# Patient Record
Sex: Female | Born: 1960 | Race: White | Hispanic: No | Smoking: Current every day smoker
Health system: Southern US, Community
[De-identification: ages and names within clinical notes are randomized; demographics above are authoritative.]

## PROBLEM LIST (undated history)

## (undated) DIAGNOSIS — E7800 Pure hypercholesterolemia, unspecified: Secondary | ICD-10-CM

## (undated) DIAGNOSIS — E119 Type 2 diabetes mellitus without complications: Secondary | ICD-10-CM

## (undated) DIAGNOSIS — F17200 Nicotine dependence, unspecified, uncomplicated: Secondary | ICD-10-CM

## (undated) DIAGNOSIS — I10 Essential (primary) hypertension: Secondary | ICD-10-CM

## (undated) DIAGNOSIS — L732 Hidradenitis suppurativa: Secondary | ICD-10-CM

## (undated) HISTORY — DX: Hidradenitis suppurativa: L73.2

## (undated) HISTORY — DX: Nicotine dependence, unspecified, uncomplicated: F17.200

---

## 2002-12-31 ENCOUNTER — Emergency Department (HOSPITAL_COMMUNITY): Admission: EM | Admit: 2002-12-31 | Discharge: 2002-12-31 | Payer: Self-pay | Admitting: *Deleted

## 2007-02-16 ENCOUNTER — Emergency Department (HOSPITAL_COMMUNITY): Admission: EM | Admit: 2007-02-16 | Discharge: 2007-02-16 | Payer: Self-pay | Admitting: Emergency Medicine

## 2007-04-22 ENCOUNTER — Ambulatory Visit (HOSPITAL_COMMUNITY): Admission: RE | Admit: 2007-04-22 | Discharge: 2007-04-22 | Payer: Self-pay | Admitting: Family Medicine

## 2007-05-07 ENCOUNTER — Ambulatory Visit (HOSPITAL_COMMUNITY): Admission: RE | Admit: 2007-05-07 | Discharge: 2007-05-07 | Payer: Self-pay | Admitting: Family Medicine

## 2008-01-21 ENCOUNTER — Emergency Department (HOSPITAL_COMMUNITY): Admission: EM | Admit: 2008-01-21 | Discharge: 2008-01-21 | Payer: Self-pay | Admitting: Emergency Medicine

## 2008-01-29 ENCOUNTER — Other Ambulatory Visit: Admission: RE | Admit: 2008-01-29 | Discharge: 2008-01-29 | Payer: Self-pay | Admitting: Obstetrics & Gynecology

## 2008-05-17 ENCOUNTER — Ambulatory Visit (HOSPITAL_COMMUNITY): Admission: RE | Admit: 2008-05-17 | Discharge: 2008-05-17 | Payer: Self-pay | Admitting: Obstetrics & Gynecology

## 2009-05-17 ENCOUNTER — Other Ambulatory Visit: Admission: RE | Admit: 2009-05-17 | Discharge: 2009-05-17 | Payer: Self-pay | Admitting: Obstetrics & Gynecology

## 2010-06-26 ENCOUNTER — Other Ambulatory Visit: Admission: RE | Admit: 2010-06-26 | Discharge: 2010-06-26 | Payer: Self-pay | Admitting: Obstetrics & Gynecology

## 2010-10-22 ENCOUNTER — Encounter: Payer: Self-pay | Admitting: Family Medicine

## 2011-06-26 LAB — DIFFERENTIAL
Basophils Absolute: 0
Eosinophils Relative: 2
Lymphocytes Relative: 20
Lymphs Abs: 2.4
Neutro Abs: 8.4 — ABNORMAL HIGH
Neutrophils Relative %: 71

## 2011-06-26 LAB — WET PREP, GENITAL
Trich, Wet Prep: NONE SEEN
Yeast Wet Prep HPF POC: NONE SEEN

## 2011-06-26 LAB — GC/CHLAMYDIA PROBE AMP, GENITAL
Chlamydia, DNA Probe: NEGATIVE
GC Probe Amp, Genital: NEGATIVE

## 2011-06-26 LAB — CBC
HCT: 28.9 — ABNORMAL LOW
Platelets: 509 — ABNORMAL HIGH
WBC: 11.8 — ABNORMAL HIGH

## 2011-06-26 LAB — PREGNANCY, URINE: Preg Test, Ur: NEGATIVE

## 2011-06-29 ENCOUNTER — Other Ambulatory Visit: Payer: Self-pay | Admitting: Obstetrics & Gynecology

## 2011-06-29 ENCOUNTER — Other Ambulatory Visit (HOSPITAL_COMMUNITY)
Admission: RE | Admit: 2011-06-29 | Discharge: 2011-06-29 | Disposition: A | Discharge status: Home / Self Care | Payer: Medicaid Other | Source: Ambulatory Visit | Attending: Obstetrics & Gynecology | Admitting: Obstetrics & Gynecology

## 2011-06-29 DIAGNOSIS — Z01419 Encounter for gynecological examination (general) (routine) without abnormal findings: Secondary | ICD-10-CM | POA: Insufficient documentation

## 2011-07-16 ENCOUNTER — Other Ambulatory Visit: Payer: Self-pay | Admitting: Obstetrics & Gynecology

## 2011-07-16 DIAGNOSIS — Z139 Encounter for screening, unspecified: Secondary | ICD-10-CM

## 2011-07-23 ENCOUNTER — Ambulatory Visit (HOSPITAL_COMMUNITY)
Admission: RE | Admit: 2011-07-23 | Discharge: 2011-07-23 | Disposition: A | Discharge status: Home / Self Care | Payer: Medicaid Other | Source: Ambulatory Visit | Attending: Obstetrics & Gynecology | Admitting: Obstetrics & Gynecology

## 2011-07-23 DIAGNOSIS — Z1231 Encounter for screening mammogram for malignant neoplasm of breast: Secondary | ICD-10-CM | POA: Insufficient documentation

## 2011-07-23 DIAGNOSIS — Z139 Encounter for screening, unspecified: Secondary | ICD-10-CM

## 2013-06-23 ENCOUNTER — Emergency Department (HOSPITAL_COMMUNITY): Payer: Medicaid Other

## 2013-06-23 ENCOUNTER — Encounter (HOSPITAL_COMMUNITY): Payer: Self-pay

## 2013-06-23 ENCOUNTER — Inpatient Hospital Stay (HOSPITAL_COMMUNITY)
Admission: EM | Admit: 2013-06-23 | Discharge: 2013-06-28 | DRG: 603 | Disposition: A | Discharge status: Home / Self Care | Payer: Medicaid Other | Attending: Internal Medicine | Admitting: Internal Medicine

## 2013-06-23 DIAGNOSIS — Z23 Encounter for immunization: Secondary | ICD-10-CM

## 2013-06-23 DIAGNOSIS — E785 Hyperlipidemia, unspecified: Secondary | ICD-10-CM | POA: Diagnosis present

## 2013-06-23 DIAGNOSIS — F17200 Nicotine dependence, unspecified, uncomplicated: Secondary | ICD-10-CM | POA: Diagnosis present

## 2013-06-23 DIAGNOSIS — L03039 Cellulitis of unspecified toe: Secondary | ICD-10-CM | POA: Diagnosis present

## 2013-06-23 DIAGNOSIS — L0390 Cellulitis, unspecified: Secondary | ICD-10-CM

## 2013-06-23 DIAGNOSIS — E119 Type 2 diabetes mellitus without complications: Secondary | ICD-10-CM | POA: Diagnosis present

## 2013-06-23 DIAGNOSIS — R112 Nausea with vomiting, unspecified: Secondary | ICD-10-CM

## 2013-06-23 DIAGNOSIS — Z720 Tobacco use: Secondary | ICD-10-CM | POA: Diagnosis present

## 2013-06-23 DIAGNOSIS — I10 Essential (primary) hypertension: Secondary | ICD-10-CM | POA: Diagnosis present

## 2013-06-23 DIAGNOSIS — D649 Anemia, unspecified: Secondary | ICD-10-CM | POA: Diagnosis present

## 2013-06-23 DIAGNOSIS — K5900 Constipation, unspecified: Secondary | ICD-10-CM

## 2013-06-23 DIAGNOSIS — E871 Hypo-osmolality and hyponatremia: Secondary | ICD-10-CM | POA: Diagnosis present

## 2013-06-23 DIAGNOSIS — Z794 Long term (current) use of insulin: Secondary | ICD-10-CM

## 2013-06-23 DIAGNOSIS — IMO0001 Reserved for inherently not codable concepts without codable children: Secondary | ICD-10-CM | POA: Diagnosis present

## 2013-06-23 DIAGNOSIS — L02619 Cutaneous abscess of unspecified foot: Principal | ICD-10-CM | POA: Diagnosis present

## 2013-06-23 DIAGNOSIS — R7309 Other abnormal glucose: Secondary | ICD-10-CM

## 2013-06-23 DIAGNOSIS — E7800 Pure hypercholesterolemia, unspecified: Secondary | ICD-10-CM | POA: Diagnosis present

## 2013-06-23 DIAGNOSIS — R739 Hyperglycemia, unspecified: Secondary | ICD-10-CM

## 2013-06-23 HISTORY — DX: Essential (primary) hypertension: I10

## 2013-06-23 HISTORY — DX: Pure hypercholesterolemia, unspecified: E78.00

## 2013-06-23 HISTORY — DX: Type 2 diabetes mellitus without complications: E11.9

## 2013-06-23 LAB — BASIC METABOLIC PANEL
CO2: 28 mEq/L (ref 19–32)
Calcium: 10 mg/dL (ref 8.4–10.5)
Creatinine, Ser: 0.79 mg/dL (ref 0.50–1.10)
GFR calc non Af Amer: >90 mL/min (ref >90)
Glucose, Bld: 242 mg/dL — ABNORMAL HIGH (ref 70–99)
Sodium: 126 mEq/L — ABNORMAL LOW (ref 135–145)

## 2013-06-23 LAB — URINALYSIS, ROUTINE W REFLEX MICROSCOPIC
Glucose, UA: 250 mg/dL — AB
Hgb urine dipstick: NEGATIVE
Ketones, ur: NEGATIVE mg/dL
Protein, ur: NEGATIVE mg/dL
Urobilinogen, UA: 0.2 mg/dL (ref 0.0–1.0)

## 2013-06-23 LAB — CBC WITH DIFFERENTIAL/PLATELET
Basophils Absolute: 0 10*3/uL (ref 0.0–0.1)
Eosinophils Absolute: 0.2 10*3/uL (ref 0.0–0.7)
Eosinophils Relative: 1 % (ref 0–5)
HCT: 38 % (ref 36.0–46.0)
MCH: 31.5 pg (ref 26.0–34.0)
MCV: 88 fL (ref 78.0–100.0)
Monocytes Absolute: 1.4 10*3/uL — ABNORMAL HIGH (ref 0.1–1.0)
Platelets: 337 10*3/uL (ref 150–400)
RDW: 11.6 % (ref 11.5–15.5)

## 2013-06-23 MED ORDER — SODIUM CHLORIDE 0.9 % IV SOLN: INTRAVENOUS | Status: DC

## 2013-06-23 MED ORDER — ACETAMINOPHEN 500 MG PO TABS
500.0000 mg | ORAL_TABLET | Freq: Four times a day (QID) | ORAL | Status: DC | PRN
  Administered 2013-06-25: 500 mg via ORAL
  Filled 2013-06-23: qty 1

## 2013-06-23 MED ORDER — PIPERACILLIN-TAZOBACTAM 3.375 G IVPB
INTRAVENOUS | Status: AC
  Filled 2013-06-23: qty 50

## 2013-06-23 MED ORDER — VANCOMYCIN HCL IN DEXTROSE 1-5 GM/200ML-% IV SOLN
1000.0000 mg | Freq: Once | INTRAVENOUS | Status: AC
  Administered 2013-06-23: 1000 mg via INTRAVENOUS
  Filled 2013-06-23: qty 200

## 2013-06-23 MED ORDER — MORPHINE SULFATE 4 MG/ML IJ SOLN
4.0000 mg | Freq: Once | INTRAMUSCULAR | Status: DC
  Filled 2013-06-23: qty 1

## 2013-06-23 MED ORDER — SODIUM CHLORIDE 0.9 % IV BOLUS (SEPSIS)
1000.0000 mL | Freq: Once | INTRAVENOUS | Status: AC
  Administered 2013-06-23: 1000 mL via INTRAVENOUS

## 2013-06-23 MED ORDER — GLIPIZIDE 5 MG PO TABS
5.0000 mg | ORAL_TABLET | Freq: Two times a day (BID) | ORAL | Status: DC
  Administered 2013-06-24 – 2013-06-28 (×9): 5 mg via ORAL
  Filled 2013-06-23 (×13): qty 1

## 2013-06-23 MED ORDER — VANCOMYCIN HCL IN DEXTROSE 1-5 GM/200ML-% IV SOLN
INTRAVENOUS | Status: AC
  Filled 2013-06-23: qty 200

## 2013-06-23 MED ORDER — ONDANSETRON HCL 4 MG PO TABS: 4.0000 mg | ORAL_TABLET | Freq: Four times a day (QID) | ORAL | Status: DC | PRN

## 2013-06-23 MED ORDER — INSULIN ASPART 100 UNIT/ML ~~LOC~~ SOLN
0.0000 [IU] | Freq: Every day | SUBCUTANEOUS | Status: DC
  Administered 2013-06-23 – 2013-06-25 (×2): 3 [IU] via SUBCUTANEOUS

## 2013-06-23 MED ORDER — ONDANSETRON HCL 4 MG/2ML IJ SOLN
4.0000 mg | Freq: Four times a day (QID) | INTRAMUSCULAR | Status: DC | PRN
  Administered 2013-06-24 – 2013-06-27 (×2): 4 mg via INTRAVENOUS
  Filled 2013-06-23 (×2): qty 2

## 2013-06-23 MED ORDER — ONDANSETRON HCL 4 MG/2ML IJ SOLN
4.0000 mg | Freq: Once | INTRAMUSCULAR | Status: AC
  Administered 2013-06-23: 4 mg via INTRAVENOUS
  Filled 2013-06-23: qty 2

## 2013-06-23 MED ORDER — PANTOPRAZOLE SODIUM 40 MG PO TBEC
40.0000 mg | DELAYED_RELEASE_TABLET | Freq: Every day | ORAL | Status: DC
  Administered 2013-06-24 – 2013-06-28 (×5): 40 mg via ORAL
  Filled 2013-06-23 (×5): qty 1

## 2013-06-23 MED ORDER — VANCOMYCIN HCL IN DEXTROSE 1-5 GM/200ML-% IV SOLN
1000.0000 mg | Freq: Two times a day (BID) | INTRAVENOUS | Status: DC
  Administered 2013-06-24 – 2013-06-28 (×9): 1000 mg via INTRAVENOUS
  Filled 2013-06-23 (×11): qty 200

## 2013-06-23 MED ORDER — SODIUM CHLORIDE 0.9 % IV BOLUS (SEPSIS): 500.0000 mL | Freq: Once | INTRAVENOUS | Status: DC

## 2013-06-23 MED ORDER — HEPARIN SODIUM (PORCINE) 5000 UNIT/ML IJ SOLN
5000.0000 [IU] | Freq: Three times a day (TID) | INTRAMUSCULAR | Status: DC
  Administered 2013-06-24: 5000 [IU] via SUBCUTANEOUS
  Filled 2013-06-23: qty 1

## 2013-06-23 MED ORDER — NICOTINE 21 MG/24HR TD PT24
21.0000 mg | MEDICATED_PATCH | Freq: Every day | TRANSDERMAL | Status: DC
  Administered 2013-06-24 – 2013-06-28 (×6): 21 mg via TRANSDERMAL
  Filled 2013-06-23 (×6): qty 1

## 2013-06-23 MED ORDER — SODIUM CHLORIDE 0.9 % IV SOLN
INTRAVENOUS | Status: AC
  Administered 2013-06-23: 20:00:00 via INTRAVENOUS

## 2013-06-23 MED ORDER — LISINOPRIL 10 MG PO TABS
20.0000 mg | ORAL_TABLET | Freq: Every day | ORAL | Status: DC
  Administered 2013-06-24 – 2013-06-28 (×5): 20 mg via ORAL
  Filled 2013-06-23 (×6): qty 2

## 2013-06-23 MED ORDER — ASPIRIN 325 MG PO TABS: 325.0000 mg | ORAL_TABLET | Freq: Every day | ORAL | Status: DC

## 2013-06-23 MED ORDER — ASPIRIN 325 MG PO TABS
325.0000 mg | ORAL_TABLET | Freq: Every day | ORAL | Status: DC
  Administered 2013-06-24 – 2013-06-28 (×5): 325 mg via ORAL
  Filled 2013-06-23 (×5): qty 1

## 2013-06-23 MED ORDER — PIPERACILLIN-TAZOBACTAM 3.375 G IVPB 30 MIN
3.3750 g | Freq: Once | INTRAVENOUS | Status: AC
  Administered 2013-06-23: 3.375 g via INTRAVENOUS
  Filled 2013-06-23 (×2): qty 50

## 2013-06-23 MED ORDER — INSULIN ASPART 100 UNIT/ML ~~LOC~~ SOLN
0.0000 [IU] | Freq: Three times a day (TID) | SUBCUTANEOUS | Status: DC
  Administered 2013-06-24 (×2): 2 [IU] via SUBCUTANEOUS
  Administered 2013-06-24 – 2013-06-27 (×7): 3 [IU] via SUBCUTANEOUS
  Administered 2013-06-27: 2 [IU] via SUBCUTANEOUS
  Administered 2013-06-28: 3 [IU] via SUBCUTANEOUS

## 2013-06-23 MED ORDER — LISINOPRIL-HYDROCHLOROTHIAZIDE 20-12.5 MG PO TABS: 1.0000 | ORAL_TABLET | Freq: Every day | ORAL | Status: DC

## 2013-06-23 MED ORDER — SIMVASTATIN 20 MG PO TABS
20.0000 mg | ORAL_TABLET | Freq: Every day | ORAL | Status: DC
  Administered 2013-06-24 – 2013-06-27 (×4): 20 mg via ORAL
  Filled 2013-06-23 (×6): qty 1

## 2013-06-23 MED ORDER — HYDROCHLOROTHIAZIDE 12.5 MG PO CAPS
12.5000 mg | ORAL_CAPSULE | Freq: Every day | ORAL | Status: DC
  Administered 2013-06-24 – 2013-06-28 (×5): 12.5 mg via ORAL
  Filled 2013-06-23 (×7): qty 1

## 2013-06-23 MED ORDER — INSULIN GLARGINE 100 UNIT/ML ~~LOC~~ SOLN
15.0000 [IU] | Freq: Every day | SUBCUTANEOUS | Status: DC
  Administered 2013-06-23 – 2013-06-27 (×5): 15 [IU] via SUBCUTANEOUS
  Filled 2013-06-23 (×6): qty 0.15

## 2013-06-23 MED ORDER — MORPHINE SULFATE 4 MG/ML IJ SOLN
4.0000 mg | Freq: Once | INTRAMUSCULAR | Status: AC
  Administered 2013-06-23: 4 mg via INTRAVENOUS

## 2013-06-23 MED ORDER — PANTOPRAZOLE SODIUM 40 MG PO TBEC: 40.0000 mg | DELAYED_RELEASE_TABLET | Freq: Every day | ORAL | Status: DC

## 2013-06-23 MED ORDER — GLIPIZIDE-METFORMIN HCL 5-500 MG PO TABS: 1.0000 | ORAL_TABLET | Freq: Two times a day (BID) | ORAL | Status: DC

## 2013-06-23 MED ORDER — OXYCODONE HCL 5 MG PO TABS
5.0000 mg | ORAL_TABLET | ORAL | Status: DC | PRN
  Administered 2013-06-24 – 2013-06-27 (×4): 5 mg via ORAL
  Filled 2013-06-23 (×4): qty 1

## 2013-06-23 MED ORDER — ONDANSETRON HCL 4 MG/2ML IJ SOLN: 4.0000 mg | Freq: Once | INTRAMUSCULAR | Status: DC

## 2013-06-23 MED ORDER — PIPERACILLIN-TAZOBACTAM 3.375 G IVPB
3.3750 g | Freq: Three times a day (TID) | INTRAVENOUS | Status: DC
  Administered 2013-06-24 – 2013-06-28 (×13): 3.375 g via INTRAVENOUS
  Filled 2013-06-23 (×17): qty 50

## 2013-06-23 MED ORDER — METFORMIN HCL 500 MG PO TABS
500.0000 mg | ORAL_TABLET | Freq: Two times a day (BID) | ORAL | Status: DC
  Administered 2013-06-24 – 2013-06-28 (×9): 500 mg via ORAL
  Filled 2013-06-23 (×9): qty 1

## 2013-06-23 NOTE — Progress Notes (Signed)
ANTIBIOTIC CONSULT NOTE - INITIAL  Pharmacy Consult for Vancomycin and Zosyn Indication: Cellulitis  No Known Allergies  Patient Measurements: Height: 5\' 5"  (165.1 cm) Weight: 160 lb (72.576 kg) IBW/kg (Calculated) : 57  Vital Signs: Temp: 98.2 F (36.8 C) (09/23 1708) Temp src: Oral (09/23 1708) BP: 112/56 mmHg (09/23 1925) Pulse Rate: 87 (09/23 1925) Intake/Output from previous day:   Intake/Output from this shift: Total I/O In: 1000 [I.V.:1000] Out: -   Labs:  Recent Labs  06/23/13 1750  WBC 21.2*  HGB 13.6  PLT 337  CREATININE 0.79   Estimated Creatinine Clearance: 82.1 ml/min (by C-G formula based on Cr of 0.79). No results found for this basename: VANCOTROUGH, VANCOPEAK, VANCORANDOM, GENTTROUGH, GENTPEAK, GENTRANDOM, TOBRATROUGH, TOBRAPEAK, TOBRARND, AMIKACINPEAK, AMIKACINTROU, AMIKACIN,  in the last 72 hours   Microbiology: No results found for this or any previous visit (from the past 720 hour(s)).  Medical History: Past Medical History  Diagnosis Date  . Diabetes mellitus without complication   . Hypertension   . Hypercholesterolemia     Medications:  Scheduled:  . aspirin  325 mg Oral Daily  . glipiZIDE  5 mg Oral BID AC   And  . metFORMIN  500 mg Oral BID AC  . heparin  5,000 Units Subcutaneous Q8H  . [START ON 06/24/2013] lisinopril  20 mg Oral Daily   And  . [START ON 06/24/2013] hydrochlorothiazide  12.5 mg Oral Daily  . [START ON 06/24/2013] insulin aspart  0-15 Units Subcutaneous TID WC  . insulin aspart  0-5 Units Subcutaneous QHS  . insulin glargine  15 Units Subcutaneous QHS  . pantoprazole  40 mg Oral Daily  . simvastatin  20 mg Oral q1800   Assessment: Okay for Protocol Patient Received initial doses in the ED.  Zosyn 9/23 >> Vancomycin 9/23 >>  Goal of Therapy:  Vancomycin trough level 10-15 mcg/ml  Plan:  Zosyn 3.375 GM IV every 8 hours. Vancomycin 1000mg  IV every 12 hours. Measure antibiotic drug levels at steady  state Follow up culture results  Mady Gemma 06/23/2013,8:02 PM

## 2013-06-23 NOTE — ED Provider Notes (Signed)
CSN: [id number]     Arrival date & time 06/23/13  1648 History   First MD Initiated Contact with Patient 06/23/13 1720     Chief Complaint  Patient presents with  . Foot Pain   (Consider location/radiation/quality/duration/timing/severity/associated sxs/prior Treatment) HPI Comments: [name] is a 52 y.o. Female who presents for evaluation of a red and swollen left great toe. Symptoms started several days ago, without trauma. She denies fever, chills, nausea or vomiting. Her CBG has been running in the 200s. She is taking her usual medicines, without relief. There are no other known modifying factors.  Patient is a 52 y.o. female presenting with lower extremity pain. The history is provided by the patient.  Foot Pain    Past Medical History  Diagnosis Date  . Diabetes mellitus without complication   . Hypertension   . Hypercholesterolemia    History reviewed. No pertinent past surgical history. No family history on file. History  Substance Use Topics  . Smoking status: Current Every Day Smoker  . Smokeless tobacco: Not on file  . Alcohol Use: No   OB History   Grav Para Term Preterm Abortions TAB SAB Ect Mult Living                 Review of Systems  All other systems reviewed and are negative.    Allergies  Review of patient's allergies indicates no known allergies.  Home Medications   Current Outpatient Rx  Name  Route  Sig  Dispense  Refill  . acetaminophen (TYLENOL) 500 MG tablet   Oral   Take 500 mg by mouth every 6 (six) hours as needed for pain.         Marland Kitchen aspirin 325 MG tablet   Oral   Take 325 mg by mouth daily.         Marland Kitchen glipiZIDE-metformin (METAGLIP) 5-500 MG per tablet   Oral   Take 1 tablet by mouth 2 (two) times daily.         . insulin glargine (LANTUS) 100 UNIT/ML injection   Subcutaneous   Inject 15 Units into the skin at bedtime.         Marland Kitchen lisinopril-hydrochlorothiazide (PRINZIDE,ZESTORETIC) 20-12.5 MG per tablet    Oral   Take 1 tablet by mouth daily.         Marland Kitchen omeprazole (PRILOSEC) 20 MG capsule   Oral   Take 20 mg by mouth daily.         . pravastatin (PRAVACHOL) 40 MG tablet   Oral   Take 40 mg by mouth every evening.          BP 149/68  Pulse 113  Temp(Src) 98.2 F (36.8 C) (Oral)  Resp 18  Ht 5\' 5"  (1.651 m)  Wt 160 lb (72.576 kg)  BMI 26.63 kg/m2  SpO2 98% Physical Exam  Nursing note and vitals reviewed. Constitutional: She is oriented to person, place, and time. She appears well-developed and well-nourished.  HENT:  Head: Normocephalic and atraumatic.  Eyes: Conjunctivae and EOM are normal. Pupils are equal, round, and reactive to light.  Neck: Normal range of motion and phonation normal. Neck supple.  Cardiovascular: Normal rate, regular rhythm and intact distal pulses.   Pulmonary/Chest: Effort normal and breath sounds normal. She exhibits no tenderness.  Abdominal: Soft. She exhibits no distension. There is no tenderness. There is no guarding.  Musculoskeletal: Normal range of motion.  Left great toe is swollen and erythematous. There is mild to  moderate swelling and redness of the left forefoot.  Neurological: She is alert and oriented to person, place, and time. She has normal strength. She exhibits normal muscle tone.  Skin: Skin is warm and dry.  There is proximal streaking radiating from the left great toe to the left knee consistent with lymphangitis  Psychiatric: She has a normal mood and affect. Her behavior is normal. Judgment and thought content normal.    ED Course  Procedures (including critical care time)  Medications  0.9 %  sodium chloride infusion (not administered)  vancomycin (VANCOCIN) IVPB 1000 mg/200 mL premix (1,000 mg Intravenous New Bag/Given 06/23/13 1807)  sodium chloride 0.9 % bolus 1,000 mL (1,000 mLs Intravenous New Bag/Given 06/23/13 1800)  morphine 4 MG/ML injection 4 mg (4 mg Intravenous Given 06/23/13 1800)  ondansetron (ZOFRAN)  injection 4 mg (4 mg Intravenous Given 06/23/13 1800)  piperacillin-tazobactam (ZOSYN) IVPB 3.375 g (3.375 g Intravenous New Bag/Given 06/23/13 1807)    Patient Vitals for the past 24 hrs:  BP Temp Temp src Pulse Resp SpO2 Height Weight  06/23/13 1708 149/68 mmHg 98.2 F (36.8 C) Oral 113 18 98 % 5\' 5"  (1.651 m) 160 lb (72.576 kg)      Labs Review Labs Reviewed  CBC WITH DIFFERENTIAL - Abnormal; Notable for the following:    WBC 21.2 (*)    Neutrophils Relative % 82 (*)    Neutro Abs 17.4 (*)    Lymphocytes Relative 11 (*)    Monocytes Absolute 1.4 (*)    All other components within normal limits  BASIC METABOLIC PANEL - Abnormal; Notable for the following:    Sodium 126 (*)    Chloride 86 (*)    Glucose, Bld 242 (*)    All other components within normal limits  URINALYSIS, ROUTINE W REFLEX MICROSCOPIC - Abnormal; Notable for the following:    Glucose, UA 250 (*)    All other components within normal limits   Imaging Review Dg Foot 2 Views Left  06/23/2013   CLINICAL DATA:  Erythema and swelling of the great toe of the left foot.  EXAM: LEFT FOOT - 2 VIEW  COMPARISON:  None.  FINDINGS: Great toe unremarkable. Sclerotic lesion medially in the middle phalanx 3rd toe appears benign, possibly a bone island or ossified fibrous cortical defect.  IMPRESSION: No significant findings.   Electronically Signed   By: Herbie Baltimore   On: 06/23/2013 18:30    MDM   1. Cellulitis   2. Hyperglycemia    Left foot infection and hyperglycemia, consistent with "diabetic". She is hemodynamically stable. She will need to be admitted for observation, stabilization. She does not appear to need an urgent surgical procedure, and does not appear to have osteomyelitis on evaluation.    Nursing Notes Reviewed/ Care Coordinated, and agree without changes. Applicable Imaging Reviewed.  Interpretation of Laboratory Data incorporated into ED treatment  Plan: Admit    Flint Melter, MD 06/24/13  1746

## 2013-06-23 NOTE — ED Notes (Signed)
Pt reports left great toe red and swollen.  Redness spreading up the top of her foot.  Denies injury.  Reports history of diabetes.  Pt says cbg at home today was in the 200s.

## 2013-06-23 NOTE — H&P (Signed)
[facility] History and Physical  DORETHY TOMEY ZOX:[id number] DOB: Apr 01, 1961 DOA: 06/23/2013  Referring physician: Dr. Effie Shy, ER. PCP: No primary provider on file.    Chief Complaint: Left foot redness and pain.  HPI: [name] is a 52 y.o. female who is diabetic and describes left foot redness and pain for the last several days. There was no associated trauma that she can remember. She does not have any clear history of neuropathy of her feet or legs. She has not been feeling feverish. She is a smoker. When she was evaluated in the emergency room, she was felt to have significant cellulitis that required admission. She is now being admitted for this.   Review of Systems: .  Apart from history of present illness, other systems are negative.  Past Medical History  Diagnosis Date  . Diabetes mellitus without complication   . Hypertension   . Hypercholesterolemia    History reviewed. No pertinent past surgical history. Social History:  She is a widower. Lives with her son. She continues to smoke 10 cigarettes per day. She does not drink alcohol. She is normally fully independent.  No Known Allergies  No family history on file. no family history of note.  Prior to Admission medications   Medication Sig Start Date End Date Taking? Authorizing Provider  acetaminophen (TYLENOL) 500 MG tablet Take 500 mg by mouth every 6 (six) hours as needed for pain.   Yes Historical Provider, MD  aspirin 325 MG tablet Take 325 mg by mouth daily.   Yes Historical Provider, MD  glipiZIDE-metformin (METAGLIP) 5-500 MG per tablet Take 1 tablet by mouth 2 (two) times daily.   Yes Historical Provider, MD  insulin glargine (LANTUS) 100 UNIT/ML injection Inject 15 Units into the skin at bedtime.   Yes Historical Provider, MD  lisinopril-hydrochlorothiazide (PRINZIDE,ZESTORETIC) 20-12.5 MG per tablet Take 1 tablet by mouth daily.   Yes Historical Provider, MD  omeprazole (PRILOSEC) 20 MG  capsule Take 20 mg by mouth daily.   Yes Historical Provider, MD  pravastatin (PRAVACHOL) 40 MG tablet Take 40 mg by mouth every evening.   Yes Historical Provider, MD   Physical Exam: Filed Vitals:   06/23/13 1925  BP: 112/56  Pulse: 87  Temp:   Resp: 22     General:  She looks systemically well. She is not toxic or septic. She does look clinically dehydrated.  Eyes: No pallor. No jaundice.  ENT: No abnormalities.  Neck: No neck lymphadenopathy.  Cardiovascular: Heart sounds are present in sinus rhythm without any murmurs or added sounds.  Respiratory: Lung fields are clear.  Abdomen: Soft, nontender. No hepatosplenomegaly.  Skin: Cellulitis from the left big toe extending up the foot and into the mid lower leg.  Musculoskeletal: No acute joint abnormalities.  Psychiatric: Appropriate affect.  Neurologic: Alert and orientated without any focal neurologic signs.  Labs on Admission:  Basic Metabolic Panel:  Recent Labs Lab 06/23/13 1750  NA 126*  K 3.8  CL 86*  CO2 28  GLUCOSE 242*  BUN 8  CREATININE 0.79  CALCIUM 10.0       CBC:  Recent Labs Lab 06/23/13 1750  WBC 21.2*  NEUTROABS 17.4*  HGB 13.6  HCT 38.0  MCV 88.0  PLT 337      Radiological Exams on Admission: Dg Foot 2 Views Left  06/23/2013   CLINICAL DATA:  Erythema and swelling of the great toe of the left foot.  EXAM: LEFT FOOT - 2 VIEW  COMPARISON:  None.  FINDINGS: Great toe unremarkable. Sclerotic lesion medially in the middle phalanx 3rd toe appears benign, possibly a bone island or ossified fibrous cortical defect.  IMPRESSION: No significant findings.   Electronically Signed   By: Herbie Baltimore   On: 06/23/2013 18:30      Assessment/Plan   1. Left foot cellulitis, unlikely to improve with outpatient treatment. 2. Type 2 diabetes mellitus. 3. Hypertension. 4. Hyperlipidemia. 5. Tobacco abuse, ongoing. 6. 6. Hyponatremia secondary to dehydration and uncontrolled  diabetes as well as ACE inhibitor and thiazide diuretic.  Plan: 1. Admit to medical floor. 2. Intravenous antibiotics. 3. Continue with diabetic medications and sliding scale of insulin. 4. Intravenous fluids.   Further recommendations will depend on patient's hospital progress.  Code Status: Full code.  Family Communication: Discussed plan with patient at the bedside.   Disposition Plan: Home when medically stable.  Time spent: 60 minutes.  Wilson Singer [facility] Pager [phone].  If 7PM-7AM, please contact night-coverage [email or web address] Password [facility] 06/23/2013, 7:53 PM

## 2013-06-23 NOTE — ED Notes (Signed)
Pt ambulated to restroom & returned to room w/ no complications. 

## 2013-06-23 NOTE — Progress Notes (Signed)
Pt caught smoking in the bathroom. Carton of cigarettes in her purse were removed from the room. Pt educated on smoking hazards and MD paged for nicotine patch. Pt stated she wanted to leave the hospital. Reeducated on importance of medical care and the plan of care for her during her admission. Patient agreed to stay for treatment.

## 2013-06-23 NOTE — ED Notes (Signed)
Patient states that her foot was hurting but they gave her medicine and its feels better. Having some wheezing right now.

## 2013-06-24 DIAGNOSIS — L0291 Cutaneous abscess, unspecified: Secondary | ICD-10-CM

## 2013-06-24 LAB — COMPREHENSIVE METABOLIC PANEL
ALT: 10 U/L (ref 0–35)
AST: 10 U/L (ref 0–37)
Alkaline Phosphatase: 145 U/L — ABNORMAL HIGH (ref 39–117)
CO2: 27 mEq/L (ref 19–32)
Calcium: 9.4 mg/dL (ref 8.4–10.5)
Chloride: 98 mEq/L (ref 96–112)
Creatinine, Ser: 0.78 mg/dL (ref 0.50–1.10)
GFR calc non Af Amer: >90 mL/min (ref >90)
Potassium: 3.8 mEq/L (ref 3.5–5.1)
Sodium: 134 mEq/L — ABNORMAL LOW (ref 135–145)
Total Bilirubin: 0.3 mg/dL (ref 0.3–1.2)

## 2013-06-24 LAB — CBC
HCT: 33.4 % — ABNORMAL LOW (ref 36.0–46.0)
Hemoglobin: 11.7 g/dL — ABNORMAL LOW (ref 12.0–15.0)
MCH: 31.1 pg (ref 26.0–34.0)
MCHC: 35 g/dL (ref 30.0–36.0)
RDW: 11.7 % (ref 11.5–15.5)
WBC: 14.1 10*3/uL — ABNORMAL HIGH (ref 4.0–10.5)

## 2013-06-24 LAB — GLUCOSE, CAPILLARY
Glucose-Capillary: 151 mg/dL — ABNORMAL HIGH (ref 70–99)
Glucose-Capillary: 137 mg/dL — ABNORMAL HIGH (ref 70–99)

## 2013-06-24 MED ORDER — ENOXAPARIN SODIUM 40 MG/0.4ML ~~LOC~~ SOLN
40.0000 mg | SUBCUTANEOUS | Status: DC
  Administered 2013-06-24 – 2013-06-27 (×4): 40 mg via SUBCUTANEOUS
  Filled 2013-06-24 (×4): qty 0.4

## 2013-06-24 MED ORDER — BISACODYL 5 MG PO TBEC
5.0000 mg | DELAYED_RELEASE_TABLET | Freq: Once | ORAL | Status: AC
  Administered 2013-06-24: 5 mg via ORAL
  Filled 2013-06-24: qty 1

## 2013-06-24 MED ORDER — INFLUENZA VAC SPLIT QUAD 0.5 ML IM SUSP
0.5000 mL | INTRAMUSCULAR | Status: AC
  Administered 2013-06-25: 0.5 mL via INTRAMUSCULAR
  Filled 2013-06-24: qty 0.5

## 2013-06-24 MED ORDER — PNEUMOCOCCAL VAC POLYVALENT 25 MCG/0.5ML IJ INJ
0.5000 mL | INJECTION | INTRAMUSCULAR | Status: AC
  Administered 2013-06-25: 0.5 mL via INTRAMUSCULAR
  Filled 2013-06-24: qty 0.5

## 2013-06-24 NOTE — Progress Notes (Signed)
UR chart review completed.  

## 2013-06-24 NOTE — Progress Notes (Signed)
[facility] PROGRESS NOTE  [name] JXB:[id number] DOB: 1960-12-28 DOA: 06/23/2013 PCP: No primary provider on file.  Assessment/Plan  Left foot cellulitis, mild improvement since yesterday -  Continue vancomycin and Zosyn  Diabetes mellitus type 2, morning CBG on 128 -  Continue Lantus 15 units -  Continue aspart sliding scale moderate dose with each bedtime insulin -  Trend CBGs  Hypertension/HLD, blood pressures controlled -  Continue lisinopril and hydrochlorothiazide -  Continue simvastatin  Cigarette abuse and dependence with nicotine withdrawal -  Nicotine patch 21 -  Counseled smoking cessation  Leukocytosis, trending down  Normocytic anemia, likely marrow suppression from acute illness and some hemodilution from IV fluids -  Repeat CBC in the morning  Hyponatremia, resolving with IV fluids. - Continue IV fluids  Diet:  Diabetic Access:  PIV IVF:  Yes Proph:  Lovenox  Code Status: Full Family Communication: Patient alone Disposition Plan: Pending improvement in cellulitis   Consultants:  None  Procedures:  X-ray left foot 2 view  Antibiotics:  Vancomycin from 9/23  Zosyn from 9/23   HPI/Subjective:  Patient states that the pain of her left big toe and dorsum of her left foot has improved somewhat, however it still feels hot, tender. The erythema has diminished mildly but still extends to the line written by the physician yesterday.  She denies fevers, chills, nausea, vomiting  Objective: Filed Vitals:   06/23/13 1708 06/23/13 1925 06/23/13 2027  BP: 149/68 112/56 110/68  Pulse: 113 87 98  Temp: 98.2 F (36.8 C)  98.4 F (36.9 C)  TempSrc: Oral  Oral  Resp: 18 22 20   Height: 5\' 5"  (1.651 m)    Weight: 72.576 kg (160 lb)    SpO2: 98% 96% 94%    Intake/Output Summary (Last 24 hours) at 06/24/13 1126 Last data filed at 06/24/13 1308  Gross per 24 hour  Intake   1520 ml  Output      0 ml  Net   1520 ml   Filed Weights    06/23/13 1708  Weight: 72.576 kg (160 lb)    Exam:   General:  Caucasian female, No acute distress  HEENT:  NCAT, MMM  Cardiovascular:  RRR, nl S1, S2 no mrg, 2+ pulses, warm extremities  Respiratory:  Diminished bilateral breath sounds with no focal wheezes, rhonchi or rales, no increased WOB  Abdomen:   NABS, soft, NT/ND  MSK:   Normal tone and bulk, 1+ left lower extremity edema of the top of the foot  Skin: Erythema that extends to just below the left knee and down to the toes, most prominent along the first toe and the dorsum of the foot along the first ray.  The toenail of the first toe of the left foot has an area which is oozing some serous and semi-purulent material.  Warm and tender to palpation particularly along the toe  Neuro:  Grossly intact  Data Reviewed: Basic Metabolic Panel:  Recent Labs Lab 06/23/13 1750 06/24/13 0457  NA 126* 134*  K 3.8 3.8  CL 86* 98  CO2 28 27  GLUCOSE 242* 115*  BUN 8 7  CREATININE 0.79 0.78  CALCIUM 10.0 9.4   Liver Function Tests:  Recent Labs Lab 06/24/13 0457  AST 10  ALT 10  ALKPHOS 145*  BILITOT 0.3  PROT 6.6  ALBUMIN 3.0*   No results found for this basename: LIPASE, AMYLASE,  in the last 168 hours No results found for this basename: AMMONIA,  in the last 168 hours CBC:  Recent Labs Lab 06/23/13 1750 06/24/13 0457  WBC 21.2* 14.1*  NEUTROABS 17.4*  --   HGB 13.6 11.7*  HCT 38.0 33.4*  MCV 88.0 88.8  PLT 337 304   Cardiac Enzymes: No results found for this basename: CKTOTAL, CKMB, CKMBINDEX, TROPONINI,  in the last 168 hours BNP (last 3 results) No results found for this basename: PROBNP,  in the last 8760 hours CBG:  Recent Labs Lab 06/23/13 2146 06/24/13 0726  GLUCAP 256* 128*    No results found for this or any previous visit (from the past 240 hour(s)).   Studies: Dg Foot 2 Views Left  06/23/2013   CLINICAL DATA:  Erythema and swelling of the great toe of the left foot.  EXAM:  LEFT FOOT - 2 VIEW  COMPARISON:  None.  FINDINGS: Great toe unremarkable. Sclerotic lesion medially in the middle phalanx 3rd toe appears benign, possibly a bone island or ossified fibrous cortical defect.  IMPRESSION: No significant findings.   Electronically Signed   By: [name]   On: 06/23/2013 18:30    Scheduled Meds: . aspirin  325 mg Oral Daily  . glipiZIDE  5 mg Oral BID AC   And  . metFORMIN  500 mg Oral BID AC  . heparin  5,000 Units Subcutaneous Q8H  . lisinopril  20 mg Oral Daily   And  . hydrochlorothiazide  12.5 mg Oral Daily  . [START ON 06/25/2013] influenza vac split quadrivalent PF  0.5 mL Intramuscular Tomorrow-1000  . insulin aspart  0-15 Units Subcutaneous TID WC  . insulin aspart  0-5 Units Subcutaneous QHS  . insulin glargine  15 Units Subcutaneous QHS  . nicotine  21 mg Transdermal Daily  . pantoprazole  40 mg Oral Daily  . piperacillin-tazobactam (ZOSYN)  IV  3.375 g Intravenous Q8H  . [START ON 06/25/2013] pneumococcal 23 valent vaccine  0.5 mL Intramuscular Tomorrow-1000  . simvastatin  20 mg Oral q1800  . vancomycin  1,000 mg Intravenous Q12H   Continuous Infusions: . sodium chloride      Active Problems:   Cellulitis and abscess of foot   DM (diabetes mellitus)   HTN (hypertension)   Tobacco abuse   Hyperlipidemia    Time spent: 30 min    [name], [facility]  [facility] Pager [phone]. If 7PM-7AM, please contact night-coverage at [email or web address], password [facility] 06/24/2013, 11:26 AM  LOS: 1 day

## 2013-06-24 NOTE — Progress Notes (Signed)
ANTICOAGULATION CONSULT NOTE - Initial Consult  Pharmacy Consult for Lovenox Indication: VTE prophylaxis  No Known Allergies  Patient Measurements: Height: 5\' 5"  (165.1 cm) Weight: 160 lb (72.576 kg) IBW/kg (Calculated) : 57  Vital Signs:    Labs:  Recent Labs  06/23/13 1750 06/24/13 0457  HGB 13.6 11.7*  HCT 38.0 33.4*  PLT 337 304  CREATININE 0.79 0.78    Estimated Creatinine Clearance: 82.1 ml/min (by C-G formula based on Cr of 0.78).   Medical History: Past Medical History  Diagnosis Date  . Diabetes mellitus without complication   . Hypertension   . Hypercholesterolemia     Medications:  Scheduled:  . aspirin  325 mg Oral Daily  . enoxaparin (LOVENOX) injection  40 mg Subcutaneous Q24H  . glipiZIDE  5 mg Oral BID AC   And  . metFORMIN  500 mg Oral BID AC  . lisinopril  20 mg Oral Daily   And  . hydrochlorothiazide  12.5 mg Oral Daily  . [START ON 06/25/2013] influenza vac split quadrivalent PF  0.5 mL Intramuscular Tomorrow-1000  . insulin aspart  0-15 Units Subcutaneous TID WC  . insulin aspart  0-5 Units Subcutaneous QHS  . insulin glargine  15 Units Subcutaneous QHS  . nicotine  21 mg Transdermal Daily  . pantoprazole  40 mg Oral Daily  . piperacillin-tazobactam (ZOSYN)  IV  3.375 g Intravenous Q8H  . [START ON 06/25/2013] pneumococcal 23 valent vaccine  0.5 mL Intramuscular Tomorrow-1000  . simvastatin  20 mg Oral q1800  . vancomycin  1,000 mg Intravenous Q12H    Assessment: 52yo female admitted with cellulitis.  Asked to start Lovenox for VTE prophylaxis. Goal of Therapy:  VTE prophylaxis Monitor platelets by anticoagulation protocol: Yes   Plan:  Lovenox 40mg  SQ q24hrs  Valrie Hart A 06/24/2013,11:45 AM

## 2013-06-24 NOTE — Care Management Note (Signed)
    Page 1 of 1   06/24/2013     1:17:27 PM   CARE MANAGEMENT NOTE 06/24/2013  Patient:  [name], [name]   Account Number:  [id number]  Date Initiated:  06/24/2013  Documentation initiated by:  Sharrie Rothman  Subjective/Objective Assessment:   Pt admitted from home with cellulitis. Pt lives with her son and is independent with ADL's. Pt has follow up/PCP care at the [facility] and wishes to continue to receive care there.     Action/Plan:   No CM needs noted   Anticipated DC Date:  06/26/2013   Anticipated DC Plan:  HOME/SELF CARE      DC Planning Services  CM consult      Choice offered to / List presented to:             Status of service:  Completed, signed off Medicare Important Message given?   (If response is "NO", the following Medicare IM given date fields will be blank) Date Medicare IM given:   Date Additional Medicare IM given:    Discharge Disposition:  HOME/SELF CARE  Per UR Regulation:    If discussed at Long Length of Stay Meetings, dates discussed:    Comments:  06/24/13 1315 Arlyss Queen, RN BSN CM

## 2013-06-25 DIAGNOSIS — R112 Nausea with vomiting, unspecified: Secondary | ICD-10-CM

## 2013-06-25 LAB — GLUCOSE, CAPILLARY
Glucose-Capillary: 175 mg/dL — ABNORMAL HIGH (ref 70–99)
Glucose-Capillary: 194 mg/dL — ABNORMAL HIGH (ref 70–99)
Glucose-Capillary: 115 mg/dL — ABNORMAL HIGH (ref 70–99)
Glucose-Capillary: 260 mg/dL — ABNORMAL HIGH (ref 70–99)

## 2013-06-25 NOTE — Discharge Summary (Signed)
Physician Discharge Summary  [name] MVH:[id number] DOB: 1961/03/29 DOA: 06/23/2013  PCP: No primary provider on file.  Admit date: 06/23/2013 Discharge date: 06/28/2013  Recommendations for Outpatient Follow-up:  1. Follow up with primary care doctor in 1 week or sooner as needed for repeat examination of the left foot and diabetes management.   2. Follow up with podiatry in one month.    Discharge Diagnoses:  Active Problems:   Cellulitis and abscess of foot   DM (diabetes mellitus)   HTN (hypertension)   Tobacco abuse   Hyperlipidemia   Nausea and vomiting   Unspecified constipation   Discharge Condition: stable, improved   Diet recommendation: diabetic diet  Wt Readings from Last 3 Encounters:  06/23/13 72.576 kg (160 lb)    History of present illness:  [name] is a 52 y.o. female who is diabetic and describes left foot redness and pain for the last several days. There was no associated trauma that she can remember. She does not have any clear history of neuropathy of her feet or legs. She has not been feeling feverish. She is a smoker. When she was evaluated in the emergency room, she was felt to have significant cellulitis that required admission. She is now being admitted for this.  Hospital Course:   Left foot cellulitis with toe abscess.  She was started on vancomycin and Zosyn to cover MRSA,, and causes cellulitis as well as Pseudomonas due to her diabetes. She initially had a rapid improvement in the redness and swelling of her shin of her leg. Her recovery slowed, however and General surgery was consulted to drain what appeared to be some purulence from her toe. Her large toe was incised and drained on September 26. After this procedure, her toe swelling rapidly improved, her pain started to resolve, and the redness slowly receded. She continues to have some erythema, induration of the dorsum of the foot, however it appears markedly improved since her  admission. She should transition to oral antibiotics with doxycycline and ciprofloxacin to complete a 14 day course. She should followup with her primary care doctor to have an examination in the next week. She should continue warm compresses to her foot a may use soapy water to clean the area and encouraged the abscess to continue to drain.     Nausea and vomiting, resolved and tolerated breakfast. May be related to antibiotics or infection.  She may continue Phenergan as needed for nausea and vomiting.  Diabetes mellitus type 2, CBG well controlled.  She may resume her home insulin. She got her primary care Doctor about diabetes management because her hemoglobin A1c is above goal. Lab Results  Component Value Date   HGBA1C 8.5* 06/26/2013    Hypertension/HLD, blood pressures controlled.  She continued her blood pressure medications and cholesterol medications.   Cigarette abuse and dependence with nicotine withdrawal.  She was given a nicotine patch and counseled to stop smoking.  Leukocytosis, resolved with antibiotics. Normocytic anemia, likely marrow suppression from acute illness and some hemodilution from IV fluids.  Resolved.  Hyponatremia was likely secondary to dehydration and resolved with IV fluids.   Consultants:  General surgery Procedures:  X-ray left foot 2 view Incision and drainage of left first toe on September 26 Antibiotics:  Vancomycin from 9/23  Zosyn from 9/23   Discharge Exam: Filed Vitals:   06/28/13 0545  BP: 110/65  Pulse: 80  Temp: 98 F (36.7 C)  Resp: 20   Filed Vitals:  06/27/13 1400 06/27/13 1500 06/27/13 2131 06/28/13 0545  BP:  141/81 113/70 110/65  Pulse:  88 72 80  Temp: 97.9 F (36.6 C) 98.1 F (36.7 C) 97.3 F (36.3 C) 98 F (36.7 C)  TempSrc: Oral  Oral Oral  Resp:  20 20 20   Height:      Weight:      SpO2:  97% 94% 99%   Patient states that her tone on the hurts. She is feeling much better.  General: Caucasian female, No  acute distress  HEENT: NCAT, MMM  Cardiovascular: RRR, nl S1, S2 no mrg, 2+ pulses, warm extremities  Respiratory: Diminished bilateral breath sounds with no focal wheezes, rhonchi or rales, no increased WOB  Abdomen: NABS, soft, NT/ND  MSK: Normal tone and bulk, 1+ left lower extremity edema of the top of the foot  Skin: Erythema resolved on shin. Some persistent erythema of the distal dorsum of the foot and of the first toe. The first toe appears much less swollen than it did yesterday or the day before. Neuro: Grossly intact   Discharge Instructions      Discharge Orders   Future Orders Complete By Expires   Call MD for:  difficulty breathing, headache or visual disturbances  As directed    Call MD for:  extreme fatigue  As directed    Call MD for:  hives  As directed    Call MD for:  persistant dizziness or light-headedness  As directed    Call MD for:  persistant nausea and vomiting  As directed    Call MD for:  redness, tenderness, or signs of infection (pain, swelling, redness, odor or green/yellow discharge around incision site)  As directed    Call MD for:  severe uncontrolled pain  As directed    Call MD for:  temperature >100.4  As directed    Diet Carb Modified  As directed    Discharge instructions  As directed    Comments:     You were hospitalized with a skin infection of your leg which likely started from your toe nail.  You were started on antibiotics and had an abscess drained from your left first toe.  You should make sure you talk with your primary care doctor about your diabetes and your skin infection.  (I have included a local clinic on your paperwork if you no longer have a primary care doctor or have problems getting an appointment.)  Please follow up with podiatry within 2-4 weeks also.  Please continue to take over the counter stool softeners if needed.  Your antibiotics may cause some stomach upset and doxycycline can cause you to sunburn extremely easily so  please avoid sunlight.  I have included some phenergan for nausea and a more gentle-on-the-stomach pain medication for you.  If the redness and swelling suddenly worsen or you develop fevers and chills, please return to the emergency department.   Discharge wound care:  As directed    Comments:     Dry dressings once daily or more often as needed.  Please continue warm compresses several times a day.   Increase activity slowly  As directed        Medication List         acetaminophen 500 MG tablet  Commonly known as:  TYLENOL  Take 500 mg by mouth every 6 (six) hours as needed for pain.     aspirin 325 MG tablet  Take 325 mg by mouth daily.  ciprofloxacin 750 MG tablet  Commonly known as:  CIPRO  Take 1 tablet (750 mg total) by mouth 2 (two) times daily.     doxycycline 100 MG tablet  Commonly known as:  VIBRA-TABS  Take 1 tablet (100 mg total) by mouth every 12 (twelve) hours.     glipiZIDE-metformin 5-500 MG per tablet  Commonly known as:  METAGLIP  Take 1 tablet by mouth 2 (two) times daily.     insulin glargine 100 UNIT/ML injection  Commonly known as:  LANTUS  Inject 15 Units into the skin at bedtime.     lisinopril-hydrochlorothiazide 20-12.5 MG per tablet  Commonly known as:  PRINZIDE,ZESTORETIC  Take 1 tablet by mouth daily.     nicotine 21 mg/24hr patch  Commonly known as:  NICODERM CQ - dosed in mg/24 hours  Place 1 patch onto the skin daily.     omeprazole 20 MG capsule  Commonly known as:  PRILOSEC  Take 20 mg by mouth daily.     pravastatin 40 MG tablet  Commonly known as:  PRAVACHOL  Take 40 mg by mouth every evening.     promethazine 12.5 MG tablet  Commonly known as:  PHENERGAN  Take 1 tablet (12.5 mg total) by mouth every 6 (six) hours as needed for nausea.     traMADol 50 MG tablet  Commonly known as:  ULTRAM  Take 1-2 tablets (50-100 mg total) by mouth every 6 (six) hours as needed for pain.       Follow-up Information   Follow up  with [facility]. Schedule an appointment as soon as possible for a visit in 1 week.   Contact information:   [address] [place] [facility] [address] [phone]       The results of significant diagnostics from this hospitalization (including imaging, microbiology, ancillary and laboratory) are listed below for reference.    Significant Diagnostic Studies: Dg Foot 2 Views Left  06/23/2013   CLINICAL DATA:  Erythema and swelling of the great toe of the left foot.  EXAM: LEFT FOOT - 2 VIEW  COMPARISON:  None.  FINDINGS: Great toe unremarkable. Sclerotic lesion medially in the middle phalanx 3rd toe appears benign, possibly a bone island or ossified fibrous cortical defect.  IMPRESSION: No significant findings.   Electronically Signed   By: Herbie Baltimore   On: 06/23/2013 18:30    Microbiology: Recent Results (from the past 240 hour(s))  CULTURE, ROUTINE-ABSCESS     Status: None   Collection Time    06/26/13 12:00 PM      Result Value Range Status   Specimen Description FOOT   Final   Special Requests Normal   Final   Gram Stain     Final   Value: NO WBC SEEN     NO SQUAMOUS EPITHELIAL CELLS SEEN     NO ORGANISMS SEEN     Performed at [facility]   Culture     Final   Value: NO GROWTH 1 DAY     Performed at [facility]   Report Status PENDING   Incomplete     Labs: Basic Metabolic Panel:  Recent Labs Lab 06/23/13 1750 06/24/13 0457 06/26/13 0452  NA 126* 134* 137  K 3.8 3.8 4.1  CL 86* 98 97  CO2 28 27 30   GLUCOSE 242* 115* 132*  BUN 8 7 7   CREATININE 0.79 0.78 1.02  CALCIUM 10.0 9.4 9.9   Liver Function Tests:  Recent Labs Lab 06/24/13 0457  AST 10  ALT 10  ALKPHOS 145*  BILITOT 0.3  PROT 6.6  ALBUMIN 3.0*   No results found for this basename: LIPASE, AMYLASE,  in the last 168 hours No results found for this basename: AMMONIA,  in the last 168 hours CBC:  Recent Labs Lab 06/23/13 1750 06/24/13 0457  06/26/13 0452  WBC 21.2* 14.1* 10.0  NEUTROABS 17.4*  --   --   HGB 13.6 11.7* 12.2  HCT 38.0 33.4* 36.2  MCV 88.0 88.8 90.3  PLT 337 304 344   Cardiac Enzymes: No results found for this basename: CKTOTAL, CKMB, CKMBINDEX, TROPONINI,  in the last 168 hours BNP: BNP (last 3 results) No results found for this basename: PROBNP,  in the last 8760 hours CBG:  Recent Labs Lab 06/27/13 1154 06/27/13 1412 06/27/13 1652 06/27/13 2112 06/28/13 0717  GLUCAP 133* 117* 195* 136* 171*    Time coordinating discharge: 45 minutes  Signed:  Jeniyah Menor  [facility] 06/28/2013, 9:59 AM

## 2013-06-25 NOTE — Progress Notes (Addendum)
[facility] PROGRESS NOTE  [name] ZOX:[id number] DOB: [birth year] DOA: 06/23/2013 PCP: No primary provider on file.  Assessment/Plan  Left foot cellulitis, mild improvement since yesterday, but large toenail no longer spontaneously draining -  Continue vancomycin and Zosyn  Nausea and vomiting, resolved and tolerated breakfast.  May be related to antibiotics or infection. -  Zofran prn  Diabetes mellitus type 2, CBG well controlled. -  Continue Lantus 15 units -  Continue aspart sliding scale moderate dose with each bedtime insulin  Hypertension/HLD, blood pressures controlled -  Continue lisinopril and hydrochlorothiazide -  Continue simvastatin  Cigarette abuse and dependence with nicotine withdrawal -  Nicotine patch 21 -  Counseled smoking cessation  Leukocytosis, trending down  Normocytic anemia, likely marrow suppression from acute illness and some hemodilution from IV fluids -  Repeat CBC in the morning  Hyponatremia, resolving with IV fluids. - d/c IVF - Repeat BMP in AM  Diet:  Diabetic Access:  PIV IVF:  Yes Proph:  Lovenox  Code Status: Full Family Communication: Patient alone Disposition Plan: Pending improvement in cellulitis, possibly home tomorrow   Consultants:  None  Procedures:  X-ray left foot 2 view  Antibiotics:  Vancomycin from 9/23  Zosyn from 9/23   HPI/Subjective:  Patient states that the pain of her left big toe and dorsum of her left foot has improved.  Had an episode of nausea and vomiting after dinner yesterday.    Objective: Filed Vitals:   06/23/13 2027 06/24/13 1234 06/24/13 2142 06/25/13 0500  BP: 110/68 143/58 115/63 97/82  Pulse: 98 98 88 82  Temp: 98.4 F (36.9 C) 98.3 F (36.8 C) 97.9 F (36.6 C) 97.3 F (36.3 C)  TempSrc: Oral Oral Oral Oral  Resp: 20 20 20 20   Height:      Weight:      SpO2: 94% 96% 95% 99%    Intake/Output Summary (Last 24 hours) at 06/25/13 0959 Last data filed at  06/25/13 0910  Gross per 24 hour  Intake   1440 ml  Output      0 ml  Net   1440 ml   Filed Weights   06/23/13 1708  Weight: 72.576 kg (160 lb)    Exam:   General:  Caucasian female, No acute distress  HEENT:  NCAT, MMM  Cardiovascular:  RRR, nl S1, S2 no mrg, 2+ pulses, warm extremities  Respiratory:  Diminished bilateral breath sounds with no focal wheezes, rhonchi or rales, no increased WOB  Abdomen:   NABS, soft, NT/ND  MSK:   Normal tone and bulk, 1+ left lower extremity edema of the top of the foot  Skin: Erythema resolved on shin.  Most prominent along the first toe and the dorsum of the foot along the first ray.  The toenail of the first toe of the left foot has possibly a lake of pus beneath it and does not appear to be draining anymore.  Warm and tender to palpation particularly along the toe.    Neuro:  Grossly intact  Data Reviewed: Basic Metabolic Panel:  Recent Labs Lab 06/23/13 1750 06/24/13 0457  NA 126* 134*  K 3.8 3.8  CL 86* 98  CO2 28 27  GLUCOSE 242* 115*  BUN 8 7  CREATININE 0.79 0.78  CALCIUM 10.0 9.4   Liver Function Tests:  Recent Labs Lab 06/24/13 0457  AST 10  ALT 10  ALKPHOS 145*  BILITOT 0.3  PROT 6.6  ALBUMIN 3.0*   No  results found for this basename: LIPASE, AMYLASE,  in the last 168 hours No results found for this basename: AMMONIA,  in the last 168 hours CBC:  Recent Labs Lab 06/23/13 1750 06/24/13 0457  WBC 21.2* 14.1*  NEUTROABS 17.4*  --   HGB 13.6 11.7*  HCT 38.0 33.4*  MCV 88.0 88.8  PLT 337 304   Cardiac Enzymes: No results found for this basename: CKTOTAL, CKMB, CKMBINDEX, TROPONINI,  in the last 168 hours BNP (last 3 results) No results found for this basename: PROBNP,  in the last 8760 hours CBG:  Recent Labs Lab 06/24/13 0726 06/24/13 1137 06/24/13 1621 06/24/13 2144 06/25/13 0738  GLUCAP 128* 151* 137* 137* 175*    No results found for this or any previous visit (from the past 240  hour(s)).   Studies: Dg Foot 2 Views Left  06/23/2013   CLINICAL DATA:  Erythema and swelling of the great toe of the left foot.  EXAM: LEFT FOOT - 2 VIEW  COMPARISON:  None.  FINDINGS: Great toe unremarkable. Sclerotic lesion medially in the middle phalanx 3rd toe appears benign, possibly a bone island or ossified fibrous cortical defect.  IMPRESSION: No significant findings.   Electronically Signed   By: Herbie Baltimore   On: 06/23/2013 18:30    Scheduled Meds: . aspirin  325 mg Oral Daily  . enoxaparin (LOVENOX) injection  40 mg Subcutaneous Q24H  . glipiZIDE  5 mg Oral BID AC   And  . metFORMIN  500 mg Oral BID AC  . lisinopril  20 mg Oral Daily   And  . hydrochlorothiazide  12.5 mg Oral Daily  . influenza vac split quadrivalent PF  0.5 mL Intramuscular Tomorrow-1000  . insulin aspart  0-15 Units Subcutaneous TID WC  . insulin aspart  0-5 Units Subcutaneous QHS  . insulin glargine  15 Units Subcutaneous QHS  . nicotine  21 mg Transdermal Daily  . pantoprazole  40 mg Oral Daily  . piperacillin-tazobactam (ZOSYN)  IV  3.375 g Intravenous Q8H  . pneumococcal 23 valent vaccine  0.5 mL Intramuscular Tomorrow-1000  . simvastatin  20 mg Oral q1800  . vancomycin  1,000 mg Intravenous Q12H   Continuous Infusions:    Active Problems:   Cellulitis and abscess of foot   DM (diabetes mellitus)   HTN (hypertension)   Tobacco abuse   Hyperlipidemia    Time spent: 30 min    Staphany Ditton, [facility]  [facility] Pager [phone]. If 7PM-7AM, please contact night-coverage at [email or web address], password [facility] 06/25/2013, 9:59 AM  LOS: 2 days

## 2013-06-26 ENCOUNTER — Encounter (HOSPITAL_COMMUNITY): Payer: Self-pay | Admitting: Internal Medicine

## 2013-06-26 DIAGNOSIS — K5900 Constipation, unspecified: Secondary | ICD-10-CM

## 2013-06-26 LAB — VANCOMYCIN, TROUGH: Vancomycin Tr: 16.6 ug/mL (ref 10.0–20.0)

## 2013-06-26 LAB — BASIC METABOLIC PANEL
BUN: 7 mg/dL (ref 6–23)
Calcium: 9.9 mg/dL (ref 8.4–10.5)
Chloride: 97 mEq/L (ref 96–112)
GFR calc Af Amer: 72 mL/min — ABNORMAL LOW (ref >90)
GFR calc non Af Amer: 62 mL/min — ABNORMAL LOW (ref >90)
Potassium: 4.1 mEq/L (ref 3.5–5.1)

## 2013-06-26 LAB — CBC
MCH: 30.4 pg (ref 26.0–34.0)
MCHC: 33.7 g/dL (ref 30.0–36.0)
MCV: 90.3 fL (ref 78.0–100.0)
Platelets: 344 10*3/uL (ref 150–400)
RBC: 4.01 MIL/uL (ref 3.87–5.11)

## 2013-06-26 LAB — GLUCOSE, CAPILLARY: Glucose-Capillary: 163 mg/dL — ABNORMAL HIGH (ref 70–99)

## 2013-06-26 MED ORDER — LIDOCAINE HCL (PF) 1 % IJ SOLN
INTRAMUSCULAR | Status: AC
  Filled 2013-06-26: qty 5

## 2013-06-26 MED ORDER — BISACODYL 5 MG PO TBEC: 5.0000 mg | DELAYED_RELEASE_TABLET | Freq: Every day | ORAL | Status: DC | PRN

## 2013-06-26 MED ORDER — SENNA 8.6 MG PO TABS
2.0000 | ORAL_TABLET | Freq: Every day | ORAL | Status: DC
  Administered 2013-06-26 – 2013-06-27 (×2): 17.2 mg via ORAL
  Filled 2013-06-26 (×2): qty 2

## 2013-06-26 MED ORDER — DOCUSATE SODIUM 100 MG PO CAPS
100.0000 mg | ORAL_CAPSULE | Freq: Two times a day (BID) | ORAL | Status: DC
  Administered 2013-06-26 – 2013-06-28 (×4): 100 mg via ORAL
  Filled 2013-06-26 (×4): qty 1

## 2013-06-26 MED ORDER — POLYETHYLENE GLYCOL 3350 17 G PO PACK: 17.0000 g | PACK | Freq: Every day | ORAL | Status: DC | PRN

## 2013-06-26 NOTE — Progress Notes (Addendum)
[facility] PROGRESS NOTE  [name] ZOX:[id number] DOB: 03/08/1961 DOA: 06/23/2013 PCP: No primary provider on file.  Assessment/Plan  Left foot cellulitis, about stable since yesterday and forming pus lake under nail -  Continue vancomycin and Zosyn -  General surgery consult to assist with draining toe abscess, appreciate assistance -  F/u wound culture  Nausea and vomiting, resolved and tolerated breakfast.  May be related to antibiotics or infection. -  Zofran prn  Diabetes mellitus type 2, CBG well controlled. -  Continue Lantus 15 units -  Continue aspart sliding scale moderate dose with each bedtime insulin -  A1c pending  Hypertension/HLD, blood pressures controlled -  Continue lisinopril and hydrochlorothiazide -  Continue simvastatin  Cigarette abuse and dependence with nicotine withdrawal -  Nicotine patch 21 -  Counseled smoking cessation  Leukocytosis, resolved.  Normocytic anemia, likely marrow suppression from acute illness and some hemodilution from IV fluids, resolved.  Hyponatremia, resolved with IV fluids.  Constipation  -  Start Colace, and senna, and MiraLax   Diet:  Diabetic Access:  PIV IVF:  Yes Proph:  Lovenox  Code Status: Full Family Communication: Patient alone Disposition Plan: Pending improvement in cellulitis, possibly home tomorrow   Consultants:  None  Procedures:  X-ray left foot 2 view  Antibiotics:  Vancomycin from 9/23  Zosyn from 9/23   HPI/Subjective:  Patient states that the pain of her left big toe and dorsum of her left foot is about the same.   Objective: Filed Vitals:   06/25/13 2050 06/26/13 0456 06/26/13 1119 06/26/13 1454  BP: 111/84 104/70 199/79 136/70  Pulse: 91 88  96  Temp: 97.1 F (36.2 C) 98 F (36.7 C)  98.3 F (36.8 C)  TempSrc: Oral Oral  Oral  Resp: 20 20    Height:      Weight:      SpO2: 97% 95%  95%    Intake/Output Summary (Last 24 hours) at 06/26/13 1656 Last  data filed at 06/26/13 1423  Gross per 24 hour  Intake    720 ml  Output      0 ml  Net    720 ml   Filed Weights   06/23/13 1708  Weight: 72.576 kg (160 lb)    Exam:   General:  Caucasian female, No acute distress  HEENT:  NCAT, MMM  Cardiovascular:  RRR, nl S1, S2 no mrg, 2+ pulses, warm extremities  Respiratory:  Diminished bilateral breath sounds with no focal wheezes, rhonchi or rales, no increased WOB  Abdomen:   NABS, soft, NT/ND  MSK:   Normal tone and bulk, 1+ left lower extremity edema of the top of the foot  Skin: Erythema resolved on shin.  Most prominent along the first toe and the dorsum of the foot along the first ray.  The toenail of the first toe of the left foot has possibly a lake of pus that has increased in size.    Neuro:  Grossly intact  Data Reviewed: Basic Metabolic Panel:  Recent Labs Lab 06/23/13 1750 06/24/13 0457 06/26/13 0452  NA 126* 134* 137  K 3.8 3.8 4.1  CL 86* 98 97  CO2 28 27 30   GLUCOSE 242* 115* 132*  BUN 8 7 7   CREATININE 0.79 0.78 1.02  CALCIUM 10.0 9.4 9.9   Liver Function Tests:  Recent Labs Lab 06/24/13 0457  AST 10  ALT 10  ALKPHOS 145*  BILITOT 0.3  PROT 6.6  ALBUMIN 3.0*  No results found for this basename: LIPASE, AMYLASE,  in the last 168 hours No results found for this basename: AMMONIA,  in the last 168 hours CBC:  Recent Labs Lab 06/23/13 1750 06/24/13 0457 06/26/13 0452  WBC 21.2* 14.1* 10.0  NEUTROABS 17.4*  --   --   HGB 13.6 11.7* 12.2  HCT 38.0 33.4* 36.2  MCV 88.0 88.8 90.3  PLT 337 304 344   Cardiac Enzymes: No results found for this basename: CKTOTAL, CKMB, CKMBINDEX, TROPONINI,  in the last 168 hours BNP (last 3 results) No results found for this basename: PROBNP,  in the last 8760 hours CBG:  Recent Labs Lab 06/25/13 1122 06/25/13 1616 06/25/13 2104 06/26/13 0725 06/26/13 1221  GLUCAP 194* 115* 260* 151* 98    No results found for this or any previous visit (from  the past 240 hour(s)).   Studies: No results found.  Scheduled Meds: . aspirin  325 mg Oral Daily  . docusate sodium  100 mg Oral BID  . enoxaparin (LOVENOX) injection  40 mg Subcutaneous Q24H  . glipiZIDE  5 mg Oral BID AC   And  . metFORMIN  500 mg Oral BID AC  . lisinopril  20 mg Oral Daily   And  . hydrochlorothiazide  12.5 mg Oral Daily  . insulin aspart  0-15 Units Subcutaneous TID WC  . insulin aspart  0-5 Units Subcutaneous QHS  . insulin glargine  15 Units Subcutaneous QHS  . lidocaine (PF)      . lidocaine (PF)      . nicotine  21 mg Transdermal Daily  . pantoprazole  40 mg Oral Daily  . piperacillin-tazobactam (ZOSYN)  IV  3.375 g Intravenous Q8H  . senna  2 tablet Oral QHS  . simvastatin  20 mg Oral q1800  . vancomycin  1,000 mg Intravenous Q12H   Continuous Infusions:    Active Problems:   Cellulitis and abscess of foot   DM (diabetes mellitus)   HTN (hypertension)   Tobacco abuse   Hyperlipidemia   Nausea and vomiting    Time spent: 30 min    Sharissa Brierley, [facility]  [facility] Pager [phone]. If 7PM-7AM, please contact night-coverage at [email or web address], password [facility] 06/26/2013, 4:56 PM  LOS: 3 days

## 2013-06-26 NOTE — Procedures (Signed)
Incision and Drainage Procedure Note  Pre-operative Diagnosis: Left great toe abscess  Post-operative Diagnosis: same  Indications: Patient presented to [facility] with a left toe and left foot erythema. Workup and evaluation was consistent for an abscess. Risks benefits alternatives incision and drainage were discussed with patient  Anesthesia: 1% plain lidocaine  Procedure Details  The procedure, risks and complications have been discussed in detail (including, but not limited to airway compromise, infection, bleeding) with the patient, and the patient has signed consent to the procedure.  The skin was sterilely prepped and draped over the affected area in the usual fashion. After adequate local anesthesia, I&D with a #11 blade was performed on the left great toe. Purulent drainage: present The patient was observed until stable.  Findings: Abscess with purulent drainage  EBL: Minimal cc's  Drains: None  Condition: Tolerated procedure well and Stable   Complications: none.

## 2013-06-26 NOTE — Progress Notes (Signed)
ANTIBIOTIC CONSULT NOTE   Pharmacy Consult for Vancomycin and Zosyn Indication: Cellulitis  No Known Allergies  Patient Measurements: Height: 5\' 5"  (165.1 cm) Weight: 160 lb (72.576 kg) IBW/kg (Calculated) : 57  Vital Signs: Temp: 98 F (36.7 C) (09/26 0456) Temp src: Oral (09/26 0456) BP: 104/70 mmHg (09/26 0456) Pulse Rate: 88 (09/26 0456) Intake/Output from previous day: 09/25 0701 - 09/26 0700 In: 960 [P.O.:960] Out: -  Intake/Output from this shift:    Labs:  Recent Labs  06/23/13 1750 06/24/13 0457 06/26/13 0452  WBC 21.2* 14.1* 10.0  HGB 13.6 11.7* 12.2  PLT 337 304 344  CREATININE 0.79 0.78 1.02   Estimated Creatinine Clearance: 64.4 ml/min (by C-G formula based on Cr of 1.02).  Recent Labs  06/26/13 0453  VANCOTROUGH 16.6     Microbiology: No results found for this or any previous visit (from the past 720 hour(s)).  Medical History: Past Medical History  Diagnosis Date  . Diabetes mellitus without complication   . Hypertension   . Hypercholesterolemia     Medications:  Scheduled:  . aspirin  325 mg Oral Daily  . enoxaparin (LOVENOX) injection  40 mg Subcutaneous Q24H  . glipiZIDE  5 mg Oral BID AC   And  . metFORMIN  500 mg Oral BID AC  . lisinopril  20 mg Oral Daily   And  . hydrochlorothiazide  12.5 mg Oral Daily  . insulin aspart  0-15 Units Subcutaneous TID WC  . insulin aspart  0-5 Units Subcutaneous QHS  . insulin glargine  15 Units Subcutaneous QHS  . nicotine  21 mg Transdermal Daily  . pantoprazole  40 mg Oral Daily  . piperacillin-tazobactam (ZOSYN)  IV  3.375 g Intravenous Q8H  . simvastatin  20 mg Oral q1800  . vancomycin  1,000 mg Intravenous Q12H   Assessment: Okay for Protocol Patient Received initial doses in the ED on admission. Renal fxn is stable and trough level is acceptable on high end of range.  Pt is afebrile.  Some improvement noted per MD.  Laqueta Jean 9/23 >> Vancomycin 9/23 >>  Goal of Therapy:   Vancomycin trough level 10-15 mcg/ml  Plan:  Zosyn 3.375 GM IV every 8 hours. Vancomycin 1000mg  IV every 12 hours. Check trough level weekly while on Vancomycin Duration of therapy per MD Monitor SCr at least twice weekly while on Vancomycin   Margo Aye, Baltasar Twilley A 06/26/2013,8:23 AM

## 2013-06-27 LAB — GLUCOSE, CAPILLARY
Glucose-Capillary: 133 mg/dL — ABNORMAL HIGH (ref 70–99)
Glucose-Capillary: 117 mg/dL — ABNORMAL HIGH (ref 70–99)

## 2013-06-27 LAB — HEMOGLOBIN A1C
Hgb A1c MFr Bld: 8.5 % — ABNORMAL HIGH (ref <5.7)
Mean Plasma Glucose: 197 mg/dL — ABNORMAL HIGH (ref <117)

## 2013-06-27 NOTE — Progress Notes (Signed)
[facility] PROGRESS NOTE  FUSAYE WACHTEL ZOX:[id number] DOB: 1961/04/17 DOA: 06/23/2013 PCP: No primary provider on file.  Assessment/Plan  Left foot cellulitis, toe considerably less swollen today but erythema still pronounced on foot and toe and TTP -  Continue vancomycin and Zosyn -  Appreciate general surgery assistance -  F/u wound culture  Nausea and vomiting, resolved.  May be related to antibiotics or infection. -  Zofran prn  Diabetes mellitus type 2, CBG well controlled. -  Continue Lantus 15 units -  Continue aspart sliding scale moderate dose with each bedtime insulin -  A1c 8.5  Hypertension/HLD, blood pressures controlled -  Continue lisinopril and hydrochlorothiazide -  Continue simvastatin  Cigarette abuse and dependence with nicotine withdrawal -  Nicotine patch 21 -  Counseled smoking cessation  Leukocytosis, resolved.  Normocytic anemia, likely marrow suppression from acute illness and some hemodilution from IV fluids, resolved.  Hyponatremia, resolved with IV fluids.  Constipation resolved with colace, senna, and MiraLax   Diet:  Diabetic Access:  PIV IVF:  Yes Proph:  Lovenox  Code Status: Full Family Communication: Patient alone Disposition Plan: Pending improvement in cellulitis, likely home tomorrow   Consultants:  None  Procedures:  X-ray left foot 2 view  Antibiotics:  Vancomycin from 9/23  Zosyn from 9/23   HPI/Subjective:  Patient states that the pain of her left big toe and dorsum of her left foot is improved.  Had BM.  Denies n/v.    Objective: Filed Vitals:   06/26/13 1119 06/26/13 1454 06/26/13 2100 06/27/13 0458  BP: 199/79 136/70 115/62 113/62  Pulse:  96 86 85  Temp:  98.3 F (36.8 C) 98.9 F (37.2 C) 98 F (36.7 C)  TempSrc:  Oral Oral Oral  Resp:   20 20  Height:      Weight:      SpO2:  95% 98% 98%    Intake/Output Summary (Last 24 hours) at 06/27/13 1048 Last data filed at 06/26/13 1809  Gross per 24 hour  Intake   2455 ml  Output      0 ml  Net   2455 ml   Filed Weights   06/23/13 1708  Weight: 72.576 kg (160 lb)    Exam:   General:  Caucasian female, No acute distress  HEENT:  NCAT, MMM  Cardiovascular:  RRR, nl S1, S2 no mrg, 2+ pulses, warm extremities  Respiratory:  Diminished bilateral breath sounds with no focal wheezes, rhonchi or rales, no increased WOB  Abdomen:   NABS, soft, NT/ND  MSK:   Normal tone and bulk, 1+ left lower extremity edema of the top of the foot  Skin: Erythema resolved on shin.  Erythema now prominent over entire dorsum of foot, although 1st toe is less swollen.  Warm to touch and TTP but less so than yesterday    Neuro:  Grossly intact  Data Reviewed: Basic Metabolic Panel:  Recent Labs Lab 06/23/13 1750 06/24/13 0457 06/26/13 0452  NA 126* 134* 137  K 3.8 3.8 4.1  CL 86* 98 97  CO2 28 27 30   GLUCOSE 242* 115* 132*  BUN 8 7 7   CREATININE 0.79 0.78 1.02  CALCIUM 10.0 9.4 9.9   Liver Function Tests:  Recent Labs Lab 06/24/13 0457  AST 10  ALT 10  ALKPHOS 145*  BILITOT 0.3  PROT 6.6  ALBUMIN 3.0*   No results found for this basename: LIPASE, AMYLASE,  in the last 168 hours No results found for  this basename: AMMONIA,  in the last 168 hours CBC:  Recent Labs Lab 06/23/13 1750 06/24/13 0457 06/26/13 0452  WBC 21.2* 14.1* 10.0  NEUTROABS 17.4*  --   --   HGB 13.6 11.7* 12.2  HCT 38.0 33.4* 36.2  MCV 88.0 88.8 90.3  PLT 337 304 344   Cardiac Enzymes: No results found for this basename: CKTOTAL, CKMB, CKMBINDEX, TROPONINI,  in the last 168 hours BNP (last 3 results) No results found for this basename: PROBNP,  in the last 8760 hours CBG:  Recent Labs Lab 06/26/13 0725 06/26/13 1221 06/26/13 1643 06/26/13 2038 06/27/13 0743  GLUCAP 151* 98 156* 163* 157*    Recent Results (from the past 240 hour(s))  CULTURE, ROUTINE-ABSCESS     Status: None   Collection Time    06/26/13 12:00 PM       Result Value Range Status   Specimen Description FOOT   Final   Special Requests Normal   Final   Gram Stain     Final   Value: NO WBC SEEN     NO SQUAMOUS EPITHELIAL CELLS SEEN     NO ORGANISMS SEEN     Performed at [facility]   Culture PENDING   Incomplete   Report Status PENDING   Incomplete     Studies: No results found.  Scheduled Meds: . aspirin  325 mg Oral Daily  . docusate sodium  100 mg Oral BID  . enoxaparin (LOVENOX) injection  40 mg Subcutaneous Q24H  . glipiZIDE  5 mg Oral BID AC   And  . metFORMIN  500 mg Oral BID AC  . lisinopril  20 mg Oral Daily   And  . hydrochlorothiazide  12.5 mg Oral Daily  . insulin aspart  0-15 Units Subcutaneous TID WC  . insulin aspart  0-5 Units Subcutaneous QHS  . insulin glargine  15 Units Subcutaneous QHS  . nicotine  21 mg Transdermal Daily  . pantoprazole  40 mg Oral Daily  . piperacillin-tazobactam (ZOSYN)  IV  3.375 g Intravenous Q8H  . senna  2 tablet Oral QHS  . simvastatin  20 mg Oral q1800  . vancomycin  1,000 mg Intravenous Q12H   Continuous Infusions:    Active Problems:   Cellulitis and abscess of foot   DM (diabetes mellitus)   HTN (hypertension)   Tobacco abuse   Hyperlipidemia   Nausea and vomiting   Unspecified constipation    Time spent: 30 min    Malvika Tung, [facility]  [facility] Pager [phone]. If 7PM-7AM, please contact night-coverage at [email or web address], password [email or web address] 06/27/2013, 10:48 AM  LOS: 4 days

## 2013-06-28 LAB — GLUCOSE, CAPILLARY: Glucose-Capillary: 171 mg/dL — ABNORMAL HIGH (ref 70–99)

## 2013-06-28 MED ORDER — CIPROFLOXACIN HCL 250 MG PO TABS
750.0000 mg | ORAL_TABLET | Freq: Two times a day (BID) | ORAL | Status: DC
  Administered 2013-06-28: 750 mg via ORAL
  Filled 2013-06-28: qty 3

## 2013-06-28 MED ORDER — PROMETHAZINE HCL 12.5 MG PO TABS: 12.5000 mg | ORAL_TABLET | Freq: Four times a day (QID) | ORAL | Status: DC | PRN

## 2013-06-28 MED ORDER — CIPROFLOXACIN HCL 750 MG PO TABS: 750.0000 mg | ORAL_TABLET | Freq: Two times a day (BID) | ORAL | Status: DC

## 2013-06-28 MED ORDER — NICOTINE 21 MG/24HR TD PT24: 1.0000 | MEDICATED_PATCH | Freq: Every day | TRANSDERMAL | Status: DC

## 2013-06-28 MED ORDER — TRAMADOL HCL 50 MG PO TABS: 50.0000 mg | ORAL_TABLET | Freq: Four times a day (QID) | ORAL | Status: DC | PRN

## 2013-06-28 MED ORDER — DOXYCYCLINE HYCLATE 100 MG PO TABS: 100.0000 mg | ORAL_TABLET | Freq: Two times a day (BID) | ORAL | Status: DC

## 2013-06-28 MED ORDER — DOXYCYCLINE HYCLATE 100 MG PO TABS
100.0000 mg | ORAL_TABLET | Freq: Two times a day (BID) | ORAL | Status: DC
  Administered 2013-06-28: 100 mg via ORAL
  Filled 2013-06-28: qty 1

## 2013-06-28 NOTE — Progress Notes (Signed)
NURSING PROGRESS NOTE  [name] [id number] Discharge Data: 06/28/2013 11:03 AM Attending Provider: Renae Fickle, MD PCP:No primary provider on file.     [name] to be D/C'd Home per MD order.  Discussed with the patient the After Visit Summary and all questions fully answered. All IV's discontinued with no bleeding noted. Dressing changed prior to discharge. All belongings returned to patient for patient to take home.   Last Vital Signs:  Blood pressure 110/65, pulse 80, temperature 98 F (36.7 C), temperature source Oral, resp. rate 20, height 5\' 5"  (1.651 m), weight 72.576 kg (160 lb), SpO2 99.00%.  Discharge Medication List   Medication List         acetaminophen 500 MG tablet  Commonly known as:  TYLENOL  Take 500 mg by mouth every 6 (six) hours as needed for pain.     aspirin 325 MG tablet  Take 325 mg by mouth daily.     ciprofloxacin 750 MG tablet  Commonly known as:  CIPRO  Take 1 tablet (750 mg total) by mouth 2 (two) times daily.     doxycycline 100 MG tablet  Commonly known as:  VIBRA-TABS  Take 1 tablet (100 mg total) by mouth every 12 (twelve) hours.     glipiZIDE-metformin 5-500 MG per tablet  Commonly known as:  METAGLIP  Take 1 tablet by mouth 2 (two) times daily.     insulin glargine 100 UNIT/ML injection  Commonly known as:  LANTUS  Inject 15 Units into the skin at bedtime.     lisinopril-hydrochlorothiazide 20-12.5 MG per tablet  Commonly known as:  PRINZIDE,ZESTORETIC  Take 1 tablet by mouth daily.     nicotine 21 mg/24hr patch  Commonly known as:  NICODERM CQ - dosed in mg/24 hours  Place 1 patch onto the skin daily.     omeprazole 20 MG capsule  Commonly known as:  PRILOSEC  Take 20 mg by mouth daily.     pravastatin 40 MG tablet  Commonly known as:  PRAVACHOL  Take 40 mg by mouth every evening.     promethazine 12.5 MG tablet  Commonly known as:  PHENERGAN  Take 1 tablet (12.5 mg total) by mouth every 6 (six) hours  as needed for nausea.     traMADol 50 MG tablet  Commonly known as:  ULTRAM  Take 1-2 tablets (50-100 mg total) by mouth every 6 (six) hours as needed for pain.

## 2013-06-30 LAB — CULTURE, ROUTINE-ABSCESS: Gram Stain: NONE SEEN

## 2013-09-14 ENCOUNTER — Ambulatory Visit (INDEPENDENT_AMBULATORY_CARE_PROVIDER_SITE_OTHER): Payer: Medicaid Other | Admitting: Obstetrics & Gynecology

## 2013-09-14 ENCOUNTER — Other Ambulatory Visit (HOSPITAL_COMMUNITY)
Admission: RE | Admit: 2013-09-14 | Discharge: 2013-09-14 | Disposition: A | Discharge status: Home / Self Care | Payer: Medicaid Other | Source: Ambulatory Visit | Attending: Obstetrics & Gynecology | Admitting: Obstetrics & Gynecology

## 2013-09-14 ENCOUNTER — Encounter: Payer: Self-pay | Admitting: Obstetrics & Gynecology

## 2013-09-14 VITALS — BP 128/70 | Ht 63.0 in | Wt 167.0 lb

## 2013-09-14 DIAGNOSIS — Z01419 Encounter for gynecological examination (general) (routine) without abnormal findings: Secondary | ICD-10-CM

## 2013-09-14 DIAGNOSIS — Z1212 Encounter for screening for malignant neoplasm of rectum: Secondary | ICD-10-CM

## 2013-09-14 DIAGNOSIS — Z0000 Encounter for general adult medical examination without abnormal findings: Secondary | ICD-10-CM

## 2013-09-14 DIAGNOSIS — Z1151 Encounter for screening for human papillomavirus (HPV): Secondary | ICD-10-CM | POA: Insufficient documentation

## 2013-09-14 NOTE — Addendum Note (Signed)
Addended by: Colen Darling on: 09/14/2013 03:28 PM   Modules accepted: Orders

## 2013-09-14 NOTE — Progress Notes (Signed)
Patient ID: [name], female   DOB: [birth year], 52 y.o.   MRN: [id number] Subjective:     [name] is a 52 y.o. female here for a routine exam.  No LMP recorded. Patient is postmenopausal. No obstetric history on file. Birth Control Method:  na Menstrual Calendar(currently): na  Current complaints: none.   Current acute medical issues:  diabetes   Recent Gynecologic History No LMP recorded. Patient is postmenopausal. Last Pap: 2013,  normal Last mammogram: 2014,  normal  Past Medical History  Diagnosis Date  . Diabetes mellitus without complication   . Hypertension   . Hypercholesterolemia     History reviewed. No pertinent past surgical history.  OB History   Grav Para Term Preterm Abortions TAB SAB Ect Mult Living                  History   Social History  . Marital Status: Legally Separated    Spouse Name: N/A    Number of Children: N/A  . Years of Education: N/A   Social History Main Topics  . Smoking status: Current Every Day Smoker  . Smokeless tobacco: None  . Alcohol Use: No  . Drug Use: No  . Sexual Activity: None   Other Topics Concern  . None   Social History Narrative  . None    History reviewed. No pertinent family history.   Review of Systems  Review of Systems  Constitutional: Negative for fever, chills, weight loss, malaise/fatigue and diaphoresis.  HENT: Negative for hearing loss, ear pain, nosebleeds, congestion, sore throat, neck pain, tinnitus and ear discharge.   Eyes: Negative for blurred vision, double vision, photophobia, pain, discharge and redness.  Respiratory: Negative for cough, hemoptysis, sputum production, shortness of breath, wheezing and stridor.   Cardiovascular: Negative for chest pain, palpitations, orthopnea, claudication, leg swelling and PND.  Gastrointestinal: negative for abdominal pain. Negative for heartburn, nausea, vomiting, diarrhea, constipation, blood in stool and melena.  Genitourinary:  Negative for dysuria, urgency, frequency, hematuria and flank pain.  Musculoskeletal: Negative for myalgias, back pain, joint pain and falls.  Skin: Negative for itching and rash.  Neurological: Negative for dizziness, tingling, tremors, sensory change, speech change, focal weakness, seizures, loss of consciousness, weakness and headaches.  Endo/Heme/Allergies: Negative for environmental allergies and polydipsia. Does not bruise/bleed easily.  Psychiatric/Behavioral: Negative for depression, suicidal ideas, hallucinations, memory loss and substance abuse. The patient is not nervous/anxious and does not have insomnia.        Objective:    Physical Exam  Vitals reviewed. Constitutional: She is oriented to person, place, and time. She appears well-developed and well-nourished.  HENT:  Head: Normocephalic and atraumatic.        Right Ear: External ear normal.  Left Ear: External ear normal.  Nose: Nose normal.  Mouth/Throat: Oropharynx is clear and moist.  Eyes: Conjunctivae and EOM are normal. Pupils are equal, round, and reactive to light. Right eye exhibits no discharge. Left eye exhibits no discharge. No scleral icterus.  Neck: Normal range of motion. Neck supple. No tracheal deviation present. No thyromegaly present.  Cardiovascular: Normal rate, regular rhythm, normal heart sounds and intact distal pulses.  Exam reveals no gallop and no friction rub.   No murmur heard. Respiratory: Effort normal and breath sounds normal. No respiratory distress. She has no wheezes. She has no rales. She exhibits no tenderness.  GI: Soft. Bowel sounds are normal. She exhibits no distension and no mass. There is no tenderness. There  is no rebound and no guarding.  Genitourinary:  Breasts no masses skin changes or nipple changes bilaterally      Vulva is normal without lesions Vagina is pink moist without discharge Cervix normal in appearance and pap is done Uterus is normal size shape and  contour Adnexa is negative with normal sized ovaries  Rectal    hemoccult negative, normal tone, no masses  Musculoskeletal: Normal range of motion. She exhibits no edema and no tenderness.  Neurological: She is alert and oriented to person, place, and time. She has normal reflexes. She displays normal reflexes. No cranial nerve deficit. She exhibits normal muscle tone. Coordination normal.  Skin: Skin is warm and dry. No rash noted. No erythema. No pallor.  Psychiatric: She has a normal mood and affect. Her behavior is normal. Judgment and thought content normal.       Assessment:    Healthy female exam.    Plan:    Mammogram ordered. Follow up in: 1 year.

## 2014-05-24 ENCOUNTER — Emergency Department (HOSPITAL_COMMUNITY)
Admission: EM | Admit: 2014-05-24 | Discharge: 2014-05-24 | Disposition: A | Discharge status: Home / Self Care | Payer: Medicaid Other | Attending: Emergency Medicine | Admitting: Emergency Medicine

## 2014-05-24 ENCOUNTER — Encounter (HOSPITAL_COMMUNITY): Payer: Self-pay | Admitting: Emergency Medicine

## 2014-05-24 DIAGNOSIS — Z7982 Long term (current) use of aspirin: Secondary | ICD-10-CM | POA: Insufficient documentation

## 2014-05-24 DIAGNOSIS — L03032 Cellulitis of left toe: Secondary | ICD-10-CM

## 2014-05-24 DIAGNOSIS — Z792 Long term (current) use of antibiotics: Secondary | ICD-10-CM | POA: Insufficient documentation

## 2014-05-24 DIAGNOSIS — E7800 Pure hypercholesterolemia, unspecified: Secondary | ICD-10-CM | POA: Diagnosis not present

## 2014-05-24 DIAGNOSIS — F17200 Nicotine dependence, unspecified, uncomplicated: Secondary | ICD-10-CM | POA: Insufficient documentation

## 2014-05-24 DIAGNOSIS — L03039 Cellulitis of unspecified toe: Secondary | ICD-10-CM | POA: Diagnosis not present

## 2014-05-24 DIAGNOSIS — Z794 Long term (current) use of insulin: Secondary | ICD-10-CM | POA: Insufficient documentation

## 2014-05-24 DIAGNOSIS — E119 Type 2 diabetes mellitus without complications: Secondary | ICD-10-CM | POA: Insufficient documentation

## 2014-05-24 DIAGNOSIS — I10 Essential (primary) hypertension: Secondary | ICD-10-CM | POA: Insufficient documentation

## 2014-05-24 DIAGNOSIS — Z79899 Other long term (current) drug therapy: Secondary | ICD-10-CM | POA: Diagnosis not present

## 2014-05-24 DIAGNOSIS — M79609 Pain in unspecified limb: Secondary | ICD-10-CM | POA: Insufficient documentation

## 2014-05-24 MED ORDER — DOXYCYCLINE HYCLATE 100 MG PO CAPS: 100.0000 mg | ORAL_CAPSULE | Freq: Two times a day (BID) | ORAL | Status: DC

## 2014-05-24 NOTE — ED Notes (Signed)
Pt putting on her shoes, states< MD just left saying she could go home. Will check on discharge paper, Pt states toe is sore and red,but does not hurt that bad.

## 2014-05-24 NOTE — Discharge Instructions (Signed)
Paronychia  Paronychia is an infection of the skin caused by germs. It happens by the fingernail or toenail. You can avoid it by not:  Pulling on hangnails.  Nail biting.  Thumb sucking.  Cutting fingernails and toenails too short.  Cutting the skin at the base and sides of the fingernail or toenail (cuticle). HOME CARE  Keep the fingers or toes very dry. Put rubber gloves over cotton gloves when putting hands in water.  Keep the wound clean and bandaged (dressed) as told by your doctor.  Soak the fingers or toes in warm water for 15 to 20 minutes. Soak them 3 to 4 times per day for germ infections. Fungal infections are difficult to treat. Fungal infections often require treatment for a long time.  Only take medicine as told by your doctor. GET HELP RIGHT AWAY IF:   You have redness, puffiness (swelling), or pain that gets worse.  You see yellowish-white fluid (pus) coming from the wound.  You have a fever.  You have a bad smell coming from the wound or bandage. MAKE SURE YOU:  Understand these instructions.  Will watch your condition.  Will get help if you are not doing well or get worse. Document Released: 09/05/2009 Document Revised: 12/10/2011 Document Reviewed: 09/05/2009 [facility] Patient Information 2015 [place], [place]. This information is not intended to replace advice given to you by your health care provider. Make sure you discuss any questions you have with your health care provider.  Paronychia  Paronychia is an infection of the skin caused by germs. It happens by the fingernail or toenail. You can avoid it by not:  Pulling on hangnails.  Nail biting.  Thumb sucking.  Cutting fingernails and toenails too short.  Cutting the skin at the base and sides of the fingernail or toenail (cuticle). HOME CARE  Keep the fingers or toes very dry. Put rubber gloves over cotton gloves when putting hands in water.  Keep the wound clean and bandaged (dressed) as  told by your doctor.  Soak the fingers or toes in warm water for 15 to 20 minutes. Soak them 3 to 4 times per day for germ infections. Fungal infections are difficult to treat. Fungal infections often require treatment for a long time.  Only take medicine as told by your doctor. GET HELP RIGHT AWAY IF:   You have redness, puffiness (swelling), or pain that gets worse.  You see yellowish-white fluid (pus) coming from the wound.  You have a fever.  You have a bad smell coming from the wound or bandage. MAKE SURE YOU:  Understand these instructions.  Will watch your condition.  Will get help if you are not doing well or get worse. Document Released: 09/05/2009 Document Revised: 12/10/2011 Document Reviewed: 09/05/2009 [facility] Patient Information 2015 [place], [place]. This information is not intended to replace advice given to you by your health care provider. Make sure you discuss any questions you have with your health care provider.  Soak the foot in warm water each day for 20 minutes. Take antibiotic as directed. Follow up with health department in next few days. Return for worse symptoms.

## 2014-05-24 NOTE — ED Notes (Signed)
Pt clipped toe nails recently and now has an infection to left middle toe for several days, pt is diabetic as well

## 2014-05-24 NOTE — ED Provider Notes (Signed)
CSN: [id number]     Arrival date & time 05/24/14  1635 History  This chart was scribed for Alyssa Mulders, MD, by [name], ED Scribe. This patient was seen in room APA09/APA09 and the patient's care was started at 6:59 PM.  First MD Initiated Contact with Patient 05/24/14 1829     Chief Complaint  Patient presents with  . Toe Pain   Patient is a 53 y.o. female presenting with lower extremity pain. The history is provided by the patient. No language interpreter was used.  Foot Pain This is a new problem. The current episode started more than 2 days ago. The problem occurs constantly. The problem has been gradually worsening. Pertinent negatives include no chest pain, no abdominal pain, no headaches and no shortness of breath. Nothing aggravates the symptoms. Nothing relieves the symptoms. She has tried nothing for the symptoms.   HPI Comments: [name] is a 53 y.o. female, with a h/o DM and HTN, who presents to the Emergency Department complaining of pain to her left third toe which has persisted for several days and has been associated with swelling. She reports onset of the pain after clipping her toe-nails and she rates the pain as 1/10. She denies any drainage from the site. She denies a fever, chills, or abdominal pain. She also denies a h/o similar symptoms to the toes.  The pt's DM is controlled with insulin and Metformin. She reports she checks her blood sugar levels at home, and she reports they are typically in the 120's. She denies current antibiotics. [name] is a current smoker.  The pt uses the health department for her PCP.    Past Medical History  Diagnosis Date  . Diabetes mellitus without complication   . Hypertension   . Hypercholesterolemia    History reviewed. No pertinent past surgical history. History reviewed. No pertinent family history. History  Substance Use Topics  . Smoking status: Current Every Day Smoker    Types: Cigarettes  . Smokeless  tobacco: Not on file  . Alcohol Use: No   No OB history provided.  Review of Systems  Constitutional: Negative for fever and chills.  HENT: Negative for congestion and sore throat.   Eyes: Negative for visual disturbance.  Respiratory: Negative for cough and shortness of breath.   Cardiovascular: Negative for chest pain and leg swelling.  Gastrointestinal: Negative for nausea, vomiting, abdominal pain and diarrhea.  Genitourinary: Negative for dysuria and difficulty urinating.  Musculoskeletal: Negative for back pain.  Skin: Negative for rash.  Neurological: Negative for headaches.  Hematological: Does not bruise/bleed easily.  Psychiatric/Behavioral: Negative for confusion.    Allergies  Review of patient's allergies indicates no known allergies.  Home Medications   Prior to Admission medications   Medication Sig Start Date End Date Taking? Authorizing Provider  acetaminophen (TYLENOL) 500 MG tablet Take 500 mg by mouth every 6 (six) hours as needed for pain.    Historical Provider, MD  aspirin 325 MG tablet Take 325 mg by mouth daily.    Historical Provider, MD  ciprofloxacin (CIPRO) 750 MG tablet Take 1 tablet (750 mg total) by mouth 2 (two) times daily. 06/28/13   Alyssa Fickle, MD  doxycycline (VIBRA-TABS) 100 MG tablet Take 1 tablet (100 mg total) by mouth every 12 (twelve) hours. 06/28/13   Alyssa Fickle, MD  doxycycline (VIBRAMYCIN) 100 MG capsule Take 1 capsule (100 mg total) by mouth 2 (two) times daily. 05/24/14   Alyssa Mulders, MD  glipiZIDE-metformin (  METAGLIP) 5-500 MG per tablet Take 1 tablet by mouth 2 (two) times daily.    Historical Provider, MD  insulin glargine (LANTUS) 100 UNIT/ML injection Inject 15 Units into the skin at bedtime.    Historical Provider, MD  lisinopril-hydrochlorothiazide (PRINZIDE,ZESTORETIC) 20-12.5 MG per tablet Take 1 tablet by mouth daily.    Historical Provider, MD  nicotine (NICODERM CQ - DOSED IN MG/24 HOURS) 21 mg/24hr patch Place  1 patch onto the skin daily. 06/28/13   Alyssa Fickle, MD  omeprazole (PRILOSEC) 20 MG capsule Take 20 mg by mouth daily.    Historical Provider, MD  pravastatin (PRAVACHOL) 40 MG tablet Take 40 mg by mouth every evening.    Historical Provider, MD  promethazine (PHENERGAN) 12.5 MG tablet Take 1 tablet (12.5 mg total) by mouth every 6 (six) hours as needed for nausea. 06/28/13   Alyssa Fickle, MD  traMADol (ULTRAM) 50 MG tablet Take 1-2 tablets (50-100 mg total) by mouth every 6 (six) hours as needed for pain. 06/28/13   Alyssa Fickle, MD   Triage Vitals: BP 143/80  Pulse 93  Temp(Src) 98.4 F (36.9 C) (Oral)  Resp 18  Wt 152 lb (68.947 kg)  SpO2 95%  Physical Exam  Nursing note and vitals reviewed. Constitutional: She is oriented to person, place, and time. She appears well-developed and well-nourished. No distress.  HENT:  Head: Normocephalic and atraumatic.  Eyes: Conjunctivae and EOM are normal.  Neck: Neck supple. No tracheal deviation present.  Cardiovascular: Normal rate, regular rhythm and normal heart sounds.   No murmur heard. DP on right foot is 1+ . Cap refill on big toe is 1 second.  DP on left is 1+. Cap refill on big toe is 1 second.   Pulmonary/Chest: Effort normal and breath sounds normal. No respiratory distress. She has no wheezes. She has no rales.  Abdominal: Bowel sounds are normal. There is no tenderness.  Musculoskeletal: Normal range of motion.  Neurological: She is alert and oriented to person, place, and time.  Skin: Skin is warm and dry.  Swelling in the perionychial area located to the posterior aspect of the nail. There is no swelling or redness to the proximal part of the toe. No swelling on the plantar surface of the toe.   Psychiatric: She has a normal mood and affect. Her behavior is normal.    ED Course  Procedures (including critical care time)  DIAGNOSTIC STUDIES: Oxygen Saturation is 95% on room air, normal by my interpretation.     COORDINATION OF CARE:  7:08 PM- Discussed treatment plan with patient, and the patient agreed to the plan. Advised pt to soak her toe 20 minutes a day in warm water. Will also provide pt an antibiotic. Informed pt to return to ED if the redness increases in size.   No results found.    MDM   Final diagnoses:  Paronychia of third toe, left   Symptoms consistent with a perinatal infection no purulent discharge. Redness localized just to around the back of the nail no proximal toe redness or red streaking. Symptoms only been present for 2 days which is 2 shorter period of time for osteomyelitis to be present. Patient had no previous infections in this area. Patient retrieve the doxycycline warm soaks. Since the toe was not acutely infected we'll not open up the paronychia.  I personally performed the services described in this documentation, which was scribed in my presence. The recorded information has been reviewed and is accurate.  Alyssa Mulders, MD 05/24/14 [phone]

## 2014-05-24 NOTE — ED Notes (Signed)
Patient given discharge instruction, verbalized understand. Patient ambulatory out of the department.  

## 2014-12-08 ENCOUNTER — Other Ambulatory Visit: Payer: Medicaid Other | Admitting: Adult Health

## 2014-12-09 ENCOUNTER — Encounter: Payer: Self-pay | Admitting: Adult Health

## 2014-12-09 ENCOUNTER — Ambulatory Visit (INDEPENDENT_AMBULATORY_CARE_PROVIDER_SITE_OTHER): Payer: Medicaid Other | Admitting: Adult Health

## 2014-12-09 VITALS — BP 96/54 | HR 74 | Ht 65.5 in | Wt 156.0 lb

## 2014-12-09 DIAGNOSIS — Z1212 Encounter for screening for malignant neoplasm of rectum: Secondary | ICD-10-CM | POA: Diagnosis not present

## 2014-12-09 DIAGNOSIS — Z01419 Encounter for gynecological examination (general) (routine) without abnormal findings: Secondary | ICD-10-CM | POA: Diagnosis not present

## 2014-12-09 DIAGNOSIS — Z139 Encounter for screening, unspecified: Secondary | ICD-10-CM

## 2014-12-09 DIAGNOSIS — Z0000 Encounter for general adult medical examination without abnormal findings: Secondary | ICD-10-CM

## 2014-12-09 LAB — HEMOCCULT GUIAC POC 1CARD (OFFICE): Fecal Occult Blood, POC: NEGATIVE

## 2014-12-09 NOTE — Progress Notes (Signed)
Patient ID: [name], female   DOB: [birth year], 54 y.o.   MRN: [id number] History of Present Illness: [name] is a 54 year old white female in for well woman gyn exam.She had a normal pap 09/14/13.   Current Medications, Allergies, Past Medical History, Past Surgical History, Family History and Social History were reviewed in [facility] electronic medical record.     Review of Systems: Patient denies any headaches, hearing loss, fatigue, blurred vision, shortness of breath, chest pain, abdominal pain, problems with bowel movements, or intercourse(not having sex). No joint pain or mood swings.No bleeding, has some SUI, she is trying to quit smoking got patch at hospital.    Physical Exam:BP 96/54 mmHg  Pulse 74  Ht 5' 5.5" (1.664 m)  Wt 156 lb (70.761 kg)  BMI 25.56 kg/m2 General:  Well developed, well nourished, no acute distress Skin:  Warm and dry, has increased chin hair and is missing front teeth, has lots of black heads over body Neck:  Midline trachea, normal thyroid, good ROM, no lymphadenopathy Lungs; Clear to auscultation bilaterally Breast:  No dominant palpable mass, retraction, or nipple discharge Cardiovascular: Regular rate and rhythm Abdomen:  Soft, non tender, no hepatosplenomegaly Pelvic:  External genitalia is normal in appearance, no lesions.  The vagina has decreased color, moisture and rugae.Marland Kitchen. Urethra has no lesions or masses. The cervix is bulbous and smooth.  Uterus is felt to be normal size, shape, and contour.  No adnexal masses or tenderness noted.Bladder is non tender, no masses felt. Rectal: Good sphincter tone, no polyps, or hemorrhoids felt.  Hemoccult negative. Extremities/musculoskeletal:  No swelling or varicosities noted, no clubbing or cyanosis Psych:  No mood changes, alert and cooperative,seems happy [facility] and they manage her diabetes.But she needs mammogram last one 2012 and has never had  colonoscopy  Impression: Well woman gyn exam no pap   Plan: Pap and physical in 1 year Mammogram now number given for [facility]  Referred to Dr Darrick PennaFields for colonoscopy

## 2014-12-09 NOTE — Patient Instructions (Addendum)
Get mammogram [phone] Pap and physical in 1 year Colonoscopy advised refer to Dr Darrick PennaFields

## 2015-01-11 ENCOUNTER — Telehealth: Payer: Self-pay

## 2015-01-11 NOTE — Telephone Encounter (Signed)
Called pt. Triaged, but he does not have anyone who can go with him to the hospital.

## 2015-01-11 NOTE — Telephone Encounter (Signed)
[facility]. Patient can be reached at [phone]

## 2015-01-13 NOTE — Telephone Encounter (Signed)
PT is aware that he needs to find someone to ride with him even if he uses [facility].  He will let me know, said he has no friends and no family.

## 2015-01-13 NOTE — Telephone Encounter (Signed)
Forwarding the note to Cyril MourningJennifer Griffin, NP who referred the pt.

## 2015-03-04 NOTE — Telephone Encounter (Signed)
Pt is calling back to try and set up her TCS. I told her that she will still need a ride. Please try to call her and see if we can try to get this done.

## 2015-03-10 NOTE — Telephone Encounter (Signed)
PT will check and see if she can get someone to go with her on [facility]. She will call back when she gets it worked out.

## 2015-03-28 ENCOUNTER — Emergency Department (HOSPITAL_COMMUNITY)
Admission: EM | Admit: 2015-03-28 | Discharge: 2015-03-28 | Disposition: A | Discharge status: Home / Self Care | Payer: Medicaid Other | Attending: Emergency Medicine | Admitting: Emergency Medicine

## 2015-03-28 ENCOUNTER — Encounter (HOSPITAL_COMMUNITY): Payer: Self-pay | Admitting: *Deleted

## 2015-03-28 DIAGNOSIS — E780 Pure hypercholesterolemia: Secondary | ICD-10-CM | POA: Insufficient documentation

## 2015-03-28 DIAGNOSIS — M79662 Pain in left lower leg: Secondary | ICD-10-CM | POA: Diagnosis present

## 2015-03-28 DIAGNOSIS — Z720 Tobacco use: Secondary | ICD-10-CM | POA: Insufficient documentation

## 2015-03-28 DIAGNOSIS — Z794 Long term (current) use of insulin: Secondary | ICD-10-CM | POA: Diagnosis not present

## 2015-03-28 DIAGNOSIS — E119 Type 2 diabetes mellitus without complications: Secondary | ICD-10-CM | POA: Diagnosis not present

## 2015-03-28 DIAGNOSIS — Z7982 Long term (current) use of aspirin: Secondary | ICD-10-CM | POA: Diagnosis not present

## 2015-03-28 DIAGNOSIS — Z79899 Other long term (current) drug therapy: Secondary | ICD-10-CM | POA: Diagnosis not present

## 2015-03-28 DIAGNOSIS — I10 Essential (primary) hypertension: Secondary | ICD-10-CM | POA: Insufficient documentation

## 2015-03-28 DIAGNOSIS — L03116 Cellulitis of left lower limb: Secondary | ICD-10-CM | POA: Diagnosis not present

## 2015-03-28 MED ORDER — SULFAMETHOXAZOLE-TRIMETHOPRIM 800-160 MG PO TABS
1.0000 | ORAL_TABLET | Freq: Once | ORAL | Status: AC
  Administered 2015-03-28: 1 via ORAL
  Filled 2015-03-28: qty 1

## 2015-03-28 MED ORDER — SULFAMETHOXAZOLE-TRIMETHOPRIM 800-160 MG PO TABS: 1.0000 | ORAL_TABLET | Freq: Two times a day (BID) | ORAL | Status: DC

## 2015-03-28 NOTE — ED Notes (Signed)
Patient with red, swollen L lower leg. Patient unable to state when it started, just started "sometime this month." Patient poor historian.

## 2015-03-28 NOTE — Discharge Instructions (Signed)
You understand that I have recommended admission to the hospital. You can return anytime to be admitted for your infection. Prescription for antibiotic. Keep your leg clean.

## 2015-03-28 NOTE — ED Notes (Signed)
Patient given discharge instruction, verbalized understand. Patient ambulatory out of the department.  

## 2015-03-28 NOTE — ED Provider Notes (Signed)
CSN: [id number]     Arrival date & time 03/28/15  1139 History   First MD Initiated Contact with Patient 03/28/15 1439     Chief Complaint  Patient presents with  . Leg Pain     (Consider location/radiation/quality/duration/timing/severity/associated sxs/prior Treatment) HPI..... Left lower extremity erythema from knee to foot for unknown period of time with associated ulcer on the ball of patient's foot. No fever or chills. Patient does not want any testing done nor an admission to the hospital.  Her primary care relationship was at the health department  Past Medical History  Diagnosis Date  . Diabetes mellitus without complication   . Hypertension   . Hypercholesterolemia    History reviewed. No pertinent past surgical history. Family History  Problem Relation Age of Onset  . Asthma Mother   . Cancer Mother   . Hypertension Mother   . Hypertension Father   . Diabetes Father   . Stroke Brother    History  Substance Use Topics  . Smoking status: Current Every Day Smoker -- 1.00 packs/day for 34 years    Types: Cigarettes  . Smokeless tobacco: Not on file  . Alcohol Use: Yes     Comment: occasionally   OB History    Gravida Para Term Preterm AB TAB SAB Ectopic Multiple Living   1 1             Review of Systems  All other systems reviewed and are negative.     Allergies  Review of patient's allergies indicates no known allergies.  Home Medications   Prior to Admission medications   Medication Sig Start Date End Date Taking? Authorizing Provider  acetaminophen (TYLENOL) 500 MG tablet Take 500 mg by mouth every 6 (six) hours as needed for pain.   Yes Historical Provider, MD  aspirin 325 MG tablet Take 325 mg by mouth daily. PT SAID SHE TAKES 2 DAILY FOR COLD AND COLD LIKE SYMPTOMS   Yes Historical Provider, MD  glipiZIDE-metformin (METAGLIP) 5-500 MG per tablet Take 1 tablet by mouth 2 (two) times daily before a meal. TAKES ONE AT LUNCH AND ONE AT EVENING MEAL    Yes Historical Provider, MD  insulin glargine (LANTUS) 100 UNIT/ML injection Inject 15 Units into the skin at bedtime.   Yes Historical Provider, MD  lisinopril-hydrochlorothiazide (PRINZIDE,ZESTORETIC) 20-12.5 MG per tablet Take 1 tablet by mouth daily.   Yes Historical Provider, MD  pravastatin (PRAVACHOL) 40 MG tablet Take 40 mg by mouth every evening.   Yes Historical Provider, MD  nicotine (NICODERM CQ - DOSED IN MG/24 HOURS) 21 mg/24hr patch Place 1 patch onto the skin daily. Patient not taking: Reported on 12/09/2014 06/28/13   Renae FickleMackenzie Short, MD  sulfamethoxazole-trimethoprim (BACTRIM DS,SEPTRA DS) 800-160 MG per tablet Take 1 tablet by mouth 2 (two) times daily. 03/28/15 04/04/15  Donnetta HutchingBrian Elick Aguilera, MD   BP 121/73 mmHg  Pulse 87  Temp(Src) 97.8 F (36.6 C) (Oral)  Resp 20  Ht 5\' 1"  (1.549 m)  Wt 150 lb (68.04 kg)  BMI 28.36 kg/m2  SpO2 100% Physical Exam  Constitutional: She is oriented to person, place, and time. She appears well-developed and well-nourished.  HENT:  Head: Normocephalic and atraumatic.  Eyes: Conjunctivae and EOM are normal. Pupils are equal, round, and reactive to light.  Neck: Normal range of motion. Neck supple.  Cardiovascular: Normal rate and regular rhythm.   Pulmonary/Chest: Effort normal and breath sounds normal.  Abdominal: Soft. Bowel sounds are normal.  Musculoskeletal: Normal  range of motion.  Neurological: She is alert and oriented to person, place, and time.  Skin:  Left lower extremity: Erythema from knee to foot. 2.5 cm ulcer on the ball of her left foot  Psychiatric: She has a normal mood and affect. Her behavior is normal.  Nursing note and vitals reviewed.   ED Course  Procedures (including critical care time) Labs Review Labs Reviewed - No data to display  Imaging Review No results found.   EKG Interpretation None      MDM   Final diagnoses:  Cellulitis of left lower extremity    I strongly recommended testing and admission for  lower extremity cellulitis. Patient understands the severity of her condition including loss of limb or death. She wants no testing. Will start Septra DS for presumptive MRSA. Patient understands to return at any time for further evaluation and/or admission    Donnetta Hutching, MD 03/28/15 1601

## 2015-03-28 NOTE — ED Notes (Signed)
Fed pt and gave blue scrubs, pt is wet, pt not willing to stay, explain risk to pt

## 2015-03-28 NOTE — ED Notes (Signed)
Pt has ulcer on bottom of left foot, pt does not know how long it has been there, foot and leg swollen and red.

## 2015-03-30 ENCOUNTER — Emergency Department (HOSPITAL_COMMUNITY)
Admission: EM | Admit: 2015-03-30 | Discharge: 2015-03-31 | Disposition: A | Discharge status: Home / Self Care | Payer: MEDICAID | Attending: Emergency Medicine | Admitting: Emergency Medicine

## 2015-03-30 ENCOUNTER — Encounter (HOSPITAL_COMMUNITY): Payer: Self-pay

## 2015-03-30 DIAGNOSIS — L03116 Cellulitis of left lower limb: Secondary | ICD-10-CM | POA: Diagnosis not present

## 2015-03-30 DIAGNOSIS — E780 Pure hypercholesterolemia: Secondary | ICD-10-CM | POA: Insufficient documentation

## 2015-03-30 DIAGNOSIS — F319 Bipolar disorder, unspecified: Secondary | ICD-10-CM

## 2015-03-30 DIAGNOSIS — Z794 Long term (current) use of insulin: Secondary | ICD-10-CM | POA: Diagnosis not present

## 2015-03-30 DIAGNOSIS — Z79899 Other long term (current) drug therapy: Secondary | ICD-10-CM | POA: Insufficient documentation

## 2015-03-30 DIAGNOSIS — E119 Type 2 diabetes mellitus without complications: Secondary | ICD-10-CM | POA: Insufficient documentation

## 2015-03-30 DIAGNOSIS — I10 Essential (primary) hypertension: Secondary | ICD-10-CM | POA: Diagnosis not present

## 2015-03-30 DIAGNOSIS — Z7982 Long term (current) use of aspirin: Secondary | ICD-10-CM | POA: Diagnosis not present

## 2015-03-30 DIAGNOSIS — F419 Anxiety disorder, unspecified: Secondary | ICD-10-CM | POA: Insufficient documentation

## 2015-03-30 DIAGNOSIS — Z046 Encounter for general psychiatric examination, requested by authority: Secondary | ICD-10-CM | POA: Diagnosis present

## 2015-03-30 DIAGNOSIS — Z720 Tobacco use: Secondary | ICD-10-CM | POA: Insufficient documentation

## 2015-03-30 LAB — BASIC METABOLIC PANEL
Anion gap: 8 (ref 5–15)
BUN: 10 mg/dL (ref 6–20)
CHLORIDE: 91 mmol/L — AB (ref 101–111)
CO2: 28 mmol/L (ref 22–32)
Calcium: 8.8 mg/dL — ABNORMAL LOW (ref 8.9–10.3)
Creatinine, Ser: 0.91 mg/dL (ref 0.44–1.00)
GLUCOSE: 153 mg/dL — AB (ref 65–99)
POTASSIUM: 4.5 mmol/L (ref 3.5–5.1)
SODIUM: 127 mmol/L — AB (ref 135–145)

## 2015-03-30 LAB — CBC WITH DIFFERENTIAL/PLATELET
BASOS ABS: 0 10*3/uL (ref 0.0–0.1)
Basophils Relative: 0 % (ref 0–1)
EOS PCT: 1 % (ref 0–5)
Eosinophils Absolute: 0.1 10*3/uL (ref 0.0–0.7)
HCT: 31.3 % — ABNORMAL LOW (ref 36.0–46.0)
Hemoglobin: 10.8 g/dL — ABNORMAL LOW (ref 12.0–15.0)
LYMPHS ABS: 1.9 10*3/uL (ref 0.7–4.0)
LYMPHS PCT: 15 % (ref 12–46)
MCH: 30.8 pg (ref 26.0–34.0)
MCHC: 34.5 g/dL (ref 30.0–36.0)
MCV: 89.2 fL (ref 78.0–100.0)
Monocytes Absolute: 0.7 10*3/uL (ref 0.1–1.0)
Monocytes Relative: 6 % (ref 3–12)
NEUTROS ABS: 10.1 10*3/uL — AB (ref 1.7–7.7)
NEUTROS PCT: 79 % — AB (ref 43–77)
Platelets: 436 10*3/uL — ABNORMAL HIGH (ref 150–400)
RBC: 3.51 MIL/uL — AB (ref 3.87–5.11)
RDW: 11.6 % (ref 11.5–15.5)
WBC: 12.8 10*3/uL — ABNORMAL HIGH (ref 4.0–10.5)

## 2015-03-30 LAB — RAPID URINE DRUG SCREEN, HOSP PERFORMED
Amphetamines: NOT DETECTED
Barbiturates: NOT DETECTED
Benzodiazepines: NOT DETECTED
COCAINE: NOT DETECTED
OPIATES: NOT DETECTED
TETRAHYDROCANNABINOL: NOT DETECTED

## 2015-03-30 LAB — ETHANOL: ALCOHOL ETHYL (B): 6 mg/dL — AB (ref <5)

## 2015-03-30 MED ORDER — SULFAMETHOXAZOLE-TRIMETHOPRIM 800-160 MG PO TABS
1.0000 | ORAL_TABLET | Freq: Once | ORAL | Status: AC
  Administered 2015-03-30: 1 via ORAL
  Filled 2015-03-30: qty 1

## 2015-03-30 MED ORDER — HALOPERIDOL LACTATE 5 MG/ML IJ SOLN
10.0000 mg | Freq: Once | INTRAMUSCULAR | Status: AC
  Administered 2015-03-30: 10 mg via INTRAMUSCULAR
  Filled 2015-03-30: qty 2

## 2015-03-30 MED ORDER — SULFAMETHOXAZOLE-TRIMETHOPRIM 800-160 MG PO TABS
1.0000 | ORAL_TABLET | Freq: Two times a day (BID) | ORAL | Status: DC
  Administered 2015-03-30 – 2015-03-31 (×2): 1 via ORAL
  Filled 2015-03-30 (×2): qty 1

## 2015-03-30 MED ORDER — NICOTINE 21 MG/24HR TD PT24
21.0000 mg | MEDICATED_PATCH | Freq: Every day | TRANSDERMAL | Status: DC
  Administered 2015-03-31: 21 mg via TRANSDERMAL
  Filled 2015-03-30: qty 1

## 2015-03-30 MED ORDER — ACETAMINOPHEN 325 MG PO TABS
650.0000 mg | ORAL_TABLET | ORAL | Status: DC | PRN
  Administered 2015-03-30: 650 mg via ORAL
  Filled 2015-03-30: qty 2

## 2015-03-30 MED ORDER — LORAZEPAM 1 MG PO TABS: 1.0000 mg | ORAL_TABLET | Freq: Three times a day (TID) | ORAL | Status: DC | PRN

## 2015-03-30 MED ORDER — IBUPROFEN 400 MG PO TABS: 600.0000 mg | ORAL_TABLET | Freq: Three times a day (TID) | ORAL | Status: DC | PRN

## 2015-03-30 MED ORDER — ONDANSETRON HCL 4 MG PO TABS: 4.0000 mg | ORAL_TABLET | Freq: Three times a day (TID) | ORAL | Status: DC | PRN

## 2015-03-30 MED ORDER — ALUM & MAG HYDROXIDE-SIMETH 200-200-20 MG/5ML PO SUSP: 30.0000 mL | ORAL | Status: DC | PRN

## 2015-03-30 MED ORDER — ZOLPIDEM TARTRATE 5 MG PO TABS: 5.0000 mg | ORAL_TABLET | Freq: Every evening | ORAL | Status: DC | PRN

## 2015-03-30 NOTE — B[facility] Counselor (Signed)
Pt. Pending review with [facility] for placement. 

## 2015-03-30 NOTE — ED Notes (Signed)
Patient with visual hallucinations. Having conversation in the room by herself. Talking towards the wall. Patient with extreme mood swings, will be laughing and then next minute will be yelling and pointing at people.

## 2015-03-30 NOTE — Progress Notes (Addendum)
[facility] Writer spoke with RN Jill AlexandersJustin and was informed that patient has been asleep. Writer to follow up within 1-2 h for pt's behavior.   Patient was [facility] - per Jerilynn Somalvin, referral under review, will have updates on 6/30 [facility] - voicemail. [facility] - per Thayer Ohmhris, "Fax it for review, if we have bed, will give you a call." [facility], referral received, check again in am. [facility] - per Cathi RoanAlba, we may have one bed open, referral under review. [facility] - per intake, ok to fax. Per intake, "The psychiatrist will be here in am, please follow up then." [facility] - voicemail. [facility] - per intake, referral under review.  Declined at: [facility] - per Aimee, due to not taking medicaid for adults.  At capacity: [facility] - per Kathrine Cordsaimee, call back tomorrow in am, might have beds in am. [facility]   CSW will continue to seek placement.  [place] Abtsatia Andrez [place], [place] Disposition staff 03/30/2015 8:03 PM  [facility] - per intake, fax referral for review.  [facility] - might have beds tomorrow, will go through referrals tonight. [facility] - per intake, fax it for waitlist. [facility] - per Aimee, referral is pending. Aimee requested patient's clinicals, information was provided and medication list was faxed to [facility]. Per Aimee, patient will be for the waitlist for a [facility] bed. [facility] - per Victorino DikeJennifer, will have beds in am, fax it.

## 2015-03-30 NOTE — ED Notes (Signed)
Patient drowsy. Restlessness decreased. Warm blanket given, lights dimmed for comfort. Sitter remains at bedside.

## 2015-03-30 NOTE — BH Assessment (Signed)
Assessment Note  [name] is an 54 y.o. female presenting to [facility] with increased psychotic like symptoms. Patient reportedly called EMS because her legs were infected. Patient pouring water on her legs yelling, "Help me please help me"! Patient appears to be in a manic state with associated flight of ideas. She reports feeling extremely anxious and not sleeping well. Patient laughing and then next minute will be yelling and pointing at people. Patient denies SI and HI. She reports having auditory hallucinations of a man telling her, "Suck my hot dog and my ding dong". Patient asking this Clinical research associatewriter, "Isn't that illegal". Patient denies having a outpatient mental health provider. She reports having a nursing aid "Annice PihJackie with the black cadillac". [facility] but unable to provide related details. She reports taking all medications as prescribed. Patient reporting that her son is her guardian [name]([name]) [phone].  Writer contacted patient's guardian Alyssa PulleyWayne Rivera [phone] and left a voicemail.     Axis I: Psychotic Disorder NOS Axis II: Deferred Axis III:  Past Medical History  Diagnosis Date  . Diabetes mellitus without complication   . Hypertension   . Hypercholesterolemia    Axis IV: other psychosocial or environmental problems, problems related to social environment, problems with access to health care services and problems with primary support group Axis V: 31-40 impairment in reality testing  Past Medical History:  Past Medical History  Diagnosis Date  . Diabetes mellitus without complication   . Hypertension   . Hypercholesterolemia     History reviewed. No pertinent past surgical history.  Family History:  Family History  Problem Relation Age of Onset  . Asthma Mother   . Cancer Mother   . Hypertension Mother   . Hypertension Father   . Diabetes Father   . Stroke Brother     Social History:  reports that she has been smoking Cigarettes.   She has a 34 pack-year smoking history. She does not have any smokeless tobacco history on file. She reports that she drinks alcohol. She reports that she does not use illicit drugs.  Additional Social History:  Alcohol / Drug Use Pain Medications: SEE MAR Prescriptions: SEE MAR Over the Counter: SEE MAR History of alcohol / drug use?: No history of alcohol / drug abuse  CIWA: CIWA-Ar BP: 134/82 mmHg Pulse Rate: 101 COWS:    Allergies: No Known Allergies  Home Medications:  (Not in a hospital admission)  OB/GYN Status:  No LMP recorded. Patient is postmenopausal.  General Assessment Data [facility] of Assessment: [facility] Assessment: In system Is this a Tele or Face-to-Face Assessment?: Face-to-Face Is this an Initial Assessment or a Re-assessment for this encounter?: Initial Assessment Marital status: Single Maiden name:  [name]([place]) Is patient pregnant?: No Pregnancy Status: No Living Arrangements: Alone Can pt return to current living arrangement?: Yes Admission Status: Voluntary Is patient capable of signing voluntary admission?: Yes Referral Source: Self/Family/Friend Insurance type:  (Medicaid)     Crisis Care Plan Living Arrangements: Alone Name of Psychiatrist:  (No psychiatrist ) Name of Therapist:  (No therapist )  Education Status Is patient currently in school?: No Current Grade:  (n/a) Highest grade of school patient has completed:  (unk) Name of school:  (unk) Contact person:  (n/a)  Risk to self with the past 6 months Suicidal Ideation: No Has patient been a risk to self within the past 6 months prior to admission? : No Suicidal Intent: No Has patient had any suicidal intent  within the past 6 months prior to admission? : No Is patient at risk for suicide?: No Suicidal Plan?: No Has patient had any suicidal plan within the past 6 months prior to admission? : No Access to Means: No What has been your use of drugs/alcohol within the last 12 months?:   (n/a) Previous Attempts/Gestures: No How many times?:  (n/a) Other Self Harm Risks:  (n/a) Triggers for Past Attempts: Other (Comment) (n/a) Intentional Self Injurious Behavior: None Family Suicide History: No Recent stressful life event(s): Other (Comment) ("My legs are infected..I am in pain") Persecutory voices/beliefs?: No Depression: Yes Depression Symptoms: Feeling angry/irritable, Feeling worthless/self pity, Loss of interest in usual pleasures, Guilt, Fatigue, Isolating, Tearfulness, Insomnia, Despondent Substance abuse history and/or treatment for substance abuse?: No Suicide prevention information given to non-admitted patients: Not applicable  Risk to Others within the past 6 months Homicidal Ideation: No Does patient have any lifetime risk of violence toward others beyond the six months prior to admission? : No Thoughts of Harm to Others: No Current Homicidal Intent: No Current Homicidal Plan: No Access to Homicidal Means: No Identified Victim:  (n/a) History of harm to others?: No Assessment of Violence: None Noted Violent Behavior Description:  (patient calm and cooperative ) Does patient have access to weapons?: No Criminal Charges Pending?: No Does patient have a court date: No Is patient on probation?: No  Psychosis Hallucinations: None noted Delusions: None noted  Mental Status Report Appearance/Hygiene: Disheveled Eye Contact: Good Motor Activity: Freedom of movement Speech: Logical/coherent Level of Consciousness: Alert Mood: Depressed Affect: Appropriate to circumstance Anxiety Level: None Thought Processes: Coherent, Relevant Judgement: Unimpaired Orientation: Person, Place, Time, Situation Obsessive Compulsive Thoughts/Behaviors: None  Cognitive Functioning Concentration: Decreased Memory: Remote Intact, Recent Intact IQ: Average Insight: Fair Impulse Control: Fair Appetite: Poor Weight Loss:  (none reported) Weight Gain:  (none  reported) Sleep: Decreased Total Hours of Sleep:  (varies) Vegetative Symptoms: None  ADLScreening [facility] Assessment Services) Patient's cognitive ability adequate to safely complete daily activities?: Yes Patient able to express need for assistance with ADLs?: Yes Independently performs ADLs?: Yes (appropriate for developmental age)  Prior Inpatient Therapy Prior Inpatient Therapy: No Prior Therapy Dates:  (n/a) Prior Therapy Facilty/Provider(s):  (n/a) Reason for Treatment:  (n/a)  Prior Outpatient Therapy Prior Outpatient Therapy: No Prior Therapy Dates:  (n/a) Prior Therapy Facilty/Provider(s):  (n/a) Reason for Treatment:  (n/a) Does patient have an ACCT team?: No Does patient have Intensive In-House Services?  : No Does patient have [facility] services? : No Does patient have P4CC services?: No  ADL Screening (condition at time of admission) Patient's cognitive ability adequate to safely complete daily activities?: Yes Is the patient deaf or have difficulty hearing?: No Does the patient have difficulty seeing, even when wearing glasses/contacts?: No Does the patient have difficulty concentrating, remembering, or making decisions?: No Patient able to express need for assistance with ADLs?: Yes Does the patient have difficulty dressing or bathing?: No Independently performs ADLs?: Yes (appropriate for developmental age) Does the patient have difficulty walking or climbing stairs?: No Weakness of Legs: None Weakness of Arms/Hands: None  Home Assistive Devices/Equipment Home Assistive Devices/Equipment: None    Abuse/Neglect Assessment (Assessment to be complete while patient is alone) Physical Abuse: Denies Verbal Abuse: Denies Sexual Abuse: Denies Exploitation of patient/patient's resources: Denies Self-Neglect: Denies Values / Beliefs Cultural Requests During Hospitalization: None Spiritual Requests During Hospitalization: None   Advance Directives (For  Healthcare) Does patient have an advance directive?: No    Additional  Information 1:1 In Past 12 Months?: No CIRT Risk: No Elopement Risk: No Does patient have medical clearance?: Yes     Disposition:  Disposition Initial Assessment Completed for this Encounter: Yes Disposition of Patient: Inpatient treatment program Renata Caprice, NP recommends inpatient treatment.) Type of inpatient treatment program: Adult  On Site Evaluation by:   Reviewed with Physician:    Melynda Ripple [facility] 03/30/2015 12:36 PM

## 2015-03-30 NOTE — ED Notes (Signed)
Patient changed into paper scrubs per policy.  Belongings removed from room: 1 black [employer] cell phone, 1 grey t-shirt, 1 pair of jean shorts, 1 pair of flip flops, 1 bedroom shoe, 1 laynard of keys, 1 pocketbook.   Security at bedside to secure patient's wallet and cash.

## 2015-03-30 NOTE — ED Notes (Signed)
Urinated using bedside commode without difficulty. Bed changed due to soiled linen. New paper scrubs provided.
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
## 2015-03-30 NOTE — ED Notes (Signed)
Patient provided water and diet coke at patient request. Patient sitting up to side of bed eating dinner at this time

## 2015-03-30 NOTE — ED Provider Notes (Signed)
CSN: [id number]     Arrival date & time 03/30/15  1011 History  This chart was scribed for Alyssa HutchingBrian Doral Ventrella, MD by Marica OtterNusrat Rahman, ED Scribe. This patient was seen in room APA16A/APA16A and the patient's care was started at 10:26 AM.  LEVEL 5 CAVEAT: Psychiatric Disorder Chief Complaint  Patient presents with  . V70.1   HPI PCP: PROVIDER NOT IN SYSTEM HPI Comments: [name] is a 54 y.o. female, with PMH noted below, brought in by ambulance, who presents to the Emergency Department for pain in left leg.   Pt notes she was seen for her leg pain a couple of days ago and is unable to clearly indicate if she started meds that were prescribed at that time.   Pt also complains of increased anxiety onset this morning when an individual was making inappropriate sexual comments to her and calling her names. Patient is talking to herself with mood swings.  Past Medical History  Diagnosis Date  . Diabetes mellitus without complication   . Hypertension   . Hypercholesterolemia    History reviewed. No pertinent past surgical history. Family History  Problem Relation Age of Onset  . Asthma Mother   . Cancer Mother   . Hypertension Mother   . Hypertension Father   . Diabetes Father   . Stroke Brother    History  Substance Use Topics  . Smoking status: Current Every Day Smoker -- 1.00 packs/day for 34 years    Types: Cigarettes  . Smokeless tobacco: Not on file  . Alcohol Use: Yes     Comment: occasionally   OB History    Gravida Para Term Preterm AB TAB SAB Ectopic Multiple Living   1 1             Review of Systems  Unable to perform ROS: Psychiatric disorder      Allergies  Review of patient's allergies indicates no known allergies.  Home Medications   Prior to Admission medications   Medication Sig Start Date End Date Taking? Authorizing Provider  acetaminophen (TYLENOL) 500 MG tablet Take 500 mg by mouth every 6 (six) hours as needed for pain.    Historical Provider, MD   aspirin 325 MG tablet Take 325 mg by mouth daily. PT SAID SHE TAKES 2 DAILY FOR COLD AND COLD LIKE SYMPTOMS    Historical Provider, MD  glipiZIDE-metformin (METAGLIP) 5-500 MG per tablet Take 1 tablet by mouth 2 (two) times daily before a meal. TAKES ONE AT LUNCH AND ONE AT [facility]    Historical Provider, MD  insulin glargine (LANTUS) 100 UNIT/ML injection Inject 15 Units into the skin at bedtime.    Historical Provider, MD  lisinopril-hydrochlorothiazide (PRINZIDE,ZESTORETIC) 20-12.5 MG per tablet Take 1 tablet by mouth daily.    Historical Provider, MD  nicotine (NICODERM CQ - DOSED IN MG/24 HOURS) 21 mg/24hr patch Place 1 patch onto the skin daily. Patient not taking: Reported on 12/09/2014 06/28/13   Renae FickleMackenzie Short, MD  pravastatin (PRAVACHOL) 40 MG tablet Take 40 mg by mouth every evening.    Historical Provider, MD  sulfamethoxazole-trimethoprim (BACTRIM DS,SEPTRA DS) 800-160 MG per tablet Take 1 tablet by mouth 2 (two) times daily. 03/28/15 04/04/15  Alyssa HutchingBrian Afiya Ferrebee, MD   Triage Vitals: BP 134/82 mmHg  Pulse 101  Temp(Src) 98.7 F (37.1 C) (Oral)  Resp 20  SpO2 100% Physical Exam  Constitutional: She is oriented to person, place, and time. She appears well-developed and well-nourished.  Non stop talking with  no specific pattern.   HENT:  Head: Normocephalic and atraumatic.  Eyes: Conjunctivae and EOM are normal. Pupils are equal, round, and reactive to light.  Neck: Normal range of motion. Neck supple.  Cardiovascular: Normal rate and regular rhythm.   Pulmonary/Chest: Effort normal and breath sounds normal.  Abdominal: Soft. Bowel sounds are normal.  Musculoskeletal: Normal range of motion.  Area of erythema from left knee distally to left foot.   Neurological: She is alert and oriented to person, place, and time.  Skin: Skin is warm and dry.  Psychiatric:  Flight of ideas, tangential speech pattern.   Nursing note and vitals reviewed.   ED Course  Procedures (including  critical care time) DIAGNOSTIC STUDIES: Oxygen Saturation is 100% on RA, nl by my interpretation.    COORDINATION OF CARE: 10:29 AM-Discussed treatment plan with pt at bedside.  Labs Review Labs Reviewed  BASIC METABOLIC PANEL - Abnormal; Notable for the following:    Sodium 127 (*)    Chloride 91 (*)    Glucose, Bld 153 (*)    Calcium 8.8 (*)    All other components within normal limits  CBC WITH DIFFERENTIAL/PLATELET - Abnormal; Notable for the following:    WBC 12.8 (*)    RBC 3.51 (*)    Hemoglobin 10.8 (*)    HCT 31.3 (*)    Platelets 436 (*)    Neutrophils Relative % 79 (*)    Neutro Abs 10.1 (*)    All other components within normal limits  ETHANOL - Abnormal; Notable for the following:    Alcohol, Ethyl (B) 6 (*)    All other components within normal limits  URINE RAPID DRUG SCREEN, HOSP PERFORMED    Imaging Review No results found.   EKG Interpretation None      MDM   Final diagnoses:  Bipolar 1 disorder  Cellulitis of left lower extremity   Patient appears to be having a manic episode. 10 mg Haldol IM given. Behavioral health consult obtained. Septra DS twice a day started for left leg cellulitis.  I personally performed the services described in this documentation, which was scribed in my presence. The recorded information has been reviewed and is accurate.     Alyssa Hutching, MD 03/30/15 1414

## 2015-03-30 NOTE — BH Assessment (Signed)
Collateral Information:   Clinical research associateWriter received a call from patient's son Lyndal PulleyWayne Denny #[phone]. Sts that he is not patient's legal guardian but his girlfriend is her payee. Patient sts that he doesn't have much of a relationship with his mother. He was raised by his grandparents. sts that patient is "very childlike" and has always been that way. He provides a example, "I told her not to touch something and she did it anyway just to get attention". Sts that patient is also a "hypochondriac". Patient will hear one thing and take it way to far, per her son. He provides an example, "If you tell her she has bronchitis she will automatically assume it's lung cancer". He also sts, "If you tell her she has a foot infection she will assume her leg is going to be amputated". Patient has high anxiety. Son sts that she [place] [facility] [place] for a "nervous breakdown" related to a breakup. Son also ask if his mother is in a wheelchair. Sts, "My mom was rambling about a wheelchair the last time I spoke to her". Son reports that his mother should not be in a wheelchair b/c she is able to ambulate well. He reports that his mother is able to complete all her ADL's but will play as if she is unable to do so.

## 2015-03-31 ENCOUNTER — Inpatient Hospital Stay
Admission: EM | Admit: 2015-03-31 | Discharge: 2015-04-05 | DRG: 885 | Disposition: A | Discharge status: Home / Self Care | Payer: Medicaid Other | Source: Other Acute Inpatient Hospital | Attending: Psychiatry | Admitting: Psychiatry

## 2015-03-31 DIAGNOSIS — L03116 Cellulitis of left lower limb: Secondary | ICD-10-CM | POA: Diagnosis present

## 2015-03-31 DIAGNOSIS — Z823 Family history of stroke: Secondary | ICD-10-CM | POA: Diagnosis not present

## 2015-03-31 DIAGNOSIS — Z809 Family history of malignant neoplasm, unspecified: Secondary | ICD-10-CM

## 2015-03-31 DIAGNOSIS — Z8249 Family history of ischemic heart disease and other diseases of the circulatory system: Secondary | ICD-10-CM

## 2015-03-31 DIAGNOSIS — F29 Unspecified psychosis not due to a substance or known physiological condition: Secondary | ICD-10-CM | POA: Diagnosis present

## 2015-03-31 DIAGNOSIS — L97509 Non-pressure chronic ulcer of other part of unspecified foot with unspecified severity: Secondary | ICD-10-CM

## 2015-03-31 DIAGNOSIS — E780 Pure hypercholesterolemia: Secondary | ICD-10-CM | POA: Diagnosis present

## 2015-03-31 DIAGNOSIS — L97519 Non-pressure chronic ulcer of other part of right foot with unspecified severity: Secondary | ICD-10-CM | POA: Diagnosis present

## 2015-03-31 DIAGNOSIS — E785 Hyperlipidemia, unspecified: Secondary | ICD-10-CM | POA: Diagnosis present

## 2015-03-31 DIAGNOSIS — E8881 Metabolic syndrome: Secondary | ICD-10-CM | POA: Diagnosis present

## 2015-03-31 DIAGNOSIS — F3110 Bipolar disorder, current episode manic without psychotic features, unspecified: Secondary | ICD-10-CM | POA: Diagnosis not present

## 2015-03-31 DIAGNOSIS — I10 Essential (primary) hypertension: Secondary | ICD-10-CM | POA: Diagnosis present

## 2015-03-31 DIAGNOSIS — Z794 Long term (current) use of insulin: Secondary | ICD-10-CM

## 2015-03-31 DIAGNOSIS — E11622 Type 2 diabetes mellitus with other skin ulcer: Secondary | ICD-10-CM | POA: Diagnosis present

## 2015-03-31 DIAGNOSIS — Z833 Family history of diabetes mellitus: Secondary | ICD-10-CM

## 2015-03-31 DIAGNOSIS — R451 Restlessness and agitation: Secondary | ICD-10-CM | POA: Diagnosis present

## 2015-03-31 DIAGNOSIS — F17210 Nicotine dependence, cigarettes, uncomplicated: Secondary | ICD-10-CM | POA: Diagnosis present

## 2015-03-31 DIAGNOSIS — E11621 Type 2 diabetes mellitus with foot ulcer: Secondary | ICD-10-CM

## 2015-03-31 DIAGNOSIS — E119 Type 2 diabetes mellitus without complications: Secondary | ICD-10-CM

## 2015-03-31 DIAGNOSIS — Z7982 Long term (current) use of aspirin: Secondary | ICD-10-CM

## 2015-03-31 DIAGNOSIS — Z79899 Other long term (current) drug therapy: Secondary | ICD-10-CM

## 2015-03-31 DIAGNOSIS — L02619 Cutaneous abscess of unspecified foot: Secondary | ICD-10-CM | POA: Diagnosis present

## 2015-03-31 DIAGNOSIS — L97529 Non-pressure chronic ulcer of other part of left foot with unspecified severity: Secondary | ICD-10-CM | POA: Diagnosis present

## 2015-03-31 DIAGNOSIS — Z720 Tobacco use: Secondary | ICD-10-CM | POA: Diagnosis present

## 2015-03-31 DIAGNOSIS — Z825 Family history of asthma and other chronic lower respiratory diseases: Secondary | ICD-10-CM

## 2015-03-31 DIAGNOSIS — F319 Bipolar disorder, unspecified: Secondary | ICD-10-CM | POA: Diagnosis present

## 2015-03-31 DIAGNOSIS — L03119 Cellulitis of unspecified part of limb: Secondary | ICD-10-CM

## 2015-03-31 LAB — CBG MONITORING, ED: Glucose-Capillary: 164 mg/dL — ABNORMAL HIGH (ref 65–99)

## 2015-03-31 LAB — GLUCOSE, CAPILLARY: Glucose-Capillary: 175 mg/dL — ABNORMAL HIGH (ref 65–99)

## 2015-03-31 MED ORDER — ALUM & MAG HYDROXIDE-SIMETH 200-200-20 MG/5ML PO SUSP
30.0000 mL | ORAL | Status: DC | PRN
  Administered 2015-04-01: 30 mL via ORAL
  Filled 2015-03-31: qty 30

## 2015-03-31 MED ORDER — INSULIN GLARGINE 100 UNIT/ML ~~LOC~~ SOLN
18.0000 [IU] | Freq: Every day | SUBCUTANEOUS | Status: DC
  Administered 2015-03-31 – 2015-04-04 (×5): 18 [IU] via SUBCUTANEOUS
  Filled 2015-03-31 (×6): qty 0.18

## 2015-03-31 MED ORDER — NICOTINE 21 MG/24HR TD PT24: 21.0000 mg | MEDICATED_PATCH | Freq: Every day | TRANSDERMAL | Status: DC

## 2015-03-31 MED ORDER — ACETAMINOPHEN 325 MG PO TABS
650.0000 mg | ORAL_TABLET | Freq: Four times a day (QID) | ORAL | Status: DC | PRN
  Administered 2015-03-31 – 2015-04-04 (×5): 650 mg via ORAL
  Filled 2015-03-31 (×5): qty 2

## 2015-03-31 MED ORDER — TRAZODONE HCL 100 MG PO TABS
100.0000 mg | ORAL_TABLET | Freq: Every day | ORAL | Status: DC
  Administered 2015-03-31 – 2015-04-04 (×5): 100 mg via ORAL
  Filled 2015-03-31 (×5): qty 1

## 2015-03-31 MED ORDER — MAGNESIUM HYDROXIDE 400 MG/5ML PO SUSP: 30.0000 mL | Freq: Every day | ORAL | Status: DC | PRN

## 2015-03-31 MED ORDER — LORAZEPAM 1 MG PO TABS
1.0000 mg | ORAL_TABLET | Freq: Four times a day (QID) | ORAL | Status: DC | PRN
  Administered 2015-03-31: 2 mg via ORAL
  Administered 2015-04-01 – 2015-04-03 (×2): 1 mg via ORAL
  Filled 2015-03-31: qty 1
  Filled 2015-03-31: qty 2
  Filled 2015-03-31: qty 1

## 2015-03-31 NOTE — ED Notes (Signed)
Called [facility] to check status of pt placement.   Bed pt was supposed to receive has been filled by pt at [facility].

## 2015-03-31 NOTE — ED Notes (Signed)
Pt reports that she urinated in her bed.  Pt has BSC at bedside, and pt was informed to let nurse or nurse tech know if she needed to use it.  Pt given new scrubs and linens were changed.

## 2015-03-31 NOTE — ED Notes (Signed)
Meal given

## 2015-03-31 NOTE — Plan of Care (Signed)
Problem: Alteration in thought process Goal: LTG-Patient has not harmed self or others in at least 2 days Outcome: Not Progressing No injuries noted. No voiced thoughts of harming herself. q 15 min checks maintained for safety.

## 2015-03-31 NOTE — ED Notes (Signed)
Called [employer] to Dispatch an officer to transfer Pt to [facility].

## 2015-03-31 NOTE — ED Notes (Signed)
Pt leaving with police to [facility].

## 2015-03-31 NOTE — Progress Notes (Addendum)
[facility], to Dr. Jennet MaduroPucilowska, bed 301, pt can come any time, call report at [phone].  Pt's IVC papers to be faxed to Tressa at [facility] to fax#: [phone].  RN Baron HamperMeagan was informed.  Melbourne Abtsatia Lendon George, LCSWA Disposition staff 03/31/2015 3:59 PM

## 2015-03-31 NOTE — ED Notes (Signed)
IVC paperwork fax[facility].

## 2015-03-31 NOTE — Progress Notes (Signed)
   Patient [facility] - per [name], referral under [facility]. [facility] -   [facility] -  Resent referral for review [facility]- under review  [facility] left. Resent referral [facility] -Resent referral for review  Declined at: [facility] - per [name], due to not taking medicaid for adults. [facility] -denied due to her symptoms [facility] - declined by their provider  At capacity:  [facility]  [facility]    CSW will continue to seek placement.  Alyssa LevyJanet Leinaala Rivera, MSW, Theresia MajorsLCSWA  [phone]

## 2015-04-01 ENCOUNTER — Encounter: Payer: Self-pay | Admitting: Psychiatry

## 2015-04-01 DIAGNOSIS — F3110 Bipolar disorder, current episode manic without psychotic features, unspecified: Secondary | ICD-10-CM

## 2015-04-01 LAB — GLUCOSE, CAPILLARY
GLUCOSE-CAPILLARY: 130 mg/dL — AB (ref 65–99)
GLUCOSE-CAPILLARY: 144 mg/dL — AB (ref 65–99)
Glucose-Capillary: 187 mg/dL — ABNORMAL HIGH (ref 65–99)

## 2015-04-01 MED ORDER — RISPERIDONE 1 MG PO TABS
2.0000 mg | ORAL_TABLET | Freq: Two times a day (BID) | ORAL | Status: DC
  Administered 2015-04-01 – 2015-04-05 (×9): 2 mg via ORAL
  Filled 2015-04-01 (×2): qty 2
  Filled 2015-04-01: qty 1
  Filled 2015-04-01 (×3): qty 2
  Filled 2015-04-01: qty 1
  Filled 2015-04-01 (×3): qty 2

## 2015-04-01 MED ORDER — COLLAGENASE 250 UNIT/GM EX OINT
TOPICAL_OINTMENT | Freq: Every day | CUTANEOUS | Status: DC
  Administered 2015-04-01: 16:00:00 via TOPICAL
  Administered 2015-04-02: 1 via TOPICAL
  Administered 2015-04-03 – 2015-04-05 (×3): via TOPICAL
  Filled 2015-04-01 (×2): qty 30

## 2015-04-01 MED ORDER — SULFAMETHOXAZOLE-TRIMETHOPRIM 800-160 MG PO TABS
1.0000 | ORAL_TABLET | Freq: Two times a day (BID) | ORAL | Status: AC
  Administered 2015-04-01 – 2015-04-04 (×8): 1 via ORAL
  Filled 2015-04-01 (×8): qty 1

## 2015-04-01 MED ORDER — LIVING WELL WITH DIABETES BOOK
Freq: Once | Status: AC
  Administered 2015-04-01: 16:00:00
  Filled 2015-04-01 (×2): qty 1

## 2015-04-01 MED ORDER — PRAVASTATIN SODIUM 10 MG PO TABS
40.0000 mg | ORAL_TABLET | Freq: Every day | ORAL | Status: DC
  Administered 2015-04-01 – 2015-04-03 (×3): 40 mg via ORAL
  Filled 2015-04-01 (×3): qty 4

## 2015-04-01 MED ORDER — LISINOPRIL 20 MG PO TABS
20.0000 mg | ORAL_TABLET | Freq: Every day | ORAL | Status: DC
  Administered 2015-04-01: 20 mg via ORAL
  Filled 2015-04-01 (×2): qty 1

## 2015-04-01 MED ORDER — HYDROCHLOROTHIAZIDE 12.5 MG PO CAPS
12.5000 mg | ORAL_CAPSULE | Freq: Every day | ORAL | Status: DC
  Administered 2015-04-01 – 2015-04-02 (×2): 12.5 mg via ORAL
  Filled 2015-04-01 (×2): qty 1

## 2015-04-01 MED ORDER — OMEGA-3-ACID ETHYL ESTERS 1 G PO CAPS
1.0000 g | ORAL_CAPSULE | Freq: Two times a day (BID) | ORAL | Status: DC
  Administered 2015-04-01 – 2015-04-05 (×9): 1 g via ORAL
  Filled 2015-04-01 (×9): qty 1

## 2015-04-01 MED ORDER — VALPROATE SODIUM 500 MG/5ML IV SOLN
500.0000 mg | Freq: Two times a day (BID) | INTRAVENOUS | Status: DC
  Filled 2015-04-01: qty 5

## 2015-04-01 MED ORDER — INSULIN ASPART 100 UNIT/ML ~~LOC~~ SOLN
0.0000 [IU] | Freq: Three times a day (TID) | SUBCUTANEOUS | Status: DC
  Administered 2015-04-02: 5 [IU] via SUBCUTANEOUS
  Administered 2015-04-02: 1 [IU] via SUBCUTANEOUS
  Administered 2015-04-03 – 2015-04-04 (×2): 2 [IU] via SUBCUTANEOUS
  Administered 2015-04-05: 5 [IU] via SUBCUTANEOUS
  Filled 2015-04-01 (×3): qty 2
  Filled 2015-04-01: qty 5
  Filled 2015-04-01: qty 1
  Filled 2015-04-01: qty 5

## 2015-04-01 MED ORDER — NICOTINE 21 MG/24HR TD PT24
21.0000 mg | MEDICATED_PATCH | Freq: Every day | TRANSDERMAL | Status: DC
  Administered 2015-04-02 – 2015-04-05 (×4): 21 mg via TRANSDERMAL
  Filled 2015-04-01 (×4): qty 1

## 2015-04-01 MED ORDER — DIVALPROEX SODIUM 500 MG PO DR TAB
500.0000 mg | DELAYED_RELEASE_TABLET | Freq: Two times a day (BID) | ORAL | Status: DC
  Administered 2015-04-01 – 2015-04-05 (×9): 500 mg via ORAL
  Filled 2015-04-01 (×10): qty 1

## 2015-04-01 NOTE — Progress Notes (Addendum)
Inpatient Diabetes Program Recommendations  AACE/ADA: New Consensus Statement on Inpatient Glycemic Control (2013)  Target Ranges:  Prepandial:   less than 140 mg/dL      Peak postprandial:   less than 180 mg/dL (1-2 hours)      Critically ill patients:  140 - 180 mg/dL   Results for [name], [name] (MRN [id number]) as of 04/01/2015 09:18  Ref. Range 06/27/2013 16:52 06/27/2013 21:12 06/28/2013 07:17 03/31/2015 12:28 03/31/2015 20:28  Glucose-Capillary Latest Ref Range: 65-99 mg/dL 409195 (H) 811136 (H) 914171 (H) 164 (H) 175 (H)    Reason for Visit: elevated CBG  Diabetes history: Type 2 Outpatient Diabetes medications: Lantus 15 units qhs, Glipizide/Metformin 5/500mg  1 tid  Please consider checking CBG tid hs and ordering Novolog sensitive correction scale 0-9 units tid and 0-5 units qhs or consider restarting her on Glyburide and Metformin.   Living well with Diabetes book ordered for the patient- staff please review with patient as appropriate.   Spoke to RN who will discuss diabetes needs with the patient and MD- RN did not feel the consult for diabetes was warranted at this time and I have therefore completed it.   Susette RacerJulie Ayeshia Coppin, RN, BA, MHA, CDE Diabetes Coordinator Inpatient Diabetes Program  [phone] (Team Pager) [phone] [facility] Office) 04/01/2015 9:23 AM

## 2015-04-01 NOTE — [facility] Suicide Risk Assessment (Signed)
[facility]   Nursing information obtained from:    Demographic factors:  Caucasian, Low socioeconomic status, Living alone Current Mental Status:  NA Loss Factors:  Financial problems / change in socioeconomic status Historical Factors:    Risk Reduction Factors:    Total Time spent with patient: 1 hour Principal Problem: <principal problem not specified> Diagnosis:   Patient Active Problem List   Diagnosis Date Noted  . Psychosis [F29] 03/31/2015  . Unspecified constipation [K59.00] 06/26/2013  . Nausea and vomiting [R11.2] 06/25/2013  . Cellulitis and abscess of foot [L03.119, L02.619] 06/23/2013  . DM (diabetes mellitus) [E11.9] 06/23/2013  . HTN (hypertension) [I10] 06/23/2013  . Tobacco abuse [Z72.0] 06/23/2013  . Hyperlipidemia [E78.5] 06/23/2013     Continued Clinical Symptoms:    The "Alcohol Use Disorders Identification Test", Guidelines for Use in Primary Care, Second Edition.  World Science writerHealth Organization [facility](WHO). Score between 0-7:  no or low risk or alcohol related problems. Score between 8-15:  moderate risk of alcohol related problems. Score between 16-19:  high risk of alcohol related problems. Score 20 or above:  warrants further diagnostic evaluation for alcohol dependence and treatment.   CLINICAL FACTORS:   Bipolar Disorder:   Mixed State   Musculoskeletal: Strength & Muscle Tone: within normal limits Gait & Station: normal Patient leans: N/A  Psychiatric Specialty Exam: Physical Exam  Nursing note and vitals reviewed. Constitutional: She is oriented to person, place, and time. She appears well-developed and well-nourished.  HENT:  Head: Normocephalic and atraumatic.  Eyes: Conjunctivae and EOM are normal. Pupils are equal, round, and reactive to light.  Neck: Normal range of motion. Neck supple.  Cardiovascular: Normal rate, regular rhythm and normal heart sounds.   Respiratory: Effort normal and breath sounds normal.   GI: Soft. Bowel sounds are normal.  Musculoskeletal: Normal range of motion.  Neurological: She is alert and oriented to person, place, and time.  Skin: Skin is warm and dry.    Review of Systems  Musculoskeletal: Positive for joint pain.  All other systems reviewed and are negative.   Blood pressure 120/75, pulse 105, temperature 98.6 F (37 C), temperature source Oral, resp. rate 20, height 5\' 1"  (1.549 m), weight 68.04 kg (150 lb), SpO2 97 %.Body mass index is 28.36 kg/(m^2).  General Appearance: Disheveled  Eye SolicitorContact::  Fair  Speech:  Garbled  Volume:  Normal  Mood:  Dysphoric  Affect:  Inappropriate  Thought Process:  Disorganized  Orientation:  Full (Time, Place, and Person)  Thought Content:  Delusions and Paranoid Ideation  Suicidal Thoughts:  No  Homicidal Thoughts:  No  Memory:  Immediate;   Fair Recent;   Fair Remote;   Fair  Judgement:  Fair  Insight:  Lacking  Psychomotor Activity:  Increased  Concentration:  Fair  Recall:  Fis[employer]Fair  Fund of Knowledge:Fair  Language: Fair  Akathisia:  No  Handed:  Right  AIMS (if indicated):     Assets:  Communication Skills Desire for Improvement  Sleep:     Cognition: WNL  ADL's:  Intact     COGNITIVE FEATURES THAT CONTRIBUTE TO RISK:  None    SUICIDE RISK:   Moderate:  Frequent suicidal ideation with limited intensity, and duration, some specificity in terms of plans, no associated intent, good self-control, limited dysphoria/symptomatology, some risk factors present, and identifiable protective factors, including available and accessible social support.  PLAN OF CARE: Hospital admission, medication management, discharge planning.  Medical Decision Making:  New  problem, with additional work up planned, Review of Psycho-Social Stressors (1), Review or order clinical lab tests (1), Review of Medication Regimen & Side Effects (2) and Review of New Medication or Change in Dosage (2)   [name] is a 54 year old  female with unknown psychiatric history who was admitted to the hospital for disorganized, sexualized, bizarre behavior suggestive of bipolar mania in the context of treatment noncompliance.  1. Psychosis and mood. She was started on a combination of Depakote and Risperdal. We do not have any history of previous medication trials. I placed several phone calls to her guardian who is her son at the number is [phone] but the was unable to talk to the guardian.  2. Agitation. At [facility] emergency room the patient was agitated and receive an injection of Haldol once. No unwanted behavior observed on the unit at present.  3. Diabetes. The patient and takes a combination of metformin and Lantus. Will continue with blood glucose monitoring.  4. Hypertension. She is on a combination of hydrochlorothiazide and lisinopril.  5. Dyslipidemia. Pravastatin 40 mg daily.  6. Social social. The patient is an incompetent adult. We are guessing that this is from mental illness. We'll continue to try to contact the guardian.  7. Disposition. She will likely be discharged to home. She will follow up with her regular provider who is unknown to [place] at present.  I certify that inpatient services furnished can reasonably be expected to improve the patient's condition.   [name] 04/01/2015, 12:18 PM

## 2015-04-01 NOTE — Progress Notes (Signed)
Patient has been alert and oriented x 3.  She has been visible in milieu. For the first part of the shift.   Medication compliant.  Hospital ist notified for wound consult need Left foot.  Patient reports that she got sick and vomited once after lunch.  No further signs of distress noted. Will cont to monitor.  .Marland Kitchen

## 2015-04-01 NOTE — Consult Note (Cosign Needed)
[facility]  Medical Consultation  Alyssa PlyBarbara A Rivera ZOX:[id number] DOB: 02/25/1961 DOA: 03/31/2015 PCP: PROVIDER NOT IN SYSTEM   Requesting physician: Pucilowska Date of consultation:  04/01/2015 Reason for consultation: b/l foot ulcer  CHIEF COMPLAINT:  No chief complaint on file.   HISTORY OF PRESENT ILLNESS: Alyssa ButtsBarbara Rivera  is a 54 y.o. female with a known history of  Diabetes, hypertension,Hypercholesterolemia and psychosis who is currently admitted by behavioral medicine who has had a foot ulcer on plantar aspect both lower extremities for a long long period of time. She also is currently on antabiotics for infection of the foot with bactrim.  She reports that she has never had any surgeries to her foot. Has not had any fevers chills no chest pain or shortness of breath    PAST MEDICAL HISTORY:   Past Medical History  Diagnosis Date  . Diabetes mellitus without complication   . Hypertension   . Hypercholesterolemia     PAST SURGICAL HISTORY: History reviewed. No pertinent past surgical history.  SOCIAL HISTORY:  History  Substance Use Topics  . Smoking status: Current Every Day Smoker -- 1.00 packs/day for 34 years    Types: Cigarettes  . Smokeless tobacco: Not on file  . Alcohol Use: Yes     Comment: occasionally    FAMILY HISTORY:  Family History  Problem Relation Age of Onset  . Asthma Mother   . Cancer Mother   . Hypertension Mother   . Hypertension Father   . Diabetes Father   . Stroke Brother     DRUG ALLERGIES: No Known Allergies  REVIEW OF SYSTEMS:   CONSTITUTIONAL: No fever, fatigue or weakness.  EYES: No blurred or double vision.  EARS, NOSE, AND THROAT: No tinnitus or ear pain.  RESPIRATORY: No cough, shortness of breath, wheezing or hemoptysis.  CARDIOVASCULAR: No chest pain, orthopnea, edema.  GASTROINTESTINAL: No nausea, vomiting, diarrhea or abdominal pain.  GENITOURINARY: No dysuria, hematuria.  ENDOCRINE: No polyuria, nocturia,   HEMATOLOGY: No anemia, easy bruising or bleeding SKIN: bilateral ulcers on both feet on the plantar aspect TAL: No joint pain or arthritis.   NEUROLOGIC: No tingling, numbness, weakness.  PSYCHIATRY: No anxiety or depression.   MEDICATIONS AT HOME:  Prior to Admission medications   Medication Sig Start Date End Date Taking? Authorizing Provider  acetaminophen (TYLENOL) 500 MG tablet Take 500 mg by mouth every 6 (six) hours as needed for pain.   Yes Historical Provider, MD  insulin glargine (LANTUS) 100 UNIT/ML injection Inject 15 Units into the skin at bedtime.   Yes Historical Provider, MD  lisinopril-hydrochlorothiazide (PRINZIDE,ZESTORETIC) 20-12.5 MG per tablet Take 1 tablet by mouth daily.   Yes Historical Provider, MD  pravastatin (PRAVACHOL) 40 MG tablet Take 40 mg by mouth every evening.   Yes Historical Provider, MD  aspirin 325 MG tablet Take 325 mg by mouth daily.     Historical Provider, MD  glipiZIDE-metformin (METAGLIP) 5-500 MG per tablet Take 1 tablet by mouth 2 (two) times daily before a meal. TAKES ONE AT LUNCH AND ONE AT [facility]    Historical Provider, MD  Omega-3 Fatty Acids (FISH OIL PO) Take 1 capsule by mouth daily.    Historical Provider, MD  sulfamethoxazole-trimethoprim (BACTRIM DS,SEPTRA DS) 800-160 MG per tablet Take 1 tablet by mouth 2 (two) times daily. Patient not taking: Reported on 03/31/2015 03/28/15 04/04/15  Donnetta HutchingBrian Cook, MD      PHYSICAL EXAMINATION:   VITAL SIGNS: Blood pressure 120/75, pulse 105, temperature 98.6  F (37 C), temperature source Oral, resp. rate 20, height 5\' 1"  (1.549 m), weight 68.04 kg (150 lb), SpO2 97 %.  GENERAL:  54 y.o.-year-old patient lying in the bed with no acute distress.  EYES: Pupils equal, round, reactive to light and accommodation. No scleral icterus. Extraocular muscles intact.  HEENT: Head atraumatic, normocephalic. Oropharynx and nasopharynx clear.  NECK:  Supple, no jugular venous distention. No thyroid  enlargement, no tenderness.  LUNGS: Normal breath sounds bilaterally, no wheezing, rales,rhonchi or crepitation. No use of accessory muscles of respiration.  CARDIOVASCULAR: S1, S2 normal. No murmurs, rubs, or gallops.  ABDOMEN: Soft, nontender, nondistended. Bowel sounds present. No organomegaly or mass.  EXTREMITIES: No pedal edema, cyanosis, or clubbing.  NEUROLOGIC: Cranial nerves II through XII are intact. Muscle strength 5/5 in all extremities. Sensation intact. Gait not checked.  PSYCHIATRIC: The patient is alert and oriented x 3.  SKIN:left foot on the plantar aspect there is greater than dollar size ulcer open with white purulent material which is dry, she also has erythema extending up near her ankles . On the right foot she does have another small ulcer on plantar aspect size of a quarter   Labrotory pael:   CBC  Recent Labs Lab 03/30/15 1105  WBC 12.8*  HGB 10.8*  HCT 31.3*  PLT 436*  MCV 89.2  MCH 30.8  MCHC 34.5  RDW 11.6  LYMPHSABS 1.9  MONOABS 0.7  EOSABS 0.1  BASOSABS 0.0   ------------------------------------------------------------------------------------------------------------------  Chemistries   Recent Labs Lab 03/30/15 1105  NA 127*  K 4.5  CL 91*  CO2 28  GLUCOSE 153*  BUN 10  CREATININE 0.91  CALCIUM 8.8*   ------------------------------------------------------------------------------------------------------------------ estimated creatinine clearance is 63.1 mL/min (by C-G formula based on Cr of 0.91). ------------------------------------------------------------------------------------------------------------------ No results for input(s): TSH, T4TOTAL, T3FREE, THYROIDAB in the last 72 hours.  Invalid input(s): FREET3   Coagulation profile No results for input(s): INR, PROTIME in the last 168 hours. ------------------------------------------------------------------------------------------------------------------- No results for  input(s): DDIMER in the last 72 hours. -------------------------------------------------------------------------------------------------------------------  Cardiac Enzymes No results for input(s): CKMB, TROPONINI, MYOGLOBIN in the last 168 hours.  Invalid input(s): CK ------------------------------------------------------------------------------------------------------------------ Invalid input(s): POCBNP  ---------------------------------------------------------------------------------------------------------------  Urinalysis    Component Value Date/Time   COLORURINE YELLOW 06/23/2013 1750   APPEARANCEUR CLEAR 06/23/2013 1750   LABSPEC 1.010 06/23/2013 1750   PHURINE 6.0 06/23/2013 1750   GLUCOSEU 250* 06/23/2013 1750   HGBUR NEGATIVE 06/23/2013 1750   BILIRUBINUR NEGATIVE 06/23/2013 1750   KETONESUR NEGATIVE 06/23/2013 1750   PROTEINUR NEGATIVE 06/23/2013 1750   UROBILINOGEN 0.2 06/23/2013 1750   NITRITE NEGATIVE 06/23/2013 1750   LEUKOCYTESUR NEGATIVE 06/23/2013 1750     RADIOLOGY: No results found.  EKG: No orders found for this or any previous visit.  IMPRESSION AND PLAN:  patient is a 54 year old white female with diabetes nicotine addiction with bilateral lower extremity ulcers   1. Bilateral lower extremity ulcers: Left greater than right, at this time would continue Bactrim. There is a concern there is osteomyelitis involving the left foot therefore I will go ahead and get a MRI of her foot. She needs podiatry consult for further debridement and possible other intervention.  2. Diabetes type 2: Continue NovoLog sliding scale and Lantus  3. Hypertension continue HCTZ and lisinopril will adjust her medications as needed  4. Nicotine addiction: Smoking cessation done 4 minutes spent strongly recommended she stop smoking she is artery on the nicotin replacement   All the records are reviewed and case  discussed with ED provider. Management plans discussed with  the patient, family and they are in agreement.  CODE STATUS:    Code Status Orders        Start     Ordered   03/31/15 2245  Full code   Continuous     03/31/15 2253       TOTAL TIME TAKING CARE OF THIS PATIENT:  55 minutes.    Auburn Bilberry M.D on 04/01/2015 at 4:38 PM  Between 7am to 6pm - Pager - [phone]  After 6pm go to [email or web address] - password [email or web address] [facility]  [facility]  Office  [phone]  CC: Primary care physician; PROVIDER NOT IN SYSTEM

## 2015-04-01 NOTE — [facility] Group Notes (Signed)
[facility]   04/01/2015 3:41 PM  Participation Quality:  Minimal   Mood/Affect:  Flat  Depression Rating:  Unable to rate   Anxiety Rating:  Unable to rate   Thoughts of Suicide:  No Will you contract for safety?   NA  Current AVH:  No  Plan for Discharge/Comments:  Pt plans to return home and follow up with outpatient. Pt's goals for today was to be discharged. During group, patient was disorganized and difficult to follow.   Transportation Means: bus/ cab voucher   Supports: None   Tiyana Galla [employer] Tawn Fitzner MSW, [employer]CSWA

## 2015-04-01 NOTE — [facility] Group Notes (Signed)
[facility] Group Notes:  (Nursing/MHT/Case Management/Adjunct)  Date:  04/01/2015  Time:  11:43 AM  Type of Therapy:  Psychoeducational Skills  Participation Level:  Did Not Attend   Foy GuadalajaraJasmine R Millee Denise 04/01/2015, 11:43 AM

## 2015-04-01 NOTE — Consult Note (Signed)
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 Erythema and induration consistent with cellulitis to left lower extremity.  Wound type:Neuropathic ulcer, infectious Pressure Ulcer POA: Yes Measurement: 4 cm x 4 cmx 0.3cm plantar surface left foot.   Wound bed:50% yellow slough 50% ruddy red, bleeds with cleansing.  Drainage (amount, consistency, odor) Minimal serosanguinous drainage.  Musty odor.  Periwound:Erythema, warmth and induration to left lower extremity.  Callous present circumferentially to periwound Dressing procedure/placement/frequency:Cleanse ulcer to left plantar foot with NS and pat gently dry.  Apply Santyl enzymatic debrider to wound bed, 1/8 inch thickness (opaque).  Cover with NS moist 2x2.  Secure with 4x4 gauze, kerlix and tape.  Change daily.  Medical consult recommended.  Will not follow at this time.  Please re-consult if needed.  Maple HudsonKaren Camber Ninh RN BSN CWON Pager [phone]

## 2015-04-01 NOTE — Tx Team (Signed)
Initial Interdisciplinary Treatment Plan   PATIENT STRESSORS: Financial difficulties Health problems   PATIENT STRENGTHS: Capable of independent living   PROBLEM LIST: Problem List/Patient Goals Date to be addressed Date deferred Reason deferred Estimated date of resolution  psychosis 03/3015     2.5cm ulcer ball left foot 03/31/15                                                DISCHARGE CRITERIA:  Adequate post-discharge living arrangements Improved stabilization in mood, thinking, and/or behavior  PRELIMINARY DISCHARGE PLAN: Return to previous living arrangement  PATIENT/FAMIILY INVOLVEMENT: This treatment plan has been presented to and reviewed with the patient, [name], and/or family member.  The patient and family have been given the opportunity to ask questions and make suggestions.  [name] 04/01/2015, 12:08 AM

## 2015-04-01 NOTE — Progress Notes (Signed)
[place] is 54 [place] [place] with increase psychosis and complaints of left foot pain. Pt called EMS for leg pain then poured hot water on her legs, yelling "help me" per documentation. Dx. HTN, DM, Hypercholesterolemia. Pt present to the unit ambulatory. disorganized and paranoid. fidgety and restless. States I can't sit still. Speech gurgled/rumbled. disheveled and malodorous. tangential with FOI and loose association. C/o left foot pain. Pain scale 10/10. Pt has 2.5cm ulcer on the ball of her left foot. Per documentation, taking Septra DS twice a day for left foot cellulites. Unable to report when she last had her medications, poor historian.  States she has a Engineer, civil (consulting)nurse that come in daily. Pt left lower ext from knee to foot is +redness, +3 pitting edema and ulcer on ball of her feet. Left foot is worse than right. +pp. Cap refill <2-3 sec. Showered. Blood sugar  175. Pt brought in own medications and sent to pharmacy. Feedback provided to Dr Garnetta BuddyFaheem. N.O. initiate order set. Continue Lantus 18 units sq nightly, trazodone 100 mg q HS and ativan 1-2mg  PO q6 hours  PRN. Ativan 2mg  given for anxiety and Tylenol 650 mg given PO PRN for pain. Denies SI/HI. q15 min checks maintained for safety. Pt oriented to the unit, treatment agreement signed.  Will continue to monitor behavior.
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
## 2015-04-02 ENCOUNTER — Inpatient Hospital Stay: Payer: Medicaid Other

## 2015-04-02 LAB — GLUCOSE, CAPILLARY
GLUCOSE-CAPILLARY: 104 mg/dL — AB (ref 65–99)
GLUCOSE-CAPILLARY: 125 mg/dL — AB (ref 65–99)
Glucose-Capillary: 256 mg/dL — ABNORMAL HIGH (ref 65–99)
Glucose-Capillary: 123 mg/dL — ABNORMAL HIGH (ref 65–99)

## 2015-04-02 MED ORDER — SODIUM CHLORIDE 0.9 % IJ SOLN
INTRAMUSCULAR | Status: AC
Start: 2015-04-02 — End: 2015-04-02
  Administered 2015-04-02: 14:00:00
  Filled 2015-04-02: qty 6

## 2015-04-02 MED ORDER — GADOBENATE DIMEGLUMINE 529 MG/ML IV SOLN
15.0000 mL | Freq: Once | INTRAVENOUS | Status: AC | PRN
  Administered 2015-04-02: 14 mL via INTRAVENOUS

## 2015-04-02 MED ORDER — LISINOPRIL 5 MG PO TABS: 2.5000 mg | ORAL_TABLET | Freq: Every day | ORAL | Status: DC

## 2015-04-02 NOTE — Progress Notes (Signed)
Patient is alert and oriented x 3, however is unaware of why she is on a behavioral health unit. She states she is only in the hospital for care of her foot ulcers. She denies any mental health issues and states her mood is "fine." Her affect is cheerful. Denies SI or HI. There is no evidence of gross psychotic thinking. Speech/thoughts are circumstantial and tangential but patient able to be redirected back to point of conversation.  Patient has been visible on the unit and her behavior has been appropriate. She has been compliant with all nursing interventions. Able to tolerate MRI. Her appetite is good and she is taking adequate fluids. She was educated regarding her diet as some of her blood glucose finger sticks have been high, although will need reinforcement of teaching.  Will continue per current treatment plan. Monitor mood and behaviors and response to medications. Monitor wound healing.

## 2015-04-02 NOTE — Progress Notes (Signed)
Patient has been cooperative with unit routine. Dressing dry and intact to L foot. Cont redness to left lower extremity. No falls or injuries. Cont to use wheelchair for mobility. Will cont to monitor and support

## 2015-04-02 NOTE — [facility] Group Notes (Signed)
[facility] Group Notes:  (Nursing/MHT/Case Management/Adjunct)  Date:  04/02/2015  Time:  1:59 AM  Type of Therapy:  Group Therapy  Participation Level:  Did Not Attend   [name] 04/02/2015, 1:59 AM

## 2015-04-02 NOTE — Progress Notes (Signed)
[facility] MD Progress Note  04/02/2015 11:44 AM [name]  MRN:  [id number]  Subjective:  [name] reports no problems today. She slept well and feels rested. Appetite is okay. She denies symptoms of depression, anxiety, or psychosis. She believes that her thinking is clear. She is concerned about the ulcer on her foot. She uses a wheelchair to ambulate due to pain. She was seen by medicine consultant will reports ulcers on both feet. She will need a podiatry consult. MRI to rule out osteomyelitis has been ordered. I spoke with her son is the guardian. The family is very supportive.  Principal Problem: Bipolar I disorder, most recent episode (or current) manic Diagnosis:   Patient Active Problem List   Diagnosis Date Noted  . Bipolar I disorder, most recent episode (or current) manic [F31.10] 04/01/2015  . Unspecified constipation [K59.00] 06/26/2013  . Nausea and vomiting [R11.2] 06/25/2013  . Cellulitis and abscess of foot [L03.119, L02.619] 06/23/2013  . DM (diabetes mellitus) [E11.9] 06/23/2013  . HTN (hypertension) [I10] 06/23/2013  . Tobacco abuse [Z72.0] 06/23/2013  . Hyperlipidemia [E78.5] 06/23/2013   Total Time spent with patient: 20 minutes   Past Medical History:  Past Medical History  Diagnosis Date  . Diabetes mellitus without complication   . Hypertension   . Hypercholesterolemia    History reviewed. No pertinent past surgical history. Family History:  Family History  Problem Relation Age of Onset  . Asthma Mother   . Cancer Mother   . Hypertension Mother   . Hypertension Father   . Diabetes Father   . Stroke Brother    Social History:  History  Alcohol Use  . Yes    Comment: occasionally     History  Drug Use No    History   Social History  . Marital Status: Legally Separated    Spouse Name: N/A  . Number of Children: N/A  . Years of Education: N/A   Social History Main Topics  . Smoking status: Current Every Day Smoker -- 1.00 packs/day  for 34 years    Types: Cigarettes  . Smokeless tobacco: Not on file  . Alcohol Use: Yes     Comment: occasionally  . Drug Use: No  . Sexual Activity: Not Currently   Other Topics Concern  . None   Social History Narrative   Additional History:    Sleep: Good  Appetite:  Good   Assessment:   Musculoskeletal: Strength & Muscle Tone: within normal limits Gait & Station: normal Patient leans: N/A   Psychiatric Specialty Exam: Physical Exam  Nursing note and vitals reviewed.   Review of Systems  Musculoskeletal: Positive for joint pain.  All other systems reviewed and are negative.   Blood pressure 97/44, pulse 92, temperature 98.6 F (37 C), temperature source Oral, resp. rate 20, height  (1.549 m), weight 68.04 kg (150 lb), SpO2 97 %.Body mass index is 28.36 kg/(m^2).  General Appearance: Disheveled  Eye Contact::  Minimal  Speech:  Slow  Volume:  Normal  Mood:  Euthymic  Affect:  Appropriate  Thought Process:  Disorganized  Orientation:  Full (Time, Place, and Person)  Thought Content:  Delusions and Paranoid Ideation  Suicidal Thoughts:  No  Homicidal Thoughts:  No  Memory:  Immediate;   Fair Recent;   Fair Remote;   Fair  Judgement:  Fair  Insight:  Fair  Psychomotor Activity:  Normal  Concentration:  Fair  Recall:  [employer] of Knowledge:Fair  Language:  Fair  Akathisia:  No  Handed:  Right  AIMS (if indicated):     Assets:  Communication Skills Desire for Improvement Financial Resources/Insurance Housing Social Support  ADL's:  Intact  Cognition: WNL  Sleep:  Number of Hours: 7     Current Medications: Current Facility-Administered Medications  Medication Dose Route Frequency Provider Last Rate Last Dose  . acetaminophen (TYLENOL) tablet 650 mg  650 mg Oral Q6H PRN Brandy Hale, MD   650 mg at 03/31/15 2314  . alum & mag hydroxide-simeth (MAALOX/MYLANTA) 200-200-20 MG/5ML suspension 30 mL  30 mL Oral Q4H PRN Brandy Hale, MD   30 mL at  04/01/15 1013  . collagenase (SANTYL) ointment   Topical Daily Katti Pelle B Gautam Langhorst, MD      . divalproex (DEPAKOTE) DR tablet 500 mg  500 mg Oral Q12H Joniel Graumann B Jayce Boyko, MD   500 mg at 04/02/15 0833  . hydrochlorothiazide (MICROZIDE) capsule 12.5 mg  12.5 mg Oral Daily [place] Ndiaye B Stephano Arrants, MD   12.5 mg at 04/02/15 0834  . insulin aspart (novoLOG) injection 0-9 Units  0-9 Units Subcutaneous TID WC Auburn Bilberry, MD   0 Units at 04/01/15 1815  . insulin glargine (LANTUS) injection 18 Units  18 Units Subcutaneous QHS Brandy Hale, MD   18 Units at 04/01/15 2212  . [START ON 04/03/2015] lisinopril (PRINIVIL,ZESTRIL) tablet 2.5 mg  2.5 mg Oral Daily Srikar Sudini, MD      . LORazepam (ATIVAN) tablet 1-2 mg  1-2 mg Oral Q6H PRN Brandy Hale, MD   1 mg at 04/01/15 1357  . magnesium hydroxide (MILK OF MAGNESIA) suspension 30 mL  30 mL Oral Daily PRN Brandy Hale, MD      . nicotine (NICODERM CQ - dosed in mg/24 hours) patch 21 mg  21 mg Transdermal Daily Prestin Munch B Luzelena Heeg, MD   21 mg at 04/02/15 0835  . omega-3 acid ethyl esters (LOVAZA) capsule 1 g  1 g Oral BID Shari Prows, MD   1 g at 04/02/15 0834  . pravastatin (PRAVACHOL) tablet 40 mg  40 mg Oral q1800 Taimi Towe B Yuchen Fedor, MD   40 mg at 04/01/15 1814  . risperiDONE (RISPERDAL) tablet 2 mg  2 mg Oral BID Shari Prows, MD   2 mg at 04/02/15 0833  . sodium chloride 0.9 % injection           . sulfamethoxazole-trimethoprim (BACTRIM DS,SEPTRA DS) 800-160 MG per tablet 1 tablet  1 tablet Oral Q12H Shari Prows, MD   1 tablet at 04/02/15 0834  . traZODone (DESYREL) tablet 100 mg  100 mg Oral QHS Brandy Hale, MD   100 mg at 04/01/15 2205    Lab Results:  Results for orders placed or performed during the hospital encounter of 03/31/15 (from the past 48 hour(s))  Glucose, capillary     Status: Abnormal   Collection Time: 03/31/15  8:28 PM  Result Value Ref Range   Glucose-Capillary 175 (H) 65 - 99 mg/dL  Glucose, capillary      Status: Abnormal   Collection Time: 04/01/15  4:42 PM  Result Value Ref Range   Glucose-Capillary 130 (H) 65 - 99 mg/dL   Comment 1 Notify RN   Glucose, capillary     Status: Abnormal   Collection Time: 04/01/15  6:05 PM  Result Value Ref Range   Glucose-Capillary 187 (H) 65 - 99 mg/dL  Glucose, capillary     Status: Abnormal   Collection Time: 04/01/15  9:25  PM  Result Value Ref Range   Glucose-Capillary 144 (H) 65 - 99 mg/dL  Glucose, capillary     Status: Abnormal   Collection Time: 04/02/15  7:00 AM  Result Value Ref Range   Glucose-Capillary 104 (H) 65 - 99 mg/dL    Physical Findings: AIMS: Facial and Oral Movements Muscles of Facial Expression: None, normal Lips and Perioral Area: None, normal Jaw: None, normal Tongue: None, normal,Extremity Movements Upper (arms, wrists, hands, fingers): None, normal Lower (legs, knees, ankles, toes): None, normal, Trunk Movements Neck, shoulders, hips: None, normal, Overall Severity Severity of abnormal movements (highest score from questions above): None, normal Incapacitation due to abnormal movements: None, normal Patient's awareness of abnormal movements (rate only patient's report): No Awareness, Dental Status Current problems with teeth and/or dentures?: No Does patient usually wear dentures?: No  CIWA:  CIWA-Ar Total: 2 COWS:     Treatment Plan Summary: Daily contact with patient to assess and evaluate symptoms and progress in treatment and Medication management   Medical Decision Making:  New problem, with additional work up planned, Review of Psycho-Social Stressors (1), Review or order clinical lab tests (1), Independent Review of image, tracing or specimen (2), Review of Medication Regimen & Side Effects (2) and Review of New Medication or Change in Dosage (2)   [name] is a 54 year old female with remote history of bipolar disorder history who was admitted to the hospital for disorganized, sexualized, bizarre  behavior suggestive of bipolar mania.   1. Psychosis and mood. She was started on a combination of Depakote and Risperdal. VPA level in am.  2. Agitation. At the emergency room the patient was agitated and receive an injection of Haldol once. No unwanted behavior observed on the unit at present.  3. Diabetes. The patient and takes a combination of metformin and Lantus. Will continue with blood glucose monitoring.  4. Hypertension. She is on a combination of hydrochlorothiazide and lisinopril.  5. Dyslipidemia. Pravastatin 40 mg daily.  6. Social social. The patient is an incompetent adult.   7. Diabetic foot. The patient was [facility]. Wound and medicine consult are greatly appreciated. MRI results not available yet.  8. Disposition. She will be discharged to home. She will need follow-up appointment with a psychiatrist in the community.      Ardyn Forge 04/02/2015, 11:44 AM

## 2015-04-02 NOTE — [facility] Group Notes (Signed)
[facility] LCSW Group Therapy  04/02/2015 2:38 PM  Type of Therapy:  Group Therapy  Participation Level:  Did Not Attend  Summary of Progress/Problems:Patients were encouraged to define what community means to them. They were encouraged to identify formal and informal support within their community.    Sempra EnergyCandace L Harumi Yamin MSW, LCSWA 04/02/2015, 2:38 PM

## 2015-04-02 NOTE — [facility] Group Notes (Signed)
[facility] Group Notes:  (Nursing/MHT/Case Management/Adjunct)  Date:  04/02/2015  Time:  11:48 PM  Type of Therapy:  Group Therapy  Participation Level:  Active  Participation Quality:  Attentive  Affect:  Appropriate  Cognitive:  Appropriate  Insight:  Appropriate  Engagement in Group:  Engaged  Modes of Intervention:  n/a  Summary of Progress/Problems:  [name] [name] 04/02/2015, 11:48 PM

## 2015-04-02 NOTE — Progress Notes (Signed)
Pt visible on the unit. Using wheelchair to get about. States she is depressed over several thinkgs on her mind. Denies suicidal thoughts. Attended evening activities outside. Med compliant.

## 2015-04-02 NOTE — Plan of Care (Signed)
Problem: Alteration in thought process Goal: STG-Patient is able to discuss thoughts with staff Outcome: Progressing Pt able to verbalize feelings.

## 2015-04-02 NOTE — Progress Notes (Signed)
Wound care note: R foot approximately quarter size open ulcer, some granulating tissue with yellow edges. Minimal drainage. Foul odor, toe tender to touch.  L foot - open ulcer approximately dollar size, some granulating tissue with minimal drainage and yellow edges. Area swollen, with erythema up to the ankles. Very foul odor and pain to touch.  Dressing changed per MD order. Patient tolerated treatment without incident. Will continue to monitor wound healing.

## 2015-04-03 LAB — GLUCOSE, CAPILLARY
GLUCOSE-CAPILLARY: 112 mg/dL — AB (ref 65–99)
GLUCOSE-CAPILLARY: 114 mg/dL — AB (ref 65–99)
Glucose-Capillary: 152 mg/dL — ABNORMAL HIGH (ref 65–99)
Glucose-Capillary: 132 mg/dL — ABNORMAL HIGH (ref 65–99)

## 2015-04-03 LAB — VITAMIN B12: Vitamin B-12: 272 pg/mL (ref 180–914)

## 2015-04-03 LAB — COMPREHENSIVE METABOLIC PANEL
ALBUMIN: 3.3 g/dL — AB (ref 3.5–5.0)
ALT: 16 U/L (ref 14–54)
ANION GAP: 11 (ref 5–15)
AST: 17 U/L (ref 15–41)
Alkaline Phosphatase: 113 U/L (ref 38–126)
BILIRUBIN TOTAL: 0.2 mg/dL — AB (ref 0.3–1.2)
BUN: 18 mg/dL (ref 6–20)
CALCIUM: 8.7 mg/dL — AB (ref 8.9–10.3)
CO2: 26 mmol/L (ref 22–32)
Chloride: 93 mmol/L — ABNORMAL LOW (ref 101–111)
Creatinine, Ser: 1.03 mg/dL — ABNORMAL HIGH (ref 0.44–1.00)
GFR calc non Af Amer: >60 mL/min (ref >60)
Glucose, Bld: 99 mg/dL (ref 65–99)
POTASSIUM: 4.1 mmol/L (ref 3.5–5.1)
Sodium: 130 mmol/L — ABNORMAL LOW (ref 135–145)
Total Protein: 7.3 g/dL (ref 6.5–8.1)

## 2015-04-03 LAB — LIPID PANEL
Cholesterol: 114 mg/dL (ref 0–200)
HDL: 35 mg/dL — AB (ref >40)
LDL Cholesterol: 58 mg/dL (ref 0–99)
TRIGLYCERIDES: 105 mg/dL (ref <150)
Total CHOL/HDL Ratio: 3.3 RATIO
VLDL: 21 mg/dL (ref 0–40)

## 2015-04-03 LAB — HEMOGLOBIN A1C: Hgb A1c MFr Bld: 7.8 % — ABNORMAL HIGH (ref 4.0–6.0)

## 2015-04-03 LAB — FOLATE: FOLATE: 8.4 ng/mL (ref >5.9)

## 2015-04-03 LAB — VALPROIC ACID LEVEL: VALPROIC ACID LVL: 55 ug/mL (ref 50.0–100.0)

## 2015-04-03 NOTE — [facility] Group Notes (Signed)
[facility] Group Notes:  (Nursing/MHT/Case Management/Adjunct)  Date:  04/03/2015  Time:  11:43 PM  Type of Therapy:  Group Therapy  Participation Level:  Active  Participation Quality:  Appropriate  Affect:  Appropriate  Cognitive:  Appropriate  Insight:  Appropriate  Engagement in Group:  Engaged  Modes of Intervention:  Discussion  Summary of Progress/Problems:  [name] [name] 04/03/2015, 11:43 PM

## 2015-04-03 NOTE — [facility] Counselor (Signed)
Adult Comprehensive Assessment  Patient ID: [name], female   DOB: January 06, 1961, 54 y.o.   MRN: [id number]  Information Source: Information source: Patient  Current Stressors:  Educational / Learning stressors: Did not graduate high school.  Employment / Job issues: None reported.  Family Relationships: None reported  Financial / Lack of resources (include bankruptcy): Recieves SSDI Housing / Lack of housing: None reported  Physical health (include injuries & life threatening diseases): Diabetes  Social relationships: None reported  Substance abuse: Denies use  Living/Environment/Situation:  Living Arrangements: Alone Living conditions (as described by patient or Rivera): Apartment "I love it"  How long has patient lived in current situation?: recently moved What is atmosphere in current home: Comfortable  Family History:  Marital status: Divorced Divorced, when?: Pt not sure.  What types of issues is patient dealing with in the relationship?: cheating  Does patient have children?: Yes How many children?: 1 How is patient's relationship with their children?: Son- "he's a good boy"   Childhood History:  By whom was/is the patient raised?: Grandparents Description of patient's relationship with caregiver when they were a child: "They were great. They treated me like a baby"  Patient's description of current relationship with people who raised him/her: Both grandparents passed away  Does patient have siblings?: No Did patient suffer any verbal/emotional/physical/sexual abuse as a child?: No Did patient suffer from severe childhood neglect?: No Has patient ever been sexually abused/assaulted/raped as an adolescent or adult?: No Was the patient ever a victim of a crime or a disaster?: No Witnessed domestic violence?: No Has patient been effected by domestic violence as an adult?: No  Education:  Highest grade of school patient has completed: 7th grade Currently a  student?: No Learning disability?: No  Employment/Work Situation:   Employment situation: On disability Why is patient on disability: "I cant read"  How long has patient been on disability: 10 years  Patient's job has been impacted by current illness: No What is the longest time patient has a held a job?: 1 year  Where was the patient employed at that time?: [employer]  Has patient ever been in the [employer] and Companymilitary?: No  Financial Resources:   Does patient have a Lawyerrepresentative payee or Rivera?: Yes Name of representative payee or Rivera: Rivera- [name]   Alcohol/Substance Abuse:   What has been your use of drugs/alcohol within the last 12 months?: Denies use  If attempted suicide, did drugs/alcohol play a role in this?: No Alcohol/Substance Abuse Treatment Hx: Denies past history Has alcohol/substance abuse ever caused legal problems?: No  Social Support System:   Patient's Community Support System: Good Describe Community Support System: Family, neighbors  Type of faith/religion: Christianity  How does patient's faith help to cope with current illness?: Pt unable to answer   Leisure/Recreation:   Leisure and Hobbies: playing cards, bingo  Strengths/Needs:   What things does the patient do well?: Games  In what areas does patient struggle / problems for patient: pain in feet   Discharge Plan:   Does patient have access to transportation?: Yes Will patient be returning to same living situation after discharge?: Yes Currently receiving community mental health services: No If no, would patient like referral for services when discharged?: No Does patient have financial barriers related to discharge medications?: No  Summary/Recommendations:   [name] is a 54 year old female who [facility] to problems with feet. She is unsure why she was admitted to [facility]. She currently lives  alone in an apartment. She receives SSDI. Her son, [name]. He  thought she was delusional about her feet therefore he brought her to the hospital. She states she was hospitalized at [facility] during the 1980s. She states she has not had any psychiatric hospitalizations since. Pt denies SA. She does not have a current provider and does not want a referral. CSW assessing. Pt plans to return home to apartment. Recommendations include; crisis stabilization, medication management, therapeutic milieu, and encourage group attendance and participation.   [name] L Hudsen Fei. MSW, LCSWA  04/03/2015

## 2015-04-03 NOTE — Progress Notes (Signed)
[facility] MD Progress Note  04/03/2015 2:55 PM [name]  MRN:  [id number]  Subjective:  [name] reports no symptoms of depression, anxiety, or psychosis. She believes that her thinking is clear and wants to be discharged. She still complains of pain and swelling in her feet. This is likely to 2 diabetic ulcers on both feet for which she is taking him to buy a lot. Luckily MRI does not indicate osteomyelitis. The patient was consulted by medicine and the recommendations are greatly appreciated.The patient accepts medications and tolerates them well area she participates in groups. There are no behavioral problems. She does not know longer appears psychotic. Labs today show low sodium slightly from Depakote with Depakote level of 55, normal lipid profile, elevated hemoglobin and 1C7.8  Principal Problem: Bipolar I disorder, most recent episode (or current) manic Diagnosis:   Patient Active Problem List   Diagnosis Date Noted  . Bipolar I disorder, most recent episode (or current) manic [F31.10] 04/01/2015  . Unspecified constipation [K59.00] 06/26/2013  . Nausea and vomiting [R11.2] 06/25/2013  . Cellulitis and abscess of foot [L03.119, L02.619] 06/23/2013  . DM (diabetes mellitus) [E11.9] 06/23/2013  . HTN (hypertension) [I10] 06/23/2013  . Tobacco abuse [Z72.0] 06/23/2013  . Hyperlipidemia [E78.5] 06/23/2013   Total Time spent with patient: 20 minutes   Past Medical History:  Past Medical History  Diagnosis Date  . Diabetes mellitus without complication   . Hypertension   . Hypercholesterolemia    History reviewed. No pertinent past surgical history. Family History:  Family History  Problem Relation Age of Onset  . Asthma Mother   . Cancer Mother   . Hypertension Mother   . Hypertension Father   . Diabetes Father   . Stroke Brother    Social History:  History  Alcohol Use  . Yes    Comment: occasionally     History  Drug Use No    History   Social History  .  Marital Status: Legally Separated    Spouse Name: N/A  . Number of Children: N/A  . Years of Education: N/A   Social History Main Topics  . Smoking status: Current Every Day Smoker -- 1.00 packs/day for 34 years    Types: Cigarettes  . Smokeless tobacco: Not on file  . Alcohol Use: Yes     Comment: occasionally  . Drug Use: No  . Sexual Activity: Not Currently   Other Topics Concern  . None   Social History Narrative   Additional History:    Sleep: Good  Appetite:  Good   Assessment:   Musculoskeletal: Strength & Muscle Tone: within normal limits Gait & Station: normal Patient leans: N/A   Psychiatric Specialty Exam: Physical Exam  Nursing note and vitals reviewed.   Review of Systems  Musculoskeletal: Positive for joint pain.  All other systems reviewed and are negative.   Blood pressure 76/46, pulse 93, temperature 98.4 F (36.9 C), temperature source Oral, resp. rate 20, height 5' 1"  (1.549 m), weight 68.04 kg (150 lb), SpO2 97 %.Body mass index is 28.36 kg/(m^2).  General Appearance: Disheveled  Eye Contact::  Good  Speech:  Clear and Coherent  Volume:  Normal  Mood:  Euthymic  Affect:  Appropriate  Thought Process:  Goal Directed  Orientation:  Full (Time, Place, and Person)  Thought Content:  WDL  Suicidal Thoughts:  No  Homicidal Thoughts:  No  Memory:  Immediate;   Fair Recent;   Fair Remote;   Fair  Judgement:  Fair  Insight:  Fair  Psychomotor Activity:  Normal  Concentration:  Fair  Recall:  [employer] [place]  Language: Fair  Akathisia:  No  Handed:  Right  AIMS (if indicated):     Assets:  Communication Skills Desire for Improvement Financial Resources/Insurance Housing Social Support  ADL's:  Intact  Cognition: WNL  Sleep:  Number of Hours: 7.5     Current Medications: Current Facility-Administered Medications  Medication Dose Route Frequency Provider Last Rate Last Dose  . acetaminophen (TYLENOL) tablet 650 mg   650 mg Oral Q6H PRN Rainey Pines, MD   650 mg at 04/03/15 0909  . alum & mag hydroxide-simeth (MAALOX/MYLANTA) 200-200-20 MG/5ML suspension 30 mL  30 mL Oral Q4H PRN Rainey Pines, MD   30 mL at 04/01/15 1013  . collagenase (SANTYL) ointment   Topical Daily Blanchard Willhite B Annora Guderian, MD      . divalproex (DEPAKOTE) DR tablet 500 mg  500 mg Oral Q12H Faryal Marxen B Diontay Rosencrans, MD   500 mg at 04/03/15 0858  . insulin aspart (novoLOG) injection 0-9 Units  0-9 Units Subcutaneous TID WC Dustin Flock, MD   2 Units at 04/03/15 1206  . insulin glargine (LANTUS) injection 18 Units  18 Units Subcutaneous QHS Rainey Pines, MD   18 Units at 04/02/15 2121  . LORazepam (ATIVAN) tablet 1-2 mg  1-2 mg Oral Q6H PRN Rainey Pines, MD   1 mg at 04/03/15 0900  . magnesium hydroxide (MILK OF MAGNESIA) suspension 30 mL  30 mL Oral Daily PRN Rainey Pines, MD      . nicotine (NICODERM CQ - dosed in mg/24 hours) patch 21 mg  21 mg Transdermal Daily Adarryl Goldammer B Taeko Schaffer, MD   21 mg at 04/03/15 0857  . omega-3 acid ethyl esters (LOVAZA) capsule 1 g  1 g Oral BID Clovis Fredrickson, MD   1 g at 04/03/15 0858  . pravastatin (PRAVACHOL) tablet 40 mg  40 mg Oral q1800 Natale Thoma B Kamerin Axford, MD   40 mg at 04/02/15 1739  . risperiDONE (RISPERDAL) tablet 2 mg  2 mg Oral BID Clovis Fredrickson, MD   2 mg at 04/03/15 0859  . sulfamethoxazole-trimethoprim (BACTRIM DS,SEPTRA DS) 800-160 MG per tablet 1 tablet  1 tablet Oral Q12H Clovis Fredrickson, MD   1 tablet at 04/03/15 0901  . traZODone (DESYREL) tablet 100 mg  100 mg Oral QHS Rainey Pines, MD   100 mg at 04/02/15 2120    Lab Results:  Results for orders placed or performed during the hospital encounter of 03/31/15 (from the past 48 hour(s))  Glucose, capillary     Status: Abnormal   Collection Time: 04/01/15  4:42 PM  Result Value Ref Range   Glucose-Capillary 130 (H) 65 - 99 mg/dL   Comment 1 Notify RN   Glucose, capillary     Status: Abnormal   Collection Time: 04/01/15  6:05 PM   Result Value Ref Range   Glucose-Capillary 187 (H) 65 - 99 mg/dL  Glucose, capillary     Status: Abnormal   Collection Time: 04/01/15  9:25 PM  Result Value Ref Range   Glucose-Capillary 144 (H) 65 - 99 mg/dL  Glucose, capillary     Status: Abnormal   Collection Time: 04/02/15  7:00 AM  Result Value Ref Range   Glucose-Capillary 104 (H) 65 - 99 mg/dL  Glucose, capillary     Status: Abnormal   Collection Time: 04/02/15 11:50 AM  Result Value Ref  Range   Glucose-Capillary 256 (H) 65 - 99 mg/dL   Comment 1 Notify RN   Glucose, capillary     Status: Abnormal   Collection Time: 04/02/15  4:35 PM  Result Value Ref Range   Glucose-Capillary 123 (H) 65 - 99 mg/dL   Comment 1 Notify RN   Glucose, capillary     Status: Abnormal   Collection Time: 04/02/15  8:54 PM  Result Value Ref Range   Glucose-Capillary 125 (H) 65 - 99 mg/dL  Valproic acid level     Status: None   Collection Time: 04/03/15  5:44 AM  Result Value Ref Range   Valproic Acid Lvl 55 50.0 - 100.0 ug/mL  Vitamin B12     Status: None   Collection Time: 04/03/15  5:44 AM  Result Value Ref Range   Vitamin B-12 272 180 - 914 pg/mL    Comment: (NOTE) This assay is not validated for testing neonatal or myeloproliferative syndrome specimens for Vitamin B12 levels. Performed at [facility]   Lipid panel     Status: Abnormal   Collection Time: 04/03/15  5:44 AM  Result Value Ref Range   Cholesterol 114 0 - 200 mg/dL   Triglycerides 105 <150 mg/dL   HDL 35 (L) >40 mg/dL   Total CHOL/HDL Ratio 3.3 RATIO   VLDL 21 0 - 40 mg/dL   LDL Cholesterol 58 0 - 99 mg/dL    Comment:        Total Cholesterol/HDL:CHD Risk Coronary Heart Disease Risk Table                     Men   Women  1/2 Average Risk   3.4   3.3  Average Risk       5.0   4.4  2 X Average Risk   9.6   7.1  3 X Average Risk  23.4   11.0        Use the calculated Patient Ratio above and the CHD Risk Table to determine the patient's CHD Risk.         ATP III CLASSIFICATION (LDL):  <100     mg/dL   Optimal  100-129  mg/dL   Near or Above                    Optimal  130-159  mg/dL   Borderline  160-189  mg/dL   High  >190     mg/dL   Very High   Folate     Status: None   Collection Time: 04/03/15  5:44 AM  Result Value Ref Range   Folate 8.4 >5.9 ng/mL  Comprehensive metabolic panel     Status: Abnormal   Collection Time: 04/03/15  5:44 AM  Result Value Ref Range   Sodium 130 (L) 135 - 145 mmol/L   Potassium 4.1 3.5 - 5.1 mmol/L   Chloride 93 (L) 101 - 111 mmol/L   CO2 26 22 - 32 mmol/L   Glucose, Bld 99 65 - 99 mg/dL   BUN 18 6 - 20 mg/dL   Creatinine, Ser 1.03 (H) 0.44 - 1.00 mg/dL   Calcium 8.7 (L) 8.9 - 10.3 mg/dL   Total Protein 7.3 6.5 - 8.1 g/dL   Albumin 3.3 (L) 3.5 - 5.0 g/dL   AST 17 15 - 41 U/L   ALT 16 14 - 54 U/L   Alkaline Phosphatase 113 38 - 126 U/L   Total Bilirubin 0.2 (  L) 0.3 - 1.2 mg/dL   GFR calc non Af Amer >60 >60 mL/min   GFR calc Af Amer >60 >60 mL/min    Comment: (NOTE) The eGFR has been calculated using the CKD EPI equation. This calculation has not been validated in all clinical situations. eGFR's persistently <60 mL/min signify possible Chronic Kidney Disease.    Anion gap 11 5 - 15  Hemoglobin A1c     Status: Abnormal   Collection Time: 04/03/15  5:44 AM  Result Value Ref Range   Hgb A1c MFr Bld 7.8 (H) 4.0 - 6.0 %  Glucose, capillary     Status: Abnormal   Collection Time: 04/03/15  6:55 AM  Result Value Ref Range   Glucose-Capillary 112 (H) 65 - 99 mg/dL   Comment 1 Notify RN   Glucose, capillary     Status: Abnormal   Collection Time: 04/03/15 12:05 PM  Result Value Ref Range   Glucose-Capillary 152 (H) 65 - 99 mg/dL   Comment 1 Notify RN     Physical Findings: AIMS: Facial and Oral Movements Muscles of Facial Expression: None, normal Lips and Perioral Area: None, normal Jaw: None, normal Tongue: None, normal,Extremity Movements Upper (arms, wrists, hands, fingers):  None, normal Lower (legs, knees, ankles, toes): None, normal, Trunk Movements Neck, shoulders, hips: None, normal, Overall Severity Severity of abnormal movements (highest score from questions above): None, normal Incapacitation due to abnormal movements: None, normal Patient's awareness of abnormal movements (rate only patient's report): No Awareness, Dental Status Current problems with teeth and/or dentures?: No Does patient usually wear dentures?: No  CIWA:  CIWA-Ar Total: 2 COWS:     Treatment Plan Summary: Daily contact with patient to assess and evaluate symptoms and progress in treatment and Medication management   Medical Decision Making:  Established Problem, Stable/Improving (1), Review of Psycho-Social Stressors (1), Review or order clinical lab tests (1), Independent Review of image, tracing or specimen (2), Review of Medication Regimen & Side Effects (2) and Review of New Medication or Change in Dosage (2)   [name] is a 54 year old female with remote history of bipolar disorder history who was admitted to the hospital for disorganized, sexualized, bizarre behavior suggestive of bipolar mania.   1. Psychosis and mood. She was started on a combination of Depakote and Risperdal. VPA level 55.  2. Agitation. At the emergency room the patient was agitated and receive an injection of Haldol once. No unwanted behavior observed on the unit at present.  3. Diabetes. The patient and takes a combination of metformin and Lantus. Will continue with blood glucose monitoring.  4. Hypertension. She is on a combination of hydrochlorothiazide and lisinopril.  5. Dyslipidemia. Pravastatin 40 mg daily.  6. Social social. The patient is an incompetent adult.   7. Diabetic foot. The patient was started on Septra at [facility] emergency room. Wound and medicine consult are greatly appreciated. MRI results negative for osteomyelitis. Awaiting podiatry consult.not available yet.  8.  Disposition. She will be discharged to home. She will need follow-up appointment with a psychiatrist in the community.      Ammiel Guiney 04/03/2015, 2:55 PM

## 2015-04-03 NOTE — Progress Notes (Signed)
A call was made to pediatrist for consult.   I paged the on call number and also left a message with the answer machine.  No return calls were obtained.

## 2015-04-03 NOTE — [facility] Group Notes (Signed)
[facility] LCSW Group Therapy  04/03/2015 3:20 PM  Type of Therapy:  Group Therapy  Participation Level:  Did Not Attend  Modes of Intervention:  Discussion, Education, Role-play, Socialization and Support  Summary of Progress/Problems: Positive thoughts: Pt was encouraged to identify distorted thoughts. They will discuss the emotions and behaviors influenced by these thoughts. Pt will discuss ways to recognize distorted thoughts and how to refute them.   Mikaya Bunner L Youssef Footman MSW, LCSWA  04/03/2015, 3:20 PM

## 2015-04-03 NOTE — [facility] Counselor (Signed)
Authorization Request for Psych Inpt. Treatment from [employer] completed and submitted. TAR# [id number].

## 2015-04-03 NOTE — Progress Notes (Signed)
Patient awake and alert.  She has a flat affect. But her mood is brighter.  She is able to be redirectable and has been visible in dayroom.  Patient medication compliant .  Multiple attempts have been made for the pediatrist consult.  Cont to await verification of consult order. Until then pt's L foot was cleaned and dressed as per orders.  Pt's right great toe are was cleaned and dressed.   Will cont to monitor for safety.

## 2015-04-03 NOTE — Progress Notes (Signed)
Pt visible on the unit. Using wheelchair to get around. Interacts well with staff and peers. Denies suicidal thoughts. Hoping to go home mon or tues. Med compliant. Attended group.

## 2015-04-03 NOTE — [facility] Counselor (Signed)
[facility] call from [facility] about the TAR# [id number] and needing to change the start request date to 04/01/2015. See the attached picture of the information put into Provider Connect

## 2015-04-03 NOTE — Progress Notes (Signed)
Message left with Dr. Racheal Patchesroxler's answering service

## 2015-04-03 NOTE — Progress Notes (Signed)
Another attempt was made to page pediatry.  A message was left with the on call service.  [facility] Notified.

## 2015-04-03 NOTE — Progress Notes (Signed)
No osteomyelitis on MRI. Podiatry to see patient. Continue bactrim.

## 2015-04-03 NOTE — [facility] Counselor (Signed)
[employer] Enrollment completed and submitted. STR# [id number]

## 2015-04-04 LAB — GLUCOSE, CAPILLARY
GLUCOSE-CAPILLARY: 84 mg/dL (ref 65–99)
GLUCOSE-CAPILLARY: 109 mg/dL — AB (ref 65–99)
GLUCOSE-CAPILLARY: 167 mg/dL — AB (ref 65–99)
Glucose-Capillary: 198 mg/dL — ABNORMAL HIGH (ref 65–99)

## 2015-04-04 MED ORDER — CYANOCOBALAMIN 1000 MCG/ML IJ SOLN
1000.0000 ug | Freq: Every day | INTRAMUSCULAR | Status: AC
  Administered 2015-04-04 – 2015-04-05 (×2): 1000 ug via INTRAMUSCULAR
  Filled 2015-04-04 (×2): qty 1

## 2015-04-04 MED ORDER — VITAMIN B-12 1000 MCG PO TABS: 1000.0000 ug | ORAL_TABLET | Freq: Every day | ORAL | Status: DC

## 2015-04-04 MED ORDER — PRAVASTATIN SODIUM 10 MG PO TABS
40.0000 mg | ORAL_TABLET | Freq: Every day | ORAL | Status: DC
  Administered 2015-04-04: 40 mg via ORAL
  Filled 2015-04-04: qty 4

## 2015-04-04 NOTE — [facility] Group Notes (Signed)
[facility] Group Notes:  (Nursing/MHT/Case Management/Adjunct)  Date:  04/04/2015  Time:  10:08 PM  Type of Therapy:  Psychoeducational Skills  Participation Level:  Active  Participation Quality:  Drowsy and Sharing  Affect:  Blunted and Flat  Cognitive:  Oriented  Insight:  Improving  Engagement in Group:  Lacking  Modes of Intervention:  Discussion and Exploration  Summary of Progress/Problems:  Foy GuadalajaraJasmine R Marshelle Bilger 04/04/2015, 10:08 PM

## 2015-04-04 NOTE — [facility] Group Notes (Signed)
[facility] LCSW Group Therapy  04/04/2015 1:51 PM  Type of Therapy:  Group Therapy  Participation Level:  Active  Participation Quality:  Appropriate and Attentive  Affect:  Appropriate  Cognitive:  Alert, Appropriate and Oriented  Insight:  Engaged  Engagement in Therapy:  Engaged  Modes of Intervention:  Socialization and Support  Summary of Progress/Problems: Patient attended and participated in group discussion appropriately. Patient shared that her best Independence Day Celebration memory is "cookouts, swimming and fishing".   Beryl MeagerIngle, Kairos Panetta T MSW, LCSWA 04/04/2015, 1:51 PM

## 2015-04-04 NOTE — Consult Note (Signed)
ORTHOPAEDIC CONSULTATION  REQUESTING PHYSICIAN: Shari ProwsJolanta B Pucilowska, MD  Chief Complaint: Bilateral foot ulcerations  HPI: [name] is a 54 y.o. female who complains of  bilateral foot ulcerations. History of diabetes. Admitted from the ED with swelling of her left foot. She is not sure how long the ulcerations have been present. She has not seen a podiatrist.  Past Medical History  Diagnosis Date  . Diabetes mellitus without complication   . Hypertension   . Hypercholesterolemia    History reviewed. No pertinent past surgical history. History   Social History  . Marital Status: Legally Separated    Spouse Name: N/A  . Number of Children: N/A  . Years of Education: N/A   Social History Main Topics  . Smoking status: Current Every Day Smoker -- 1.00 packs/day for 34 years    Types: Cigarettes  . Smokeless tobacco: Not on file  . Alcohol Use: Yes     Comment: occasionally  . Drug Use: No  . Sexual Activity: Not Currently   Other Topics Concern  . None   Social History Narrative   Family History  Problem Relation Age of Onset  . Asthma Mother   . Cancer Mother   . Hypertension Mother   . Hypertension Father   . Diabetes Father   . Stroke Brother    No Known Allergies Prior to Admission medications   Medication Sig Start Date End Date Taking? Authorizing Provider  acetaminophen (TYLENOL) 500 MG tablet Take 500 mg by mouth every 6 (six) hours as needed for pain.   Yes Historical Provider, MD  insulin glargine (LANTUS) 100 UNIT/ML injection Inject 15 Units into the skin at bedtime.   Yes Historical Provider, MD  lisinopril-hydrochlorothiazide (PRINZIDE,ZESTORETIC) 20-12.5 MG per tablet Take 1 tablet by mouth daily.   Yes Historical Provider, MD  pravastatin (PRAVACHOL) 40 MG tablet Take 40 mg by mouth every evening.   Yes Historical Provider, MD  aspirin 325 MG tablet Take 325 mg by mouth daily.     Historical Provider, MD  glipiZIDE-metformin (METAGLIP)  5-500 MG per tablet Take 1 tablet by mouth 2 (two) times daily before a meal. TAKES ONE AT LUNCH AND [facility]    Historical Provider, MD  Omega-3 Fatty Acids (FISH OIL PO) Take 1 capsule by mouth daily.    Historical Provider, MD  sulfamethoxazole-trimethoprim (BACTRIM DS,SEPTRA DS) 800-160 MG per tablet Take 1 tablet by mouth 2 (two) times daily. Patient not taking: Reported on 03/31/2015 03/28/15 04/04/15  Donnetta HutchingBrian Cook, MD   No results found.  Positive ROS: All other systems have been reviewed and were otherwise negative with the exception of those mentioned in the HPI and as above.  12 point ROS was performed. Patient does have some behavioral health issues. Not a provide substantial review of systems  Physical Exam: General: Alert and oriented.   No apparent distress.  Vascular:  Left foot:Dorsalis Pedis:  present Posterior Tibial:  present  Right foot: Dorsalis Pedis:  present Posterior Tibial:  present  Neuro: She is neuropathic to the lower extremities  Derm: On the plantar aspect of the left forefoot there is a full-thickness ulceration to fibrotic tissue. Signs of purulent drainage. The ulcer measures approximately 4 cm in diameter. It is plantar to the second and third metatarsophalangeal joints. A purulent drainage noted. She does have mild diffuse cellulitis to the dorsal forefoot. No lymphangitic streaking.  Right foot has a small superficial ulceration to the plantar aspect of the  interphalangeal joint of the right great toe. No signs of overt infection.  Ortho/MS: Edema to the left foot is noted compared to the right foot. A with range of motion.   MRI was reviewed and is negative for osteomyelitis.  Assessment: Diabetic foot ulceration bilateral.  Plan: She is currently receiving wound care with Santyl ointment. She needs to maintain nonweightbearing to the forefoot on the left foot. She can weight-bear on the right foot. She needs to have close follow-up with  podiatry or wound care on discharge. She may need formal debridement. She states she will follow up with someone in her hometown upon discharge. Does have a Chief Strategy Officer that sees her on a daily basis. I will be glad to see her upon discharge as well.    Irean Hong, DPM Cell [phone]   04/04/2015 5:21 PM

## 2015-04-04 NOTE — Progress Notes (Signed)
Patient awake and alert. affect. brighter. She is able to be redirectable and has been visible in dayroom. Patient medication compliant . Multiple attempts have been made for the pediatrist consult. Cont to await verification of consult order. Until then pt's L foot was cleaned and dressed as per orders. Pt's right great toe are was cleaned and dressed. Will cont to monitor for safety.

## 2015-04-04 NOTE — Consult Note (Signed)
Received consult and will evaluate this afternoon. Reviewed MRI, negative for osteomyelitis.

## 2015-04-04 NOTE — Progress Notes (Signed)
[facility] MD Progress Note  04/04/2015 1:16 PM [name]  MRN:  [id number]  Subjective:  [name] reports further improvement. She denies any symptoms of depression, anxiety, or psychosis. She believes that her thinking is clear. There were no bizarre or inappropriate behaviors on the unit. She accepts medications and tolerates them well. She is very pleased that there is no on infection in her feet. She already feels better with him to buy otic treatment She is awaiting a podiatry consult. Sleep and appetite are okay. She participated in groups. Social worker will contact her son who is the guardian to discuss discharge planning.  Principal Problem: Bipolar I disorder, most recent episode (or current) manic Diagnosis:   Patient Active Problem List   Diagnosis Date Noted  . Bipolar I disorder, most recent episode (or current) manic [F31.10] 04/01/2015  . Unspecified constipation [K59.00] 06/26/2013  . Nausea and vomiting [R11.2] 06/25/2013  . Cellulitis and abscess of foot [L03.119, L02.619] 06/23/2013  . DM (diabetes mellitus) [E11.9] 06/23/2013  . HTN (hypertension) [I10] 06/23/2013  . Tobacco abuse [Z72.0] 06/23/2013  . Hyperlipidemia [E78.5] 06/23/2013   Total Time spent with patient: 20 minutes   Past Medical History:  Past Medical History  Diagnosis Date  . Diabetes mellitus without complication   . Hypertension   . Hypercholesterolemia    History reviewed. No pertinent past surgical history. Family History:  Family History  Problem Relation Age of Onset  . Asthma Mother   . Cancer Mother   . Hypertension Mother   . Hypertension Father   . Diabetes Father   . Stroke Brother    Social History:  History  Alcohol Use  . Yes    Comment: occasionally     History  Drug Use No    History   Social History  . Marital Status: Legally Separated    Spouse Name: N/A  . Number of Children: N/A  . Years of Education: N/A   Social History Main Topics  . Smoking  status: Current Every Day Smoker -- 1.00 packs/day for 34 years    Types: Cigarettes  . Smokeless tobacco: Not on file  . Alcohol Use: Yes     Comment: occasionally  . Drug Use: No  . Sexual Activity: Not Currently   Other Topics Concern  . None   Social History Narrative   Additional History:    Sleep: Good  Appetite:  Good   Assessment:   Musculoskeletal: Strength & Muscle Tone: within normal limits Gait & Station: normal Patient leans: N/A   Psychiatric Specialty Exam: Physical Exam  Nursing note and vitals reviewed.   Review of Systems  Musculoskeletal: Positive for joint pain.  All other systems reviewed and are negative.   Blood pressure 119/68, pulse 99, temperature 98.4 F (36.9 C), temperature source Oral, resp. rate 20, height 5' 1" (1.549 m), weight 68.04 kg (150 lb), SpO2 97 %.Body mass index is 28.36 kg/(m^2).  General Appearance: Casual  Eye Contact::  Good  Speech:  Clear and Coherent  Volume:  Normal  Mood:  Euthymic  Affect:  Appropriate  Thought Process:  Goal Directed  Orientation:  Full (Time, Place, and Person)  Thought Content:  WDL  Suicidal Thoughts:  No  Homicidal Thoughts:  No  Memory:  Immediate;   Fair Recent;   Fair Remote;   Fair  Judgement:  Fair  Insight:  Fair  Psychomotor Activity:  Normal  Concentration:  Fair  Recall:  [employer] of  Knowledge:Fair  Language: Fair  Akathisia:  No  Handed:  Right  AIMS (if indicated):     Assets:  Communication Skills Desire for Improvement Financial Resources/Insurance Housing Social Support  ADL's:  Intact  Cognition: WNL  Sleep:  Number of Hours: 6.75     Current Medications: Current Facility-Administered Medications  Medication Dose Route Frequency Provider Last Rate Last Dose  . acetaminophen (TYLENOL) tablet 650 mg  650 mg Oral Q6H PRN Rainey Pines, MD   650 mg at 04/03/15 2147  . alum & mag hydroxide-simeth (MAALOX/MYLANTA) 200-200-20 MG/5ML suspension 30 mL  30 mL  Oral Q4H PRN Rainey Pines, MD   30 mL at 04/01/15 1013  . collagenase (SANTYL) ointment   Topical Daily  B , MD      . divalproex (DEPAKOTE) DR tablet 500 mg  500 mg Oral Q12H  B , MD   500 mg at 04/04/15 1017  . insulin aspart (novoLOG) injection 0-9 Units  0-9 Units Subcutaneous TID WC Dustin Flock, MD   2 Units at 04/04/15 1224  . insulin glargine (LANTUS) injection 18 Units  18 Units Subcutaneous QHS Rainey Pines, MD   18 Units at 04/03/15 2127  . LORazepam (ATIVAN) tablet 1-2 mg  1-2 mg Oral Q6H PRN Rainey Pines, MD   1 mg at 04/03/15 0900  . magnesium hydroxide (MILK OF MAGNESIA) suspension 30 mL  30 mL Oral Daily PRN Rainey Pines, MD      . nicotine (NICODERM CQ - dosed in mg/24 hours) patch 21 mg  21 mg Transdermal Daily  B , MD   21 mg at 04/04/15 1021  . omega-3 acid ethyl esters (LOVAZA) capsule 1 g  1 g Oral BID  B , MD   1 g at 04/04/15 1022  . pravastatin (PRAVACHOL) tablet 40 mg  40 mg Oral Q2200  B , MD      . risperiDONE (RISPERDAL) tablet 2 mg  2 mg Oral BID  B , MD   2 mg at 04/04/15 1017  . sulfamethoxazole-trimethoprim (BACTRIM DS,SEPTRA DS) 800-160 MG per tablet 1 tablet  1 tablet Oral Q12H  B , MD   1 tablet at 04/04/15 1017  . traZODone (DESYREL) tablet 100 mg  100 mg Oral QHS Rainey Pines, MD   100 mg at 04/03/15 2127    Lab Results:  Results for orders placed or performed during the hospital encounter of 03/31/15 (from the past 48 hour(s))  Glucose, capillary     Status: Abnormal   Collection Time: 04/02/15  4:35 PM  Result Value Ref Range   Glucose-Capillary 123 (H) 65 - 99 mg/dL   Comment 1 Notify RN   Glucose, capillary     Status: Abnormal   Collection Time: 04/02/15  8:54 PM  Result Value Ref Range   Glucose-Capillary 125 (H) 65 - 99 mg/dL  Valproic acid level     Status: None   Collection Time: 04/03/15  5:44 AM  Result Value Ref Range    Valproic Acid Lvl 55 50.0 - 100.0 ug/mL  Vitamin B12     Status: None   Collection Time: 04/03/15  5:44 AM  Result Value Ref Range   Vitamin B-12 272 180 - 914 pg/mL    Comment: (NOTE) This assay is not validated for testing neonatal or myeloproliferative syndrome specimens for Vitamin B12 levels. Performed at [facility]   Lipid panel     Status: Abnormal   Collection Time: 04/03/15  5:44 AM  Result Value Ref Range   Cholesterol 114 0 - 200 mg/dL   Triglycerides 105 <150 mg/dL   HDL 35 (L) >40 mg/dL   Total CHOL/HDL Ratio 3.3 RATIO   VLDL 21 0 - 40 mg/dL   LDL Cholesterol 58 0 - 99 mg/dL    Comment:        Total Cholesterol/HDL:CHD Risk Coronary Heart Disease Risk Table                     Men   Women  1/2 Average Risk   3.4   3.3  Average Risk       5.0   4.4  2 X Average Risk   9.6   7.1  3 X Average Risk  23.4   11.0        Use the calculated Patient Ratio above and the CHD Risk Table to determine the patient's CHD Risk.        ATP III CLASSIFICATION (LDL):  <100     mg/dL   Optimal  100-129  mg/dL   Near or Above                    Optimal  130-159  mg/dL   Borderline  160-189  mg/dL   High  >190     mg/dL   Very High   Folate     Status: None   Collection Time: 04/03/15  5:44 AM  Result Value Ref Range   Folate 8.4 >5.9 ng/mL  Comprehensive metabolic panel     Status: Abnormal   Collection Time: 04/03/15  5:44 AM  Result Value Ref Range   Sodium 130 (L) 135 - 145 mmol/L   Potassium 4.1 3.5 - 5.1 mmol/L   Chloride 93 (L) 101 - 111 mmol/L   CO2 26 22 - 32 mmol/L   Glucose, Bld 99 65 - 99 mg/dL   BUN 18 6 - 20 mg/dL   Creatinine, Ser 1.03 (H) 0.44 - 1.00 mg/dL   Calcium 8.7 (L) 8.9 - 10.3 mg/dL   Total Protein 7.3 6.5 - 8.1 g/dL   Albumin 3.3 (L) 3.5 - 5.0 g/dL   AST 17 15 - 41 U/L   ALT 16 14 - 54 U/L   Alkaline Phosphatase 113 38 - 126 U/L   Total Bilirubin 0.2 (L) 0.3 - 1.2 mg/dL   GFR calc non Af Amer >60 >60 mL/min   GFR calc Af Amer >60  >60 mL/min    Comment: (NOTE) The eGFR has been calculated using the CKD EPI equation. This calculation has not been validated in all clinical situations. eGFR's persistently <60 mL/min signify possible Chronic Kidney Disease.    Anion gap 11 5 - 15  Hemoglobin A1c     Status: Abnormal   Collection Time: 04/03/15  5:44 AM  Result Value Ref Range   Hgb A1c MFr Bld 7.8 (H) 4.0 - 6.0 %  Glucose, capillary     Status: Abnormal   Collection Time: 04/03/15  6:55 AM  Result Value Ref Range   Glucose-Capillary 112 (H) 65 - 99 mg/dL   Comment 1 Notify RN   Glucose, capillary     Status: Abnormal   Collection Time: 04/03/15 12:05 PM  Result Value Ref Range   Glucose-Capillary 152 (H) 65 - 99 mg/dL   Comment 1 Notify RN   Glucose, capillary     Status: Abnormal   Collection Time: 04/03/15  4:30 PM  Result  Value Ref Range   Glucose-Capillary 114 (H) 65 - 99 mg/dL  Glucose, capillary     Status: Abnormal   Collection Time: 04/03/15  8:25 PM  Result Value Ref Range   Glucose-Capillary 132 (H) 65 - 99 mg/dL   Comment 1 Notify RN   Glucose, capillary     Status: None   Collection Time: 04/04/15  6:57 AM  Result Value Ref Range   Glucose-Capillary 84 65 - 99 mg/dL  Glucose, capillary     Status: Abnormal   Collection Time: 04/04/15 11:49 AM  Result Value Ref Range   Glucose-Capillary 198 (H) 65 - 99 mg/dL   Comment 1 Notify RN     Physical Findings: AIMS: Facial and Oral Movements Muscles of Facial Expression: None, normal Lips and Perioral Area: None, normal Jaw: None, normal Tongue: None, normal,Extremity Movements Upper (arms, wrists, hands, fingers): None, normal Lower (legs, knees, ankles, toes): None, normal, Trunk Movements Neck, shoulders, hips: None, normal, Overall Severity Severity of abnormal movements (highest score from questions above): None, normal Incapacitation due to abnormal movements: None, normal Patient's awareness of abnormal movements (rate only patient's  report): No Awareness, Dental Status Current problems with teeth and/or dentures?: No Does patient usually wear dentures?: No  CIWA:  CIWA-Ar Total: 2 COWS:     Treatment Plan Summary: Daily contact with patient to assess and evaluate symptoms and progress in treatment and Medication management   Medical Decision Making:  Established Problem, Stable/Improving (1), Review of Psycho-Social Stressors (1), Review or order clinical lab tests (1), Independent Review of image, tracing or specimen (2), Review of Medication Regimen & Side Effects (2) and Review of New Medication or Change in Dosage (2)   Ms. Ortlieb is a 54 year old female with remote history of bipolar disorder history who was admitted to the hospital for disorganized, sexualized, bizarre behavior suggestive of bipolar mania.   1. Psychosis and mood. She was started on a combination of Depakote and Risperdal. VPA level 55.  2. Agitation. At the emergency room the patient was agitated and receive an injection of Haldol once. No unwanted behavior observed on the unit at present.  3. Diabetes. The patient and takes metformin and Lantus. Will continue with blood glucose monitoring.  4. Hypertension. She is on a combination of hydrochlorothiazide and lisinopril.  5. Dyslipidemia. Pravastatin 40 mg daily.  6. Social social. The patient is an incompetent adult.   7. Diabetic foot. The patient was started on Septra at [facility] emergency room. Wound, medicine and podiatry input are greatly appreciated. MRI results negative for osteomyelitis. Marland Kitchen  8. Metabolic syndrome. Lipid profile WNL, HgbA1C 7.8.   9. Low B12. Will give B12 injections for 2 days followed by oral.   10. Disposition. She will be discharged to home. She will need follow-up appointment with a psychiatrist in the community.            04/04/2015, 1:16 PM

## 2015-04-04 NOTE — Clinical Social Work Note (Signed)
CSW spoke with patient's son [name] [phone] who is patient's payee and will be able to pick patient up in the morning at discharge but is not able to today as patient's son is having car trouble. [facility] [place].

## 2015-04-05 LAB — GLUCOSE, CAPILLARY
GLUCOSE-CAPILLARY: 100 mg/dL — AB (ref 65–99)
GLUCOSE-CAPILLARY: 242 mg/dL — AB (ref 65–99)

## 2015-04-05 MED ORDER — SULFAMETHOXAZOLE-TRIMETHOPRIM 800-160 MG PO TABS: 1.0000 | ORAL_TABLET | Freq: Two times a day (BID) | ORAL | Status: AC

## 2015-04-05 MED ORDER — CYANOCOBALAMIN 1000 MCG PO TABS: 1000.0000 ug | ORAL_TABLET | Freq: Every day | ORAL | Status: DC

## 2015-04-05 MED ORDER — DIVALPROEX SODIUM 500 MG PO DR TAB: 500.0000 mg | DELAYED_RELEASE_TABLET | Freq: Two times a day (BID) | ORAL | Status: DC

## 2015-04-05 MED ORDER — INSULIN GLARGINE 100 UNIT/ML ~~LOC~~ SOLN: 18.0000 [IU] | Freq: Every day | SUBCUTANEOUS | Status: DC

## 2015-04-05 MED ORDER — RISPERIDONE 2 MG PO TABS: 2.0000 mg | ORAL_TABLET | Freq: Two times a day (BID) | ORAL | Status: DC

## 2015-04-05 MED ORDER — COLLAGENASE 250 UNIT/GM EX OINT: TOPICAL_OINTMENT | Freq: Every day | CUTANEOUS | Status: DC

## 2015-04-05 MED ORDER — INSULIN ASPART 100 UNIT/ML ~~LOC~~ SOLN: 0.0000 [IU] | Freq: Three times a day (TID) | SUBCUTANEOUS | Status: DC

## 2015-04-05 MED ORDER — TRAZODONE HCL 100 MG PO TABS: 100.0000 mg | ORAL_TABLET | Freq: Every day | ORAL | Status: DC

## 2015-04-05 NOTE — Plan of Care (Signed)
Problem: Alteration in thought process Goal: LTG-Patient behavior demonstrates decreased signs psychosis (Patient behavior demonstrates decreased signs of psychosis to the point the patient is safe to return home and continue treatment in an outpatient setting.)  Outcome: Progressing Patient is alert and oriented x 4. Not responding to internal stimuli, responding appropriately to situation.
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
## 2015-04-05 NOTE — [facility] Group Notes (Signed)
[facility] Group Notes:  (Nursing/MHT/Case Management/Adjunct)  Date:  04/05/2015  Time:  12:03 PM  Type of Therapy:  Psychoeducational Skills  Participation Level:  Active  Participation Quality:  Appropriate  Affect:  Appropriate  Cognitive:  Appropriate  Insight:  Good  Engagement in Group:  Engaged and Improving  Modes of Intervention:  Discussion and Education  Summary of Progress/Problems:  [name] M [name] 04/05/2015, 12:03 PM

## 2015-04-05 NOTE — Progress Notes (Signed)
AVS H&P Discharge Summary [facility] hospital follow-up

## 2015-04-05 NOTE — Progress Notes (Signed)
  [facility] Adult Case Management Discharge Plan :  Will you be returning to the same living situation after discharge:  Yes,  home alone with family support At [facility]discharge, do you have transportation home?: Yes,  patient's son Lyndal PulleyWayne Denny [phone] will pick up at discharge Do you have the ability to pay for your medications: Yes,  patient has Medicaid  Release of information consent forms completed and in the chart;  Patient's signature needed at discharge.  Patient to Follow up at: Follow-up Information    [facility] Go in 1 day.   Why:  For follow-up care; hospital followup appt at 10:00am on Wednesday 04/06/15   Contact information:   [address]  [place], [place] Ph [phone] Fax [phone]      Patient denies SI/HI: Yes,  patient denies SI/HI    Safety Planning and Suicide Prevention discussed: Yes,  SPE discussed with patient and her son  Have you used any form of tobacco in the last 30 days? (Cigarettes, Smokeless Tobacco, Cigars, and/or Pipes): Yes  Has patient been referred to the Quitline?: Patient refused referral  Lulu Ridingngle, Talibah Colasurdo T, MSW, LCSWA 04/05/2015, 10:09 AM

## 2015-04-05 NOTE — Progress Notes (Signed)
Patient cooperative and pleasant. Oriented x 4. Despite having applesauce, the patient had a difficult time swallowing her medications, and later vomited. She notes she did not feel well after med pass, but did feel complete relief after vomiting. She notes no needs or concern, in fact stating her gratefulness for her family who came to visit her and her home health nurse who helps her daily. She also stating feeling grateful for her foot being treatable, vs having to have her foot amputated, which was her fear. She denies SI, HI, and AVH.

## 2015-04-05 NOTE — Progress Notes (Signed)
Pt discharged home. DC instructions provided and explained. Medications reviewed. Rx given. All questions answered. Pt instructed to follow up with Prim[facility] care doctor and home health agency that has been following her foot ulcer and dressing change. Pt stable at discharge.

## 2015-04-05 NOTE — [facility] Suicide Risk Assessment (Signed)
[facility] INPATIENT:  Family/Significant Other Suicide Prevention Education  Suicide Prevention Education:  Education Completed; Lyndal PulleyWayne Denny (son) [phone] has been identified by the patient as the family member/significant other with whom the patient will be residing, and identified as the person(s) who will aid the patient in the event of a mental health crisis (suicidal ideations/suicide attempt).  With written consent from the patient, the family member/significant other has been provided the following suicide prevention education, prior to the and/or following the discharge of the patient.  The suicide prevention education provided includes the following:  Suicide risk factors  Suicide prevention and interventions  National Suicide Hotline telephone number  [facility] assessment telephone number  [facility] Emergency Assistance 911  [facility] and/or Residential Mobile Crisis Unit telephone number  Request made of family/significant other to:  Remove weapons (e.g., guns, rifles, knives), all items previously/currently identified as safety concern.    Remove drugs/medications (over-the-counter, prescriptions, illicit drugs), all items previously/currently identified as a safety concern.  The family member/significant other verbalizes understanding of the suicide prevention education information provided.  The family member/significant other agrees to remove the items of safety concern listed above.  Lulu RidingIngle, Ilia Dimaano T, MSW, LCSWA 04/05/2015, 10:03 AM

## 2015-04-05 NOTE — [facility] Suicide Risk Assessment (Signed)
[facility]   Demographic Factors:  Caucasian and Living alone  Total Time spent with patient: 30 minutes  Musculoskeletal: Strength & Muscle Tone: within normal limits Gait & Station: normal Patient leans: N/A  Psychiatric Specialty Exam: Physical Exam  Nursing note and vitals reviewed.   Review of Systems  Musculoskeletal: Positive for joint pain.  All other systems reviewed and are negative.   Blood pressure 97/61, pulse 96, temperature 98.4 F (36.9 C), temperature source Oral, resp. rate 20, height 5\' 1"  (1.549 m), weight 68.04 kg (150 lb), SpO2 97 %.Body mass index is 28.36 kg/(m^2).  General Appearance: Casual  Eye Contact::  Good  Speech:  Clear and Coherent409  Volume:  Normal  Mood:  Euthymic  Affect:  Appropriate  Thought Process:  Goal Directed  Orientation:  Full (Time, Place, and Person)  Thought Content:  WDL  Suicidal Thoughts:  No  Homicidal Thoughts:  No  Memory:  Immediate;   Fair Recent;   Fair Remote;   Fair  Judgement:  Fair  Insight:  Fair  Psychomotor Activity:  Normal  Concentration:  Fair  Recall:  [employer]Fair  Fund of Knowledge:Fair  Language: Fair  Akathisia:  No  Handed:  Right  AIMS (if indicated):     Assets:  Communication Skills Desire for Improvement Financial Resources/Insurance Housing Social Support  Sleep:  Number of Hours: 6.25  Cognition: WNL  ADL's:  Intact   Have you used any form of tobacco in the last 30 days? (Cigarettes, Smokeless Tobacco, Cigars, and/or Pipes): Yes  Has this patient used any form of tobacco in the last 30 days? (Cigarettes, Smokeless Tobacco, Cigars, and/or Pipes) Yes, A prescription for an FDA-approved tobacco cessation medication was offered at discharge and the patient refused  Mental Status Per Nursing Assessment::   On Admission:  NA  Current Mental Status by Physician: NA  Loss Factors: Decline in physical health  Historical Factors: NA  Risk Reduction  Factors:   Sense of responsibility to family, Positive social support and Positive therapeutic relationship  Continued Clinical Symptoms:  Bipolar Disorder:   Mixed State  Cognitive Features That Contribute To Risk:  None    Suicide Risk:  Minimal: No identifiable suicidal ideation.  Patients presenting with no risk factors but with morbid ruminations; may be classified as minimal risk based on the severity of the depressive symptoms  Principal Problem: Bipolar I disorder, most recent episode (or current) manic Discharge Diagnoses:  Patient Active Problem List   Diagnosis Date Noted  . Bipolar I disorder, most recent episode (or current) manic [F31.10] 04/01/2015  . Unspecified constipation [K59.00] 06/26/2013  . Nausea and vomiting [R11.2] 06/25/2013  . Cellulitis and abscess of foot [L03.119, L02.619] 06/23/2013  . DM (diabetes mellitus) [E11.9] 06/23/2013  . HTN (hypertension) [I10] 06/23/2013  . Tobacco abuse [Z72.0] 06/23/2013  . Hyperlipidemia [E78.5] 06/23/2013      Plan Of Care/Follow-up recommendations:  Activity:  as tolerated. Diet:  low sodium, low cholesterol, ADA diet. Other:  keep follow up appointments.  Is patient on multiple antipsychotic therapies at discharge:  No   Has Patient had three or more failed trials of antipsychotic monotherapy by history:  No  Recommended Plan for Multiple Antipsychotic Therapies: NA    [name] 04/05/2015, 8:42 AM

## 2015-04-15 ENCOUNTER — Encounter (HOSPITAL_COMMUNITY): Payer: Self-pay | Admitting: *Deleted

## 2015-04-15 ENCOUNTER — Emergency Department (HOSPITAL_COMMUNITY)
Admission: EM | Admit: 2015-04-15 | Discharge: 2015-04-15 | Disposition: A | Discharge status: Home / Self Care | Payer: Medicaid Other | Attending: Emergency Medicine | Admitting: Emergency Medicine

## 2015-04-15 ENCOUNTER — Emergency Department (HOSPITAL_COMMUNITY)
Admission: EM | Admit: 2015-04-15 | Discharge: 2015-04-15 | Discharge status: Against Medical Advice | Payer: Medicaid Other | Source: Home / Self Care | Attending: Emergency Medicine | Admitting: Emergency Medicine

## 2015-04-15 DIAGNOSIS — F419 Anxiety disorder, unspecified: Secondary | ICD-10-CM

## 2015-04-15 DIAGNOSIS — M545 Low back pain: Secondary | ICD-10-CM | POA: Insufficient documentation

## 2015-04-15 DIAGNOSIS — Y9389 Activity, other specified: Secondary | ICD-10-CM

## 2015-04-15 DIAGNOSIS — Y9289 Other specified places as the place of occurrence of the external cause: Secondary | ICD-10-CM

## 2015-04-15 DIAGNOSIS — L97529 Non-pressure chronic ulcer of other part of left foot with unspecified severity: Secondary | ICD-10-CM | POA: Diagnosis not present

## 2015-04-15 DIAGNOSIS — Z720 Tobacco use: Secondary | ICD-10-CM | POA: Insufficient documentation

## 2015-04-15 DIAGNOSIS — I10 Essential (primary) hypertension: Secondary | ICD-10-CM | POA: Insufficient documentation

## 2015-04-15 DIAGNOSIS — S91302A Unspecified open wound, left foot, initial encounter: Secondary | ICD-10-CM

## 2015-04-15 DIAGNOSIS — X58XXXA Exposure to other specified factors, initial encounter: Secondary | ICD-10-CM | POA: Insufficient documentation

## 2015-04-15 DIAGNOSIS — Z79899 Other long term (current) drug therapy: Secondary | ICD-10-CM

## 2015-04-15 DIAGNOSIS — E119 Type 2 diabetes mellitus without complications: Secondary | ICD-10-CM | POA: Insufficient documentation

## 2015-04-15 DIAGNOSIS — E780 Pure hypercholesterolemia: Secondary | ICD-10-CM

## 2015-04-15 DIAGNOSIS — L97519 Non-pressure chronic ulcer of other part of right foot with unspecified severity: Secondary | ICD-10-CM | POA: Insufficient documentation

## 2015-04-15 DIAGNOSIS — R1110 Vomiting, unspecified: Secondary | ICD-10-CM

## 2015-04-15 DIAGNOSIS — Z7982 Long term (current) use of aspirin: Secondary | ICD-10-CM | POA: Diagnosis not present

## 2015-04-15 DIAGNOSIS — L03116 Cellulitis of left lower limb: Secondary | ICD-10-CM | POA: Insufficient documentation

## 2015-04-15 DIAGNOSIS — Y998 Other external cause status: Secondary | ICD-10-CM | POA: Insufficient documentation

## 2015-04-15 DIAGNOSIS — Z794 Long term (current) use of insulin: Secondary | ICD-10-CM | POA: Insufficient documentation

## 2015-04-15 DIAGNOSIS — M25572 Pain in left ankle and joints of left foot: Secondary | ICD-10-CM | POA: Diagnosis present

## 2015-04-15 DIAGNOSIS — F309 Manic episode, unspecified: Secondary | ICD-10-CM | POA: Diagnosis not present

## 2015-04-15 MED ORDER — AMOXICILLIN-POT CLAVULANATE 875-125 MG PO TABS
1.0000 | ORAL_TABLET | Freq: Once | ORAL | Status: AC
  Administered 2015-04-15: 1 via ORAL
  Filled 2015-04-15: qty 1

## 2015-04-15 MED ORDER — AMOXICILLIN-POT CLAVULANATE 875-125 MG PO TABS: 1.0000 | ORAL_TABLET | Freq: Two times a day (BID) | ORAL | Status: DC

## 2015-04-15 MED ORDER — SULFAMETHOXAZOLE-TRIMETHOPRIM 800-160 MG PO TABS: 1.0000 | ORAL_TABLET | Freq: Two times a day (BID) | ORAL | Status: AC

## 2015-04-15 MED ORDER — ONDANSETRON 8 MG PO TBDP
8.0000 mg | ORAL_TABLET | Freq: Once | ORAL | Status: AC
  Administered 2015-04-15: 8 mg via ORAL
  Filled 2015-04-15: qty 1

## 2015-04-15 NOTE — ED Notes (Signed)
Large wound to bottom of left foot which has been there x 1 mo. Has been seen here previously for same and treated. Lower leg is now red which was first noticed today by "a nurse" Pt also states lower back pain. Pt states she is concerned she may have a UTI.

## 2015-04-15 NOTE — ED Notes (Signed)
Pt here for left foot wound, states she left AMA ~2 hours ago. Pt states "they were wanting to admit me earlier but I went home and smoked a cigarette and went by [employer]" Pt called EMS to bring her back in.

## 2015-04-15 NOTE — ED Provider Notes (Signed)
CSN: [id number]     Arrival date & time 04/15/15  1605 [id number]   First MD Initiated Contact with Patient 04/15/15 1727     Chief Complaint  Patient presents with  . Foot Pain     (Consider location/radiation/quality/duration/timing/severity/associated sxs/prior Treatment) HPI.Marland KitchenMarland KitchenMarland KitchenLevel 5 caveat for psychiatric disorder.   Patient has a known large ulcer on the ball of left foot.  She was just discharged from the emergency department 2 hours ago.   She is aware of her foot ulcer, but has not sought follow-up. She is not homicidal or suicidal.  Past Medical History  Diagnosis Date  . Diabetes mellitus without complication   . Hypertension   . Hypercholesterolemia    History reviewed. No pertinent past surgical history. Family History  Problem Relation Age of Onset  . Asthma Mother   . Cancer Mother   . Hypertension Mother   . Hypertension Father   . Diabetes Father   . Stroke Brother    History  Substance Use Topics  . Smoking status: Current Every Day Smoker -- 1.00 packs/day for 34 years    Types: Cigarettes  . Smokeless tobacco: Not on file  . Alcohol Use: Yes     Comment: occasionally   OB History    Gravida Para Term Preterm AB TAB SAB Ectopic Multiple Living   1 1             Review of Systems  Unable to perform ROS: Psychiatric disorder      Allergies  Review of patient's allergies indicates no known allergies.  Home Medications   Prior to Admission medications   Medication Sig Start Date End Date Taking? Authorizing Provider  acetaminophen (TYLENOL) 500 MG tablet Take 500 mg by mouth every 6 (six) hours as needed for pain.   Yes Historical Provider, MD  Ascorbic Acid (VITAMIN C PO) Take 1 tablet by mouth QID.   Yes Historical Provider, MD  aspirin 325 MG tablet Take 325 mg by mouth daily.   Yes Historical Provider, MD  collagenase (SANTYL) ointment Apply topically daily. 04/05/15  Yes Jolanta B Pucilowska, MD  divalproex (DEPAKOTE) 500 MG DR tablet Take 1  tablet (500 mg total) by mouth every 12 (twelve) hours. 04/05/15  Yes Jolanta B Pucilowska, MD  glipiZIDE-metformin (METAGLIP) 5-500 MG per tablet Take 1 tablet by mouth 2 (two) times daily before a meal. TAKES ONE AT LUNCH AND ONE AT EVENING MEAL   Yes Historical Provider, MD  ibuprofen (ADVIL,MOTRIN) 200 MG tablet Take 200 mg by mouth every 4 (four) hours as needed for mild pain or moderate pain.   Yes Historical Provider, MD  insulin glargine (LANTUS) 100 UNIT/ML injection Inject 0.18 mLs (18 Units total) into the skin at bedtime. 04/05/15  Yes Jolanta B Pucilowska, MD  lisinopril-hydrochlorothiazide (PRINZIDE,ZESTORETIC) 20-12.5 MG per tablet Take 1 tablet by mouth daily.   Yes Historical Provider, MD  nystatin cream (MYCOSTATIN) Apply 1 application topically 2 (two) times daily.   Yes Historical Provider, MD  Omega-3 Fatty Acids (FISH OIL PO) Take 1 capsule by mouth daily.   Yes Historical Provider, MD  pravastatin (PRAVACHOL) 40 MG tablet Take 40 mg by mouth every evening.   Yes Historical Provider, MD  risperiDONE (RISPERDAL) 2 MG tablet Take 1 tablet (2 mg total) by mouth 2 (two) times daily. 04/05/15  Yes Jolanta B Pucilowska, MD  sulfamethoxazole-trimethoprim (BACTRIM DS,SEPTRA DS) 800-160 MG per tablet Take 1 tablet by mouth 2 (two) times daily. 04/15/15 04/22/15 Yes Dorinda Hill  Wickline, MD  traZODone (DESYREL) 100 MG tablet Take 1 tablet (100 mg total) by mouth at bedtime. 04/05/15  Yes Jolanta B Pucilowska, MD  vitamin B-12 1000 MCG tablet Take 1 tablet (1,000 mcg total) by mouth daily. 04/06/15  Yes Jolanta B Pucilowska, MD  amoxicillin-clavulanate (AUGMENTIN) 875-125 MG per tablet Take 1 tablet by mouth 2 (two) times daily. One po bid x 7 days 04/15/15   Zadie Rhineonald Wickline, MD  insulin aspart (NOVOLOG) 100 UNIT/ML injection Inject 0-9 Units into the skin 3 (three) times daily with meals. Patient not taking: Reported on 04/15/2015 04/05/15   Shari ProwsJolanta B Pucilowska, MD   BP 91/48 mmHg  Pulse 88  Temp(Src) 98.6  F (37 C) (Oral)  Resp 22  Wt 150 lb (68.04 kg)  SpO2 100% Physical Exam  Constitutional: She is oriented to person, place, and time. She appears well-developed and well-nourished.  HENT:  Head: Normocephalic and atraumatic.  Eyes: Conjunctivae and EOM are normal. Pupils are equal, round, and reactive to light.  Neck: Normal range of motion. Neck supple.  Cardiovascular: Normal rate and regular rhythm.   Pulmonary/Chest: Effort normal and breath sounds normal.  Abdominal: Soft. Bowel sounds are normal.  Musculoskeletal: Normal range of motion.  Neurological: She is alert and oriented to person, place, and time.  Skin:  Bilateral foot ulcers left greater than right.  See photographs taken earlier today.  Psychiatric:  Manic behavior, flight of ideas  Nursing note and vitals reviewed.   ED Course  Procedures (including critical care time) Labs Review Labs Reviewed - No data to display  Imaging Review No results found.   EKG Interpretation None      MDM   Final diagnoses:  Foot ulcer, left  Ulcer of right foot    Patient is exhibiting manic behavior, but she is not suicidal or homicidal.  The large foot ulcer on the left has been noted.   She understands the need for follow-up. This is been documented on several previous visits.    Donnetta HutchingBrian Alexei Doswell, MD 04/15/15 [phone]

## 2015-04-15 NOTE — Discharge Instructions (Signed)
Skin Ulcer A skin ulcer is an open sore that can be shallow or deep. Skin ulcers sometimes become infected and are difficult to treat. It may be 1 month or longer before real healing progress is made. CAUSES   Injury.  Problems with the veins or arteries.  Diabetes.  Insect bites.  Bedsores.  Inflammatory conditions. SYMPTOMS   Pain, redness, swelling, and tenderness around the ulcer.  Fever.  Bleeding from the ulcer.  Yellow or clear fluid coming from the ulcer. DIAGNOSIS  There are many types of skin ulcers. Any open sores will be examined. Certain tests will be done to determine the kind of ulcer you have. The right treatment depends on the type of ulcer you have. TREATMENT  Treatment is a long-term challenge. It may include:  Wearing an elastic wrap, compression stockings, or gel cast over the ulcer area.  Taking antibiotic medicines or putting antibiotic creams on the affected area if there is an infection. HOME CARE INSTRUCTIONS  Put on your bandages (dressings), wraps, or casts over the ulcer as directed by your caregiver.  Change all dressings as directed by your caregiver.  Take all medicines as directed by your caregiver.  Keep the affected area clean and dry.  Avoid injuries to the affected area.  Eat a well-balanced, healthy diet that includes plenty of fruit and vegetables.  If you smoke, consider quitting or decreasing the amount of cigarettes you smoke.  Once the ulcer heals, get regular exercise as directed by your caregiver.  Work with your caregiver to make sure your blood pressure, cholesterol, and diabetes are well-controlled.  Keep your skin moisturized. Dry skin can crack and lead to skin ulcers. SEEK IMMEDIATE MEDICAL CARE IF:   Your pain gets worse.  You have swelling, redness, or fluids around the ulcer.  You have chills.  You have a fever. MAKE SURE YOU:   Understand these instructions.  Will watch your condition.  Will get  help right away if you are not doing well or get worse. Document Released: 10/25/2004 Document Revised: 12/10/2011 Document Reviewed: 05/04/2011 [facility] Patient Information 2015 [place], [place]. This information is not intended to replace advice given to you by your health care provider. Make sure you discuss any questions you have with your health care provider.  Follow-up with orthopedic surgeon for foot ulcer. Phone number given.

## 2015-04-15 NOTE — ED Provider Notes (Signed)
CSN: [id number]     Arrival date & time 04/15/15  1033 History  This chart was scribed for Zadie Rhineonald Daylen Hack, MD by Marica OtterNusrat Rahman, ED Scribe. This patient was seen in room APA12/APA12 and the patient's care was started at 10:53 AM.   Patient gave verbal permission to utilize photo for medical documentation only The image was not stored on any personal device  Chief Complaint  Patient presents with  . Wound Check   HPI PCP: PROVIDER NOT IN SYSTEM HPI Comments: Alyssa PlyBarbara A Pester is a 54 y.o. female, with PMHx noted below including DM, who presents to the Emergency Department complaining of large wound to the bottom of her left foot and a smaller wound to the bottom of her right foot with associated redness and swelling onset one month ago. Pt also complains of associated redness to her BLE. Pt denies fever, abd pain, but reports intermittent vomiting. Pt further denies numbness but reports weakness to her LE.   Pt's also complains of 7/10, sudden onset, atraumatic lower back pain. Pt reports taking tyleonol at home with no relief.   Past Medical History  Diagnosis Date  . Diabetes mellitus without complication   . Hypertension   . Hypercholesterolemia    History reviewed. No pertinent past surgical history. Family History  Problem Relation Age of Onset  . Asthma Mother   . Cancer Mother   . Hypertension Mother   . Hypertension Father   . Diabetes Father   . Stroke Brother    History  Substance Use Topics  . Smoking status: Current Every Day Smoker -- 1.00 packs/day for 34 years    Types: Cigarettes  . Smokeless tobacco: Not on file  . Alcohol Use: Yes     Comment: occasionally   OB History    Gravida Para Term Preterm AB TAB SAB Ectopic Multiple Living   1 1             Review of Systems  Constitutional: Negative for fever and chills.  Gastrointestinal: Positive for vomiting. Negative for abdominal pain.  Musculoskeletal: Positive for back pain (lower back pain ).  Skin:  Positive for color change and wound.        wound to the bottom of her left foot and a smaller wound to the bottom of her right foot with associated redness, swelling   Neurological: Positive for weakness (Lower extremities ). Negative for numbness.  All other systems reviewed and are negative.     Allergies  Review of patient's allergies indicates no known allergies.  Home Medications   Prior to Admission medications   Medication Sig Start Date End Date Taking? Authorizing Provider  collagenase (SANTYL) ointment Apply topically daily. 04/05/15  Yes Jolanta B Pucilowska, MD  glipiZIDE-metformin (METAGLIP) 5-500 MG per tablet Take 1 tablet by mouth 2 (two) times daily before a meal. TAKES ONE AT LUNCH AND ONE AT EVENING MEAL   Yes Historical Provider, MD  lisinopril-hydrochlorothiazide (PRINZIDE,ZESTORETIC) 20-12.5 MG per tablet Take 1 tablet by mouth daily.   Yes Historical Provider, MD  traZODone (DESYREL) 100 MG tablet Take 1 tablet (100 mg total) by mouth at bedtime. 04/05/15  Yes Jolanta B Pucilowska, MD  acetaminophen (TYLENOL) 500 MG tablet Take 500 mg by mouth every 6 (six) hours as needed for pain.    Historical Provider, MD  divalproex (DEPAKOTE) 500 MG DR tablet Take 1 tablet (500 mg total) by mouth every 12 (twelve) hours. 04/05/15   Shari ProwsJolanta B Pucilowska, MD  insulin  aspart (NOVOLOG) 100 UNIT/ML injection Inject 0-9 Units into the skin 3 (three) times daily with meals. 04/05/15   Shari Prows, MD  insulin glargine (LANTUS) 100 UNIT/ML injection Inject 0.18 mLs (18 Units total) into the skin at bedtime. 04/05/15   Shari Prows, MD  Omega-3 Fatty Acids (FISH OIL PO) Take 1 capsule by mouth daily.    Historical Provider, MD  pravastatin (PRAVACHOL) 40 MG tablet Take 40 mg by mouth every evening.    Historical Provider, MD  risperiDONE (RISPERDAL) 2 MG tablet Take 1 tablet (2 mg total) by mouth 2 (two) times daily. 04/05/15   Shari Prows, MD  vitamin B-12 1000 MCG tablet  Take 1 tablet (1,000 mcg total) by mouth daily. 04/06/15   Shari Prows, MD   Triage Vitals: BP 118/54 mmHg  Pulse 95  Temp(Src) 98.7 F (37.1 C) (Oral)  Resp 16  SpO2 97% Physical Exam CONSTITUTIONAL: disheveled.  HEAD: Normocephalic/atraumatic EYES: EOMI/PERRL ENMT: Mucous membranes moist NECK: supple no meningeal signs SPINE/BACK:entire spine nontender CV: S1/S2 noted, no murmurs/rubs/gallops noted LUNGS: Lungs are clear to auscultation bilaterally, no apparent distress ABDOMEN: soft, nontender, no rebound or guarding, bowel sounds noted throughout abdomen NEURO: Pt is awake/alert/appropriate, moves all extremitiesx4.  No facial droop.   EXTREMITIES: pulses normal/equal, full ROM. She has erythema noted to dorsal aspect of left foot with tenderness.   See photo below SKIN: warml PSYCH: anxious       ED Course  Procedures  DIAGNOSTIC STUDIES: Oxygen Saturation is 97% on RA, nl by my interpretation.    COORDINATION OF CARE: 11:00 AM-Discussed treatment plan which includes hospital admittance. Pt refuses to be admitted to the hospital. Pt advised of possible dire consequences of leaving AMA including losing her foot (amputation) or even death, pt verbalizes understanding and states she wants to leave regardless.  Oral antibiotics ordered for patient for discharge and outpatient referrals given  I discussed risk of death/disability of leaving against medical advice and the patient accepts these risks.  The patient is awake/alert able to make decisions, and does not appear intoxicated Patient discharged against medical advice.        MDM   Final diagnoses:  None    Nursing notes including past medical history and social history reviewed and considered in documentation   I personally performed the services described in this documentation, which was scribed in my presence. The recorded information has been reviewed and is accurate.       Zadie Rhine,  MD 04/15/15 [phone]

## 2015-12-13 ENCOUNTER — Other Ambulatory Visit: Payer: Self-pay | Admitting: Adult Health

## 2015-12-13 ENCOUNTER — Encounter: Payer: Self-pay | Admitting: Adult Health

## 2015-12-13 ENCOUNTER — Ambulatory Visit (INDEPENDENT_AMBULATORY_CARE_PROVIDER_SITE_OTHER): Payer: Medicaid Other | Admitting: Adult Health

## 2015-12-13 ENCOUNTER — Other Ambulatory Visit (HOSPITAL_COMMUNITY)
Admission: RE | Admit: 2015-12-13 | Discharge: 2015-12-13 | Disposition: A | Discharge status: Home / Self Care | Payer: Medicaid Other | Source: Ambulatory Visit | Attending: Adult Health | Admitting: Adult Health

## 2015-12-13 VITALS — BP 110/60 | HR 88 | Ht 66.0 in | Wt 176.0 lb

## 2015-12-13 DIAGNOSIS — Z01419 Encounter for gynecological examination (general) (routine) without abnormal findings: Secondary | ICD-10-CM | POA: Diagnosis not present

## 2015-12-13 DIAGNOSIS — Z1212 Encounter for screening for malignant neoplasm of rectum: Secondary | ICD-10-CM | POA: Diagnosis not present

## 2015-12-13 DIAGNOSIS — F17200 Nicotine dependence, unspecified, uncomplicated: Secondary | ICD-10-CM

## 2015-12-13 DIAGNOSIS — Z1151 Encounter for screening for human papillomavirus (HPV): Secondary | ICD-10-CM | POA: Insufficient documentation

## 2015-12-13 DIAGNOSIS — Z1231 Encounter for screening mammogram for malignant neoplasm of breast: Secondary | ICD-10-CM

## 2015-12-13 DIAGNOSIS — L732 Hidradenitis suppurativa: Secondary | ICD-10-CM

## 2015-12-13 DIAGNOSIS — Z124 Encounter for screening for malignant neoplasm of cervix: Secondary | ICD-10-CM

## 2015-12-13 DIAGNOSIS — Z0000 Encounter for general adult medical examination without abnormal findings: Secondary | ICD-10-CM | POA: Diagnosis not present

## 2015-12-13 HISTORY — DX: Nicotine dependence, unspecified, uncomplicated: F17.200

## 2015-12-13 HISTORY — DX: Hidradenitis suppurativa: L73.2

## 2015-12-13 LAB — HEMOCCULT GUIAC POC 1CARD (OFFICE): FECAL OCCULT BLD: NEGATIVE

## 2015-12-13 NOTE — Progress Notes (Signed)
Patient ID: [name], female   DOB: 1961/09/13, 55 y.o.   MRN: [id number] History of Present Illness:  [name] is a 55 year old white female, in for well woman gyn exam and pap, she has no complaints.She has she go the flu shot in November at Dr Letitia NeriFanta's office. PCP is Dr Felecia ShellingFanta.  Current Medications, Allergies, Past Medical History, Past Surgical History, Family History and Social History [facility].     Review of Systems: Patient denies any headaches, hearing loss, fatigue, blurred vision, shortness of breath, chest pain, abdominal pain, problems with bowel movements, urination, or intercourse.(not having sex). No joint pain or mood swings. Reviewed past medical,surgical, social and family history. Reviewed medications and allergies.     Physical Exam:BP 110/60 mmHg  Pulse 88  Ht 5\' 6"  (1.676 m)  Wt 176 lb (79.833 kg)  BMI 28.42 kg/m2 General:  Well developed, well nourished, no acute distress Skin:  Warm and dry, has hidradenitis under both arms and breasts and between thighs Neck:  Midline trachea, normal thyroid, good ROM, no lymphadenopathy Lungs; Clear to auscultation bilaterally Breast:  No dominant palpable mass, retraction, or nipple discharge Cardiovascular: Regular rate and rhythm Abdomen:  Soft, non tender, no hepatosplenomegaly Pelvic:  External genitalia is normal in appearance, no lesions.  The vagina is normal in appearance. Urethra has no lesions or masses. The cervix is smooth, pap with HPV performed.  Uterus is felt to be normal size, shape, and contour.  No adnexal masses or tenderness noted.Bladder is non tender, no masses felt. Rectal: Good sphincter tone, no polyps, or hemorrhoids felt.  Hemoccult negative. Extremities/musculoskeletal:  No swelling or varicosities noted, no clubbing or cyanosis Psych:  No mood changes, alert and cooperative,seems happy Try to decrease cigarettes  Impression: Well woman gyn  exam and pap Hidradenitis Smoker     Plan: Physical in 1 year, pap in 3 if normal Mammogram 3/15 at 9:30 am appt made for her Labs with PCP She was given number to call Dr Darrick PennaFields for colonoscopy, is did not get there last year

## 2015-12-13 NOTE — Patient Instructions (Signed)
[facility], pap in 3 cancer Labs with dr Felecia ShellingFanta Call Dr Darrick PennaFields for colonoscopy

## 2015-12-14 ENCOUNTER — Ambulatory Visit (HOSPITAL_COMMUNITY)
Admission: RE | Admit: 2015-12-14 | Discharge: 2015-12-14 | Disposition: A | Discharge status: Home / Self Care | Payer: Medicaid Other | Source: Ambulatory Visit | Attending: Adult Health | Admitting: Adult Health

## 2015-12-14 DIAGNOSIS — Z1231 Encounter for screening mammogram for malignant neoplasm of breast: Secondary | ICD-10-CM | POA: Diagnosis present

## 2015-12-15 LAB — CYTOLOGY - PAP

## 2016-02-03 ENCOUNTER — Encounter (HOSPITAL_COMMUNITY): Payer: Self-pay | Admitting: Emergency Medicine

## 2016-02-03 ENCOUNTER — Emergency Department (HOSPITAL_COMMUNITY)
Admission: EM | Admit: 2016-02-03 | Discharge: 2016-02-03 | Disposition: A | Discharge status: Home / Self Care | Payer: Medicaid Other | Attending: Emergency Medicine | Admitting: Emergency Medicine

## 2016-02-03 DIAGNOSIS — F17210 Nicotine dependence, cigarettes, uncomplicated: Secondary | ICD-10-CM | POA: Diagnosis not present

## 2016-02-03 DIAGNOSIS — I10 Essential (primary) hypertension: Secondary | ICD-10-CM | POA: Diagnosis not present

## 2016-02-03 DIAGNOSIS — Z7984 Long term (current) use of oral hypoglycemic drugs: Secondary | ICD-10-CM | POA: Insufficient documentation

## 2016-02-03 DIAGNOSIS — Z794 Long term (current) use of insulin: Secondary | ICD-10-CM | POA: Insufficient documentation

## 2016-02-03 DIAGNOSIS — E119 Type 2 diabetes mellitus without complications: Secondary | ICD-10-CM | POA: Diagnosis not present

## 2016-02-03 DIAGNOSIS — L0291 Cutaneous abscess, unspecified: Secondary | ICD-10-CM

## 2016-02-03 DIAGNOSIS — Z79899 Other long term (current) drug therapy: Secondary | ICD-10-CM | POA: Insufficient documentation

## 2016-02-03 DIAGNOSIS — M79622 Pain in left upper arm: Secondary | ICD-10-CM | POA: Diagnosis present

## 2016-02-03 DIAGNOSIS — L02412 Cutaneous abscess of left axilla: Secondary | ICD-10-CM | POA: Insufficient documentation

## 2016-02-03 LAB — CBG MONITORING, ED: GLUCOSE-CAPILLARY: 215 mg/dL — AB (ref 65–99)

## 2016-02-03 MED ORDER — SULFAMETHOXAZOLE-TRIMETHOPRIM 800-160 MG PO TABS
1.0000 | ORAL_TABLET | Freq: Once | ORAL | Status: AC
  Administered 2016-02-03: 1 via ORAL
  Filled 2016-02-03: qty 1

## 2016-02-03 MED ORDER — LIDOCAINE-EPINEPHRINE (PF) 2 %-1:200000 IJ SOLN
10.0000 mL | Freq: Once | INTRAMUSCULAR | Status: AC
  Administered 2016-02-03: 10 mL via INTRADERMAL
  Filled 2016-02-03: qty 20

## 2016-02-03 MED ORDER — HYDROCODONE-ACETAMINOPHEN 5-325 MG PO TABS
1.0000 | ORAL_TABLET | Freq: Once | ORAL | Status: AC
  Administered 2016-02-03: 1 via ORAL
  Filled 2016-02-03: qty 1

## 2016-02-03 MED ORDER — SULFAMETHOXAZOLE-TRIMETHOPRIM 800-160 MG PO TABS: 1.0000 | ORAL_TABLET | Freq: Two times a day (BID) | ORAL | Status: AC

## 2016-02-03 NOTE — ED Notes (Signed)
Large area of redness noted to left axilla. Pt reports vomiting a few days ago. Denies any known fevers. nad noted.

## 2016-02-03 NOTE — ED Notes (Signed)
Clean dry dressing applied to left axilla at this time and pt verbalized understanding on instructions for dressing changes at home.

## 2016-02-03 NOTE — ED Provider Notes (Signed)
CSN: [id number]     Arrival date & time 02/03/16  1016 History   First MD Initiated Contact with Patient 02/03/16 1048     Chief Complaint  Patient presents with  . Abscess     (Consider location/radiation/quality/duration/timing/severity/associated sxs/prior Treatment) HPI   [name] is a 55 y.o. female who presents to the Emergency Department complaining of pain, redness and swollen area to her left axilla.  Onset 1-2 days ago.  She complains of pain worse with arm movement.  She states she has boils in the past and symptoms feel similar to previous.  She has been applying warm compresses without relief.  She tried to see her PMD today, but he wasn't in the office.  She denies drainage, fever, chills, numbness or weakness of the extremity.     Past Medical History  Diagnosis Date  . Diabetes mellitus without complication (HCC)   . Hypertension   . Hypercholesterolemia   . Hidradenitis 12/13/2015  . Smoker 12/13/2015   History reviewed. No pertinent past surgical history. Family History  Problem Relation Age of Onset  . Asthma Mother   . Cancer Mother   . Hypertension Mother   . Diabetes Mother   . Hypertension Father   . Diabetes Father   . Stroke Brother   . Hypertension Maternal Grandmother   . Diabetes Maternal Grandmother   . Hyperlipidemia Maternal Grandmother   . Hypertension Maternal Grandfather   . Hyperlipidemia Maternal Grandfather   . Hypertension Paternal Grandmother   . Hyperlipidemia Paternal Grandmother   . Hypertension Paternal Grandfather   . Hyperlipidemia Paternal Grandfather    Social History  Substance Use Topics  . Smoking status: Current Every Day Smoker -- 0.50 packs/day for 34 years    Types: Cigarettes  . Smokeless tobacco: Never Used  . Alcohol Use: No   OB History    Gravida Para Term Preterm AB TAB SAB Ectopic Multiple Living   1 1             Review of Systems  Constitutional: Negative for fever and chills.   Gastrointestinal: Negative for nausea and vomiting.  Musculoskeletal: Negative for joint swelling and arthralgias.  Skin: Positive for color change.       Abscess left axilla  Hematological: Negative for adenopathy.  All other systems reviewed and are negative.     Allergies  Review of patient's allergies indicates no known allergies.  Home Medications   Prior to Admission medications   Medication Sig Start Date End Date Taking? Authorizing Provider  acetaminophen (TYLENOL) 500 MG tablet Take 500 mg by mouth every 6 (six) hours as needed for pain.    Historical Provider, MD  Ascorbic Acid (VITAMIN C PO) Take 1 tablet by mouth daily.     Historical Provider, MD  gabapentin (NEURONTIN) 300 MG capsule Take 300 mg by mouth at bedtime.    Historical Provider, MD  glipiZIDE-metformin (METAGLIP) 5-500 MG per tablet Take 1 tablet by mouth 2 (two) times daily before a meal. TAKES ONE AT LUNCH AND ONE AT [facility]    Historical Provider, MD  insulin glargine (LANTUS) 100 UNIT/ML injection Inject 0.18 mLs (18 Units total) into the skin at bedtime. 04/05/15   Shari ProwsJolanta B Pucilowska, MD  lisinopril-hydrochlorothiazide (PRINZIDE,ZESTORETIC) 20-12.5 MG per tablet Take 1 tablet by mouth daily.    Historical Provider, MD  pravastatin (PRAVACHOL) 40 MG tablet Take 40 mg by mouth every evening.    Historical Provider, MD   BP 115/77  mmHg  Pulse 102  Temp(Src) 98.4 F (36.9 C) (Oral)  Resp 18  Ht  (1.651 m)  Wt 79.379 kg  BMI 29.12 kg/m2  SpO2 99% Physical Exam  Constitutional: She is oriented to person, place, and time. She appears well-developed and well-nourished. No distress.  HENT:  Head: Normocephalic and atraumatic.  Neck: Normal range of motion. Neck supple.  Cardiovascular: Normal rate and regular rhythm.   Pulmonary/Chest: Effort normal and breath sounds normal. No respiratory distress.  Lymphadenopathy:    She has no cervical adenopathy.  Neurological: She is alert and  oriented to person, place, and time. She exhibits normal muscle tone. Coordination normal.  Skin: Skin is warm and dry. There is erythema.  Focal erythema, fluctuance and induration of left axilla.  No drainage  Psychiatric: She has a normal mood and affect.  Nursing note and vitals reviewed.   ED Course  Procedures (including critical care time) Labs Review Labs Reviewed  CBG MONITORING, ED - Abnormal; Notable for the following:    Glucose-Capillary 215 (*)    All other components within normal limits    Imaging Review No results found. I have personally reviewed and evaluated these images and lab results as part of my medical decision-making.   EKG Interpretation None       INCISION AND DRAINAGE Performed by: Maxwell Caul. Consent: Verbal consent obtained. Risks and benefits: risks, benefits and alternatives were discussed Type: abscess  Body area: left axilla  Anesthesia: local infiltration  Incision X 2 was made with a #11 scalpel.  Local anesthetic: lidocaine 2% with epinephrine  Anesthetic total: 3 ml  Complexity: complex Blunt dissection to break up loculations  Drainage: purulent  Drainage amount: moderate  Packing material: 1/4 in iodoform gauze x 2  Patient tolerance: Patient tolerated the procedure well with no immediate complications.     MDM   Final diagnoses:  Abscess   Pt well appearing, non-toxic. Vitals stable, no clinical sx's for DKA.  Large abscess to left axilla with I&D performed.  Pt agrees to warm wet compresses and ER return in two days for recheck and packing removal.  I also explained that if she feels comfortable removing packing and area is improving, that she can return only if needed.      Pauline Aus, PA-C 02/03/16 1238  Zadie Rhine, MD 02/03/16 [phone]

## 2016-02-03 NOTE — ED Notes (Signed)
CBG 215 

## 2016-02-03 NOTE — Discharge Instructions (Signed)
Abscess °An abscess (boil or furuncle) is an infected area on or under the skin. This area is filled with yellowish-white fluid (pus) and other material (debris). °HOME CARE  °· Only take medicines as told by your doctor. °· If you were given antibiotic medicine, take it as directed. Finish the medicine even if you start to feel better. °· If gauze is used, follow your doctor's directions for changing the gauze. °· To avoid spreading the infection: °¨ Keep your abscess covered with a bandage. °¨ Wash your hands well. °¨ Do not share personal care items, towels, or whirlpools with others. °¨ Avoid skin contact with others. °· Keep your skin and clothes clean around the abscess. °· Keep all doctor visits as told. °GET HELP RIGHT AWAY IF:  °· You have more pain, puffiness (swelling), or redness in the wound site. °· You have more fluid or blood coming from the wound site. °· You have muscle aches, chills, or you feel sick. °· You have a fever. °MAKE SURE YOU:  °· Understand these instructions. °· Will watch your condition. °· Will get help right away if you are not doing well or get worse. °  °This information is not intended to replace advice given to you by your health care provider. Make sure you discuss any questions you have with your health care provider. °  °Document Released: 03/05/2008 Document Revised: 03/18/2012 Document Reviewed: 12/01/2011 °Elsevier Interactive Patient Education ©2016 [employer]. ° °

## 2016-02-06 ENCOUNTER — Encounter (HOSPITAL_COMMUNITY): Payer: Self-pay | Admitting: Emergency Medicine

## 2016-02-06 ENCOUNTER — Emergency Department (HOSPITAL_COMMUNITY)
Admission: EM | Admit: 2016-02-06 | Discharge: 2016-02-06 | Disposition: A | Discharge status: Home / Self Care | Payer: Medicaid Other | Attending: Emergency Medicine | Admitting: Emergency Medicine

## 2016-02-06 DIAGNOSIS — F17210 Nicotine dependence, cigarettes, uncomplicated: Secondary | ICD-10-CM | POA: Diagnosis not present

## 2016-02-06 DIAGNOSIS — E119 Type 2 diabetes mellitus without complications: Secondary | ICD-10-CM | POA: Insufficient documentation

## 2016-02-06 DIAGNOSIS — Z09 Encounter for follow-up examination after completed treatment for conditions other than malignant neoplasm: Secondary | ICD-10-CM

## 2016-02-06 DIAGNOSIS — Z4800 Encounter for change or removal of nonsurgical wound dressing: Secondary | ICD-10-CM | POA: Insufficient documentation

## 2016-02-06 DIAGNOSIS — I10 Essential (primary) hypertension: Secondary | ICD-10-CM | POA: Insufficient documentation

## 2016-02-06 MED ORDER — HYDROCODONE-ACETAMINOPHEN 5-325 MG PO TABS: ORAL_TABLET | ORAL | Status: DC

## 2016-02-06 MED ORDER — ONDANSETRON HCL 4 MG PO TABS: 4.0000 mg | ORAL_TABLET | Freq: Four times a day (QID) | ORAL | Status: DC

## 2016-02-06 NOTE — ED Notes (Addendum)
Tammy PA notified of low BP, pt walked around nurses station with no difficulty and given water to drink. Pt has no complaints at this time.

## 2016-02-06 NOTE — ED Notes (Signed)
[name] notified that pt bp 91/57, pt has been given the ok to be discharged.

## 2016-02-06 NOTE — ED Notes (Addendum)
PT states was seen in ED on 02/03/16 for abscess to left axilla and was told to come back to ED today for re-evaluation. PT states she has had nausea and vomiting since the start of her antibiotics but states she was talking the antibiotics before she ate.

## 2016-02-06 NOTE — ED Notes (Signed)
Dressing applied to pt left axilla.

## 2016-02-06 NOTE — ED Notes (Signed)
Pt made aware to return if symptoms worsen or if any life threatening symptoms occur.   

## 2016-02-08 NOTE — ED Provider Notes (Signed)
CSN: [id number]     Arrival date & time 02/06/16  0825 History   First MD Initiated Contact with Patient 02/06/16 [phone]     Chief Complaint  Patient presents with  . Wound Check     (Consider location/radiation/quality/duration/timing/severity/associated sxs/prior Treatment) HPI   [name] is a 55 y.o. female who presents to the Emergency Department for recheck of a previous abscess I&D and for packing removal.  She reports improvement of her symptoms including redness, swelling to the site, but reports nausea and intermittent vomiting since taking the antibiotic.  She admits that she has been taking the medication on an empty stomach.  She denies fever, chills, abdominal pain, rash, and difficulty swallowing or breathing.     Past Medical History  Diagnosis Date  . Diabetes mellitus without complication (HCC)   . Hypertension   . Hypercholesterolemia   . Hidradenitis 12/13/2015  . Smoker 12/13/2015   History reviewed. No pertinent past surgical history. Family History  Problem Relation Age of Onset  . Asthma Mother   . Cancer Mother   . Hypertension Mother   . Diabetes Mother   . Hypertension Father   . Diabetes Father   . Stroke Brother   . Hypertension Maternal Grandmother   . Diabetes Maternal Grandmother   . Hyperlipidemia Maternal Grandmother   . Hypertension Maternal Grandfather   . Hyperlipidemia Maternal Grandfather   . Hypertension Paternal Grandmother   . Hyperlipidemia Paternal Grandmother   . Hypertension Paternal Grandfather   . Hyperlipidemia Paternal Grandfather    Social History  Substance Use Topics  . Smoking status: Current Every Day Smoker -- 0.50 packs/day for 34 years    Types: Cigarettes  . Smokeless tobacco: Never Used  . Alcohol Use: No   OB History    Gravida Para Term Preterm AB TAB SAB Ectopic Multiple Living   1 1             Review of Systems  Constitutional: Negative for fever and chills.  Gastrointestinal: Negative for  nausea and vomiting.  Musculoskeletal: Negative for joint swelling and arthralgias.  Skin: Positive for color change.       Abscess   Hematological: Negative for adenopathy.  All other systems reviewed and are negative.     Allergies  Review of patient's allergies indicates no known allergies.  Home Medications   Prior to Admission medications   Medication Sig Start Date End Date Taking? Authorizing Provider  acetaminophen (TYLENOL) 500 MG tablet Take 500 mg by mouth every 6 (six) hours as needed for pain.    Historical Provider, MD  Ascorbic Acid (VITAMIN C PO) Take 1 tablet by mouth daily. Reported on 02/03/2016    Historical Provider, MD  gabapentin (NEURONTIN) 300 MG capsule Take 300 mg by mouth at bedtime.    Historical Provider, MD  glipiZIDE-metformin (METAGLIP) 5-500 MG per tablet Take 1 tablet by mouth 2 (two) times daily before a meal. TAKES ONE AT LUNCH AND ONE AT [facility] MEAL    Historical Provider, MD  HYDROcodone-acetaminophen (NORCO/VICODIN) 5-325 MG tablet Take one tab po q 4-6 hrs prn pain 02/06/16   Dejana Pugsley, PA-C  insulin glargine (LANTUS) 100 UNIT/ML injection Inject 0.18 mLs (18 Units total) into the skin at bedtime. 04/05/15   Shari Prows, MD  lisinopril-hydrochlorothiazide (PRINZIDE,ZESTORETIC) 20-12.5 MG per tablet Take 1 tablet by mouth daily.    Historical Provider, MD  ondansetron (ZOFRAN) 4 MG tablet Take 1 tablet (4 mg total) by mouth  every 6 (six) hours. Prn nausea/vomiting 02/06/16   Fransisca Shawn, PA-C  pravastatin (PRAVACHOL) 40 MG tablet Take 40 mg by mouth every evening.    Historical Provider, MD  sulfamethoxazole-trimethoprim (BACTRIM DS,SEPTRA DS) 800-160 MG tablet Take 1 tablet by mouth 2 (two) times daily. 02/03/16 02/10/16  Lebaron Bautch, PA-C   BP 91/57 mmHg  Pulse 90  Temp(Src) 99.4 F (37.4 C) (Oral)  Resp 16  Ht 5\' 5"  (1.651 m)  Wt 77.111 kg  BMI 28.29 kg/m2  SpO2 91% Physical Exam  Constitutional: She is oriented to person,  place, and time. She appears well-developed and well-nourished. No distress.  HENT:  Head: Normocephalic and atraumatic.  Cardiovascular: Normal rate and regular rhythm.   No murmur heard. Pulmonary/Chest: Effort normal and breath sounds normal. No respiratory distress.  Neurological: She is alert and oriented to person, place, and time. She exhibits normal muscle tone. Coordination normal.  Skin: Skin is warm and dry. There is erythema.  Abscess to the left axilla with previous I&D.  Packing in place.  Improved erythema.  Moderate edema remains with slight purulent drainage. Erythema improved.  Nursing note and vitals reviewed.   ED Course  Procedures (including critical care time) Labs Review Labs Reviewed - No data to display  Imaging Review No results found. I have personally reviewed and evaluated these images and lab results as part of my medical decision-making.   EKG Interpretation None      MDM   Final diagnoses:  Encounter for recheck of abscess following incision and drainage    Pt is well appearing.  Abscess appears improved from previous visit.  Packing removed by me without difficulty.  Slight drainage remains.  Pt agrees to continue warm soaks and antibiotic as prescribed.  Advised to return if needed  Pauline Ausammy Jheri Mitter, PA-C 02/08/16 2101  Lavera Guiseana Duo Liu, MD 02/09/16 [phone]

## 2016-03-06 IMAGING — MR MR FOOT*L* WO/W CM
8 of 9 series · 36 of 40 positions shown · IV contrast (multihance)
Comparison: Radiographs 06/23/2013. No recent plain film
evaluation.

CLINICAL DATA: Diabetic foot ulcers bilaterally. Osteomyelitis of
the LEFT foot. Initial encounter.

EXAM:
MRI OF THE LEFT FOREFOOT WITHOUT AND WITH CONTRAST
TECHNIQUE: Multiplanar, multisequence MR imaging was performed both before and
after administration of intravenous contrast.
CONTRAST:  14mL MULTIHANCE GADOBENATE DIMEGLUMINE 529 MG/ML IV SOLN

[Series 4: T1 · coronal · 3.0mm · 0.75mm/px · 5 of 45 slices shown (1 of 2)]
[im 1/45]
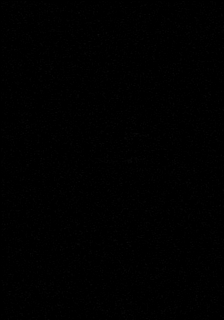
[im 12/45]
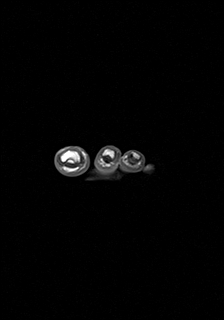
[im 23/45]
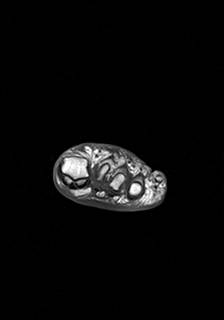
[im 34/45]
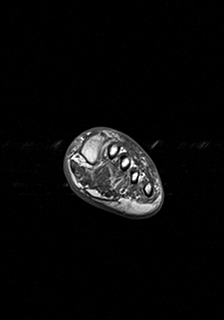
[im 45/45]
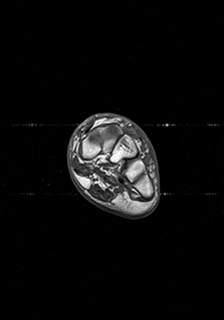

[Series 5: axial t1fs (only · coronal · 3.0mm · 0.94mm/px · 6 of 45 slices shown]
[im 1/45]
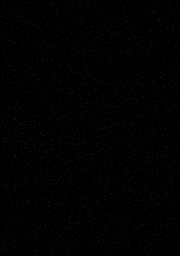
[im 9/45]
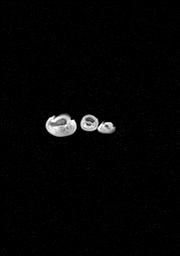
[im 18/45]
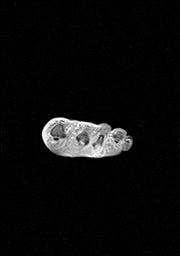
[im 27/45]
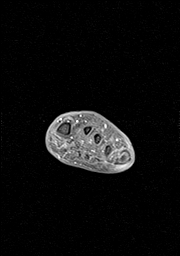
[im 36/45]
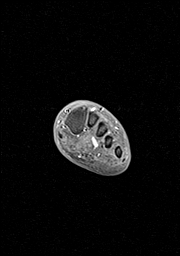
[im 45/45]
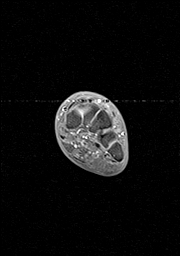

[Series 6: T2 fat-sat · coronal · 3.0mm · 0.43mm/px · 6 of 45 slices shown]
[im 1/45]
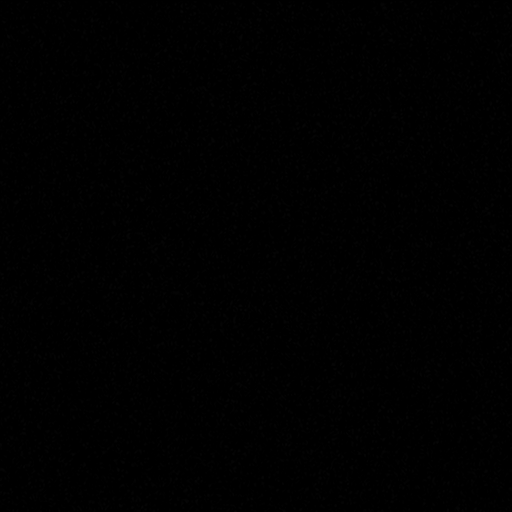
[im 9/45]
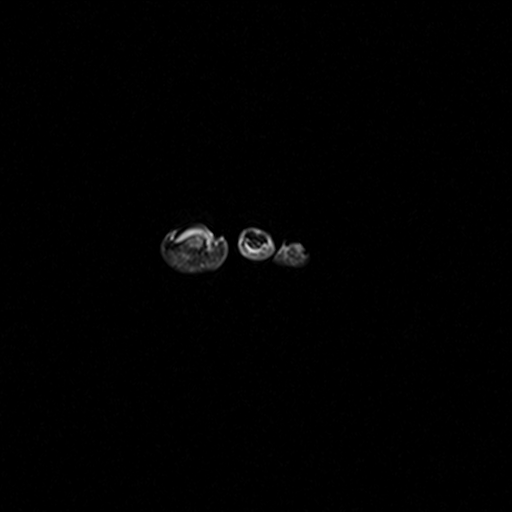
[im 18/45]
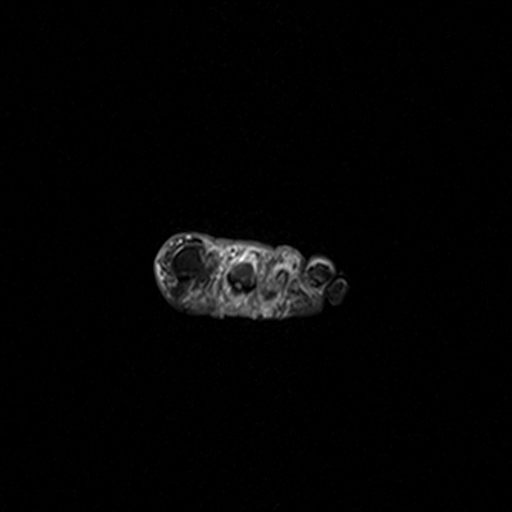
[im 27/45]
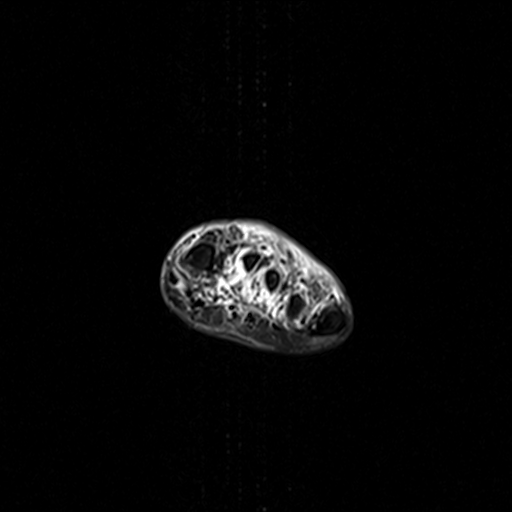
[im 36/45]
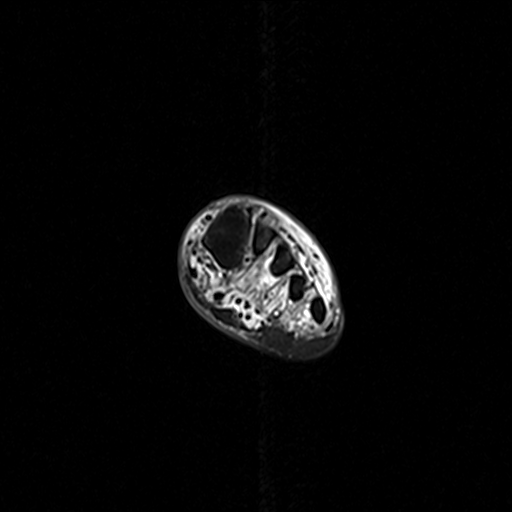
[im 45/45]
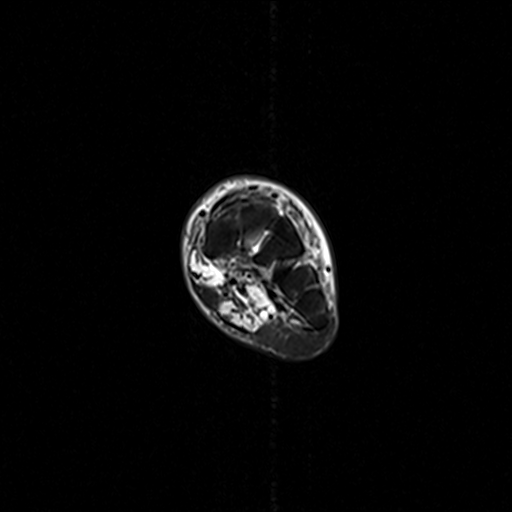

[Series 7: T1 · oblique · 3.0mm · 0.62mm/px · 3 of 25 slices shown (2 of 2)]
[im 1/25]
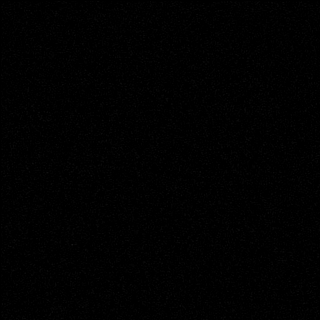
[im 13/25]
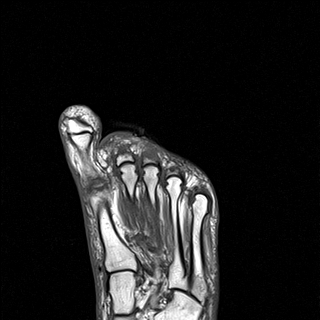
[im 25/25]
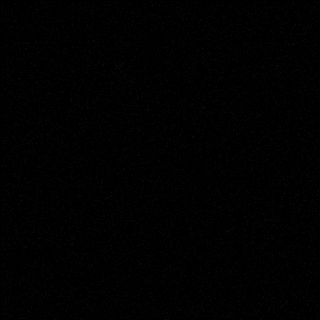

[Series 9: STIR · sagittal · 3.0mm · 0.39mm/px · 4 of 33 slices shown (1 of 2)]
[im 1/33]
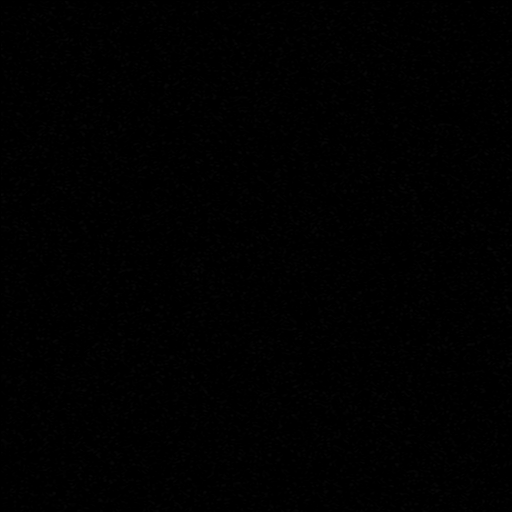
[im 11/33]
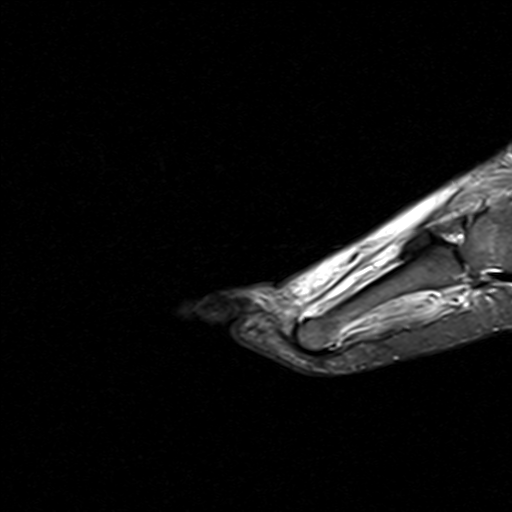
[im 22/33]
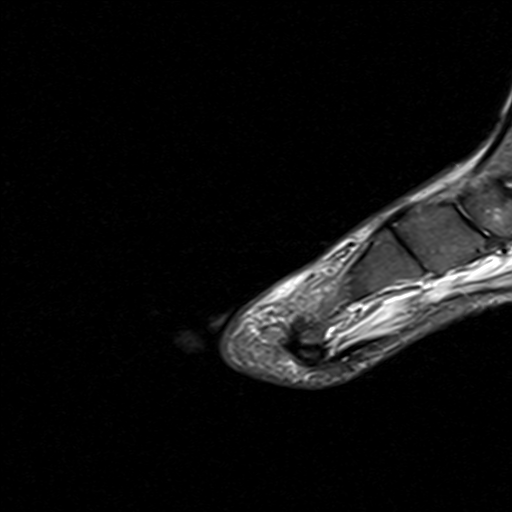
[im 33/33]
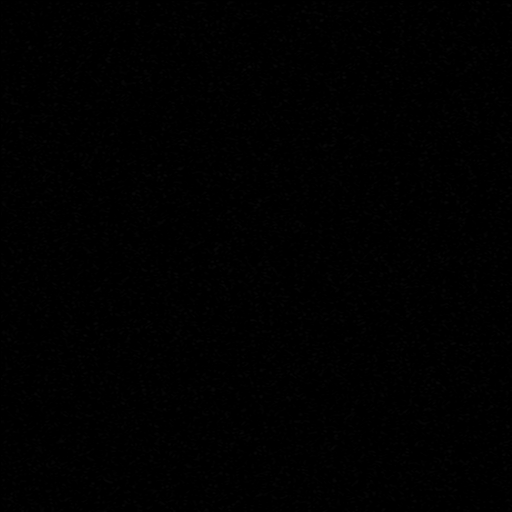

[Series 10: STIR · oblique · 3.0mm · 0.39mm/px · 2 of 25 slices shown (2 of 2)]
[im 1/25]
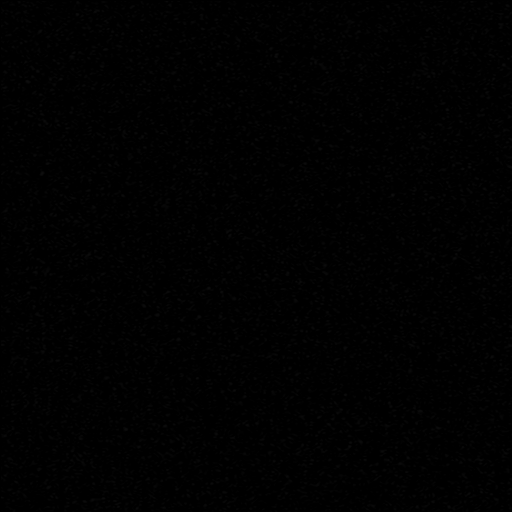
[im 13/25]
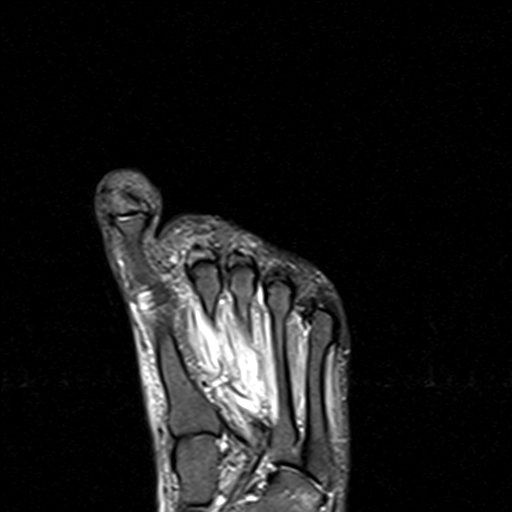

[Series 11: T1 fat-sat · coronal · 3.0mm · 0.94mm/px · 6 of 45 slices shown (1 of 2)]
[im 1/45]
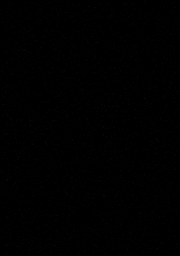
[im 9/45]
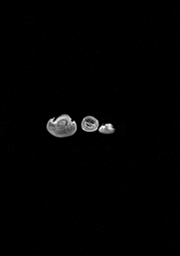
[im 18/45]
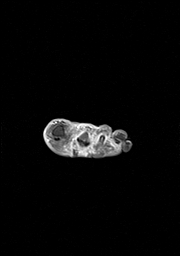
[im 27/45]
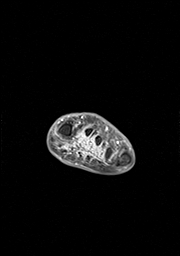
[im 36/45]
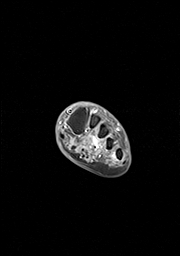
[im 45/45]
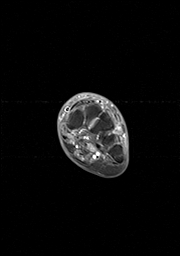

[Series 13: T1 fat-sat · sagittal · 3.0mm · 0.78mm/px · 4 of 33 slices shown (2 of 2)]
[im 1/33]
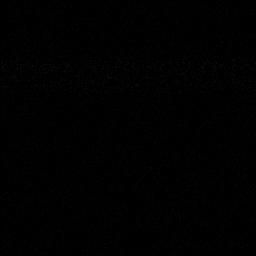
[im 11/33]
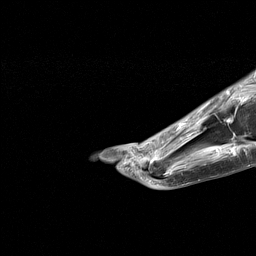
[im 22/33]
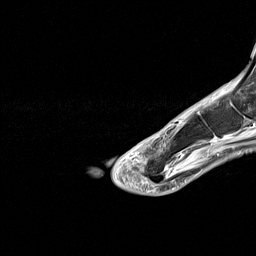
[im 33/33]
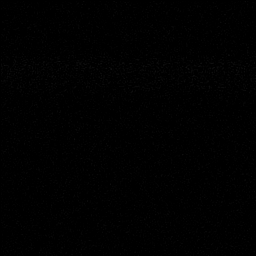

[36 of 40 positions shown; findings below may reference images not displayed]

FINDINGS: Plantar foot ulcers are present between the first through third
webspaces, best seen on the short axis axial images (image 30 series
for). There is no soft tissue abscess. Negative for osteomyelitis.
Phlegmon is present surrounding the areas of ulceration.

There is edema in the plantar base of the proximal phalanx of the
third toe, best seen on the post gadolinium images. There is no
cortical osteolysis or effacement of fatty marrow to suggest
osteomyelitis in this is compatible with reactive edema. Otherwise
bone marrow signal is within normal limits.

Plantar forefoot musculature diabetic myopathy.

Diffuse forefoot edema is present with mild enhancement compatible
with cellulitis in the appropriate clinical setting.
IMPRESSION: 1. Ulcerations over the plantar aspect of the second, third, and
fourth metatarsals.
2. No soft tissue abscess. Forefoot soft tissue edema and
enhancement compatible with cellulitis in the appropriate clinical
setting.
3. Negative for osteomyelitis.

## 2017-01-16 ENCOUNTER — Other Ambulatory Visit: Payer: Self-pay | Admitting: Adult Health

## 2017-01-16 DIAGNOSIS — Z1231 Encounter for screening mammogram for malignant neoplasm of breast: Secondary | ICD-10-CM

## 2017-01-17 ENCOUNTER — Ambulatory Visit (HOSPITAL_COMMUNITY)
Admission: RE | Admit: 2017-01-17 | Discharge: 2017-01-17 | Disposition: A | Discharge status: Home / Self Care | Payer: Medicaid Other | Source: Ambulatory Visit | Attending: Adult Health | Admitting: Adult Health

## 2017-01-17 DIAGNOSIS — Z1231 Encounter for screening mammogram for malignant neoplasm of breast: Secondary | ICD-10-CM | POA: Insufficient documentation

## 2017-08-12 ENCOUNTER — Telehealth: Payer: Self-pay

## 2017-08-12 NOTE — Telephone Encounter (Signed)
[phone] PATIENT RECEIVED LETTER TO SCHEDULE TCS

## 2017-08-15 ENCOUNTER — Telehealth: Payer: Self-pay

## 2017-08-15 NOTE — Telephone Encounter (Signed)
See separate triage.  

## 2017-08-19 NOTE — Telephone Encounter (Signed)
Gastroenterology Pre-Procedure Review  Request Date: 08/15/2017 Requesting Physician: Dr. Felecia ShellingFanta  PATIENT REVIEW QUESTIONS: The patient responded to the following health history questions as indicated:    1. Diabetes Melitis: YES  2. Joint replacements in the past 12 months: no 3. Major health problems in the past 3 months: no 4. Has an artificial valve or MVP: no 5. Has a defibrillator: no 6. Has been advised in past to take antibiotics in advance of a procedure like teeth cleaning: no 7. Family history of colon cancer: no  8. Alcohol Use: no 9. History of sleep apnea: no  10. History of coronary artery or other vascular stents placed within the last 12 months: no 11. History of any prior anesthesia complications: no    MEDICATIONS & ALLERGIES:    Patient reports the following regarding taking any blood thinners:   Plavix? no Aspirin? no Coumadin? no Brilinta? no Xarelto? no Eliquis? no Pradaxa? no Savaysa? no Effient? no  Patient confirms/reports the following medications:  Current Outpatient Medications  Medication Sig Dispense Refill  . acetaminophen (TYLENOL) 500 MG tablet Take 500 mg by mouth every 6 (six) hours as needed for pain.    Marland Kitchen. gabapentin (NEURONTIN) 300 MG capsule Take 300 mg by mouth at bedtime.    Marland Kitchen. glipiZIDE-metformin (METAGLIP) 5-500 MG per tablet Take 2 tablets 2 (two) times daily before a meal by mouth. Takes 2 tablets in the am and 2 in the evening    . lisinopril-hydrochlorothiazide (PRINZIDE,ZESTORETIC) 20-12.5 MG per tablet Take 1 tablet by mouth daily.    . pravastatin (PRAVACHOL) 40 MG tablet Take 40 mg by mouth every evening.    . Ascorbic Acid (VITAMIN C PO) Take 1 tablet by mouth daily. Reported on 02/03/2016    . insulin glargine (LANTUS) 100 UNIT/ML injection Inject 0.18 mLs (18 Units total) into the skin at bedtime. (Patient taking differently: Inject 30 Units at bedtime into the skin. ) 10 mL 11  . ondansetron (ZOFRAN) 4 MG tablet Take 1 tablet  (4 mg total) by mouth every 6 (six) hours. Prn nausea/vomiting (Patient not taking: Reported on 08/15/2017) 12 tablet 0   No current facility-administered medications for this visit.     Patient confirms/reports the following allergies:  No Known Allergies  No orders of the defined types were placed in this encounter.   AUTHORIZATION INFORMATION Primary Insurance:   ID #: Group #:  Pre-Cert / Auth required: Pre-Cert / Auth #:   Secondary Insurance:   ID #: Group #:  Pre-Cert / Auth required:  Pre-Cert / Auth #:   SCHEDULE INFORMATION:  Procedure has been scheduled as follows:  Date:                      Time:   Location:   This Gastroenterology Pre-Precedure Review Form is being routed to the following provider(s): Jonette EvaSandi Fields, MD

## 2017-08-19 NOTE — Telephone Encounter (Signed)
Ok to schedule.  Day before procedure: metaglip, one tablet in am and one in pm. lantus 1/2 regular dose at bedtime. AM of procedure: hold metaglip and lantus.

## 2017-08-28 ENCOUNTER — Other Ambulatory Visit: Payer: Self-pay

## 2017-08-28 DIAGNOSIS — Z1211 Encounter for screening for malignant neoplasm of colon: Secondary | ICD-10-CM

## 2017-08-28 NOTE — Telephone Encounter (Signed)
Pt is scheduled for 09/30/2017 at 9:30 AM with Dr. Darrick PennaFields.

## 2017-09-02 MED ORDER — PEG 3350-KCL-NA BICARB-NACL 420 G PO SOLR: 4000.0000 mL | ORAL | 0 refills | Status: DC

## 2017-09-02 NOTE — Telephone Encounter (Signed)
Rx sent to the pharmacy and instructions mailed to pt.  

## 2017-09-30 ENCOUNTER — Encounter (HOSPITAL_COMMUNITY): Payer: Self-pay | Admitting: Gastroenterology

## 2017-09-30 ENCOUNTER — Ambulatory Visit (HOSPITAL_COMMUNITY)
Admission: RE | Admit: 2017-09-30 | Discharge: 2017-09-30 | Disposition: A | Discharge status: Home / Self Care | Payer: Medicaid Other | Source: Ambulatory Visit | Attending: Gastroenterology | Admitting: Gastroenterology

## 2017-09-30 ENCOUNTER — Encounter (HOSPITAL_COMMUNITY)
Admission: RE | Disposition: A | Discharge status: Home / Self Care | Payer: Self-pay | Source: Ambulatory Visit | Attending: Gastroenterology

## 2017-09-30 ENCOUNTER — Other Ambulatory Visit: Payer: Self-pay

## 2017-09-30 DIAGNOSIS — K644 Residual hemorrhoidal skin tags: Secondary | ICD-10-CM | POA: Diagnosis not present

## 2017-09-30 DIAGNOSIS — F17210 Nicotine dependence, cigarettes, uncomplicated: Secondary | ICD-10-CM | POA: Diagnosis not present

## 2017-09-30 DIAGNOSIS — I10 Essential (primary) hypertension: Secondary | ICD-10-CM | POA: Diagnosis not present

## 2017-09-30 DIAGNOSIS — E7800 Pure hypercholesterolemia, unspecified: Secondary | ICD-10-CM | POA: Diagnosis not present

## 2017-09-30 DIAGNOSIS — Z79899 Other long term (current) drug therapy: Secondary | ICD-10-CM | POA: Diagnosis not present

## 2017-09-30 DIAGNOSIS — Z1212 Encounter for screening for malignant neoplasm of rectum: Secondary | ICD-10-CM

## 2017-09-30 DIAGNOSIS — E119 Type 2 diabetes mellitus without complications: Secondary | ICD-10-CM | POA: Diagnosis not present

## 2017-09-30 DIAGNOSIS — K648 Other hemorrhoids: Secondary | ICD-10-CM

## 2017-09-30 DIAGNOSIS — Z794 Long term (current) use of insulin: Secondary | ICD-10-CM | POA: Insufficient documentation

## 2017-09-30 DIAGNOSIS — Z1211 Encounter for screening for malignant neoplasm of colon: Secondary | ICD-10-CM

## 2017-09-30 HISTORY — PX: FLEXIBLE SIGMOIDOSCOPY: SHX5431

## 2017-09-30 LAB — GLUCOSE, CAPILLARY: GLUCOSE-CAPILLARY: 159 mg/dL — AB (ref 65–99)

## 2017-09-30 SURGERY — SIGMOIDOSCOPY, FLEXIBLE
Anesthesia: Moderate Sedation

## 2017-09-30 MED ORDER — SODIUM CHLORIDE 0.9 % IV SOLN
INTRAVENOUS | Status: DC
  Administered 2017-09-30: 09:00:00 via INTRAVENOUS

## 2017-09-30 MED ORDER — MIDAZOLAM HCL 5 MG/5ML IJ SOLN
INTRAMUSCULAR | Status: DC | PRN
  Administered 2017-09-30: 2 mg via INTRAVENOUS

## 2017-09-30 MED ORDER — MIDAZOLAM HCL 5 MG/5ML IJ SOLN
INTRAMUSCULAR | Status: AC
  Filled 2017-09-30: qty 10

## 2017-09-30 MED ORDER — STERILE WATER FOR IRRIGATION IR SOLN
Status: DC | PRN
  Administered 2017-09-30: 09:00:00

## 2017-09-30 MED ORDER — MEPERIDINE HCL 100 MG/ML IJ SOLN
INTRAMUSCULAR | Status: AC
  Filled 2017-09-30: qty 2

## 2017-09-30 MED ORDER — MEPERIDINE HCL 100 MG/ML IJ SOLN
INTRAMUSCULAR | Status: DC | PRN
Start: 2017-09-30 — End: 2017-09-30
  Administered 2017-09-30: 25 mg via INTRAVENOUS

## 2017-09-30 MED ORDER — MEPERIDINE HCL 100 MG/ML IJ SOLN
INTRAMUSCULAR | Status: AC
  Filled 2017-09-30: qty 1

## 2017-09-30 NOTE — Discharge Instructions (Signed)
I was able to complete a sigmoidoscopy, which looks at the last 1/3 of your colon. YOU HAVE INTERNAL AND EXTERNAL HEMORRHOIDS. YOU DID NOT HAVE ANY POLYPS.    DRINK WATER TO KEEP YOUR URINE LIGHT YELLOW.  FOLLOW A HIGH FIBER DIET. SEE INFO BELOW. AVOID ITEMS THAT CAUSE BLOATING.  NEXT COLONOSCOPY IN 5 YEARS. YOU WILL NEED ONE BEFORE 5 YEARS IF YOU BLOOD IN YOUR STOOL, HAVE PERSISTENT LOWER ABDOMINAL PAIN, OR EXPERIENCE A CHANGE IN YOUR BOWEL HABIT THAT WON'T COME AND GO.   SIGMOIDOSCOPY Care After Read the instructions outlined below and refer to this sheet in the next week. These discharge instructions provide you with general information on caring for yourself after you leave the hospital. While your treatment has been planned according to the most current medical practices available, unavoidable complications occasionally occur. If you have any problems or questions after discharge, call DR. Ladoris Lythgoe, [phone].  ACTIVITY  You may resume your regular activity, but move at a slower pace for the next 24 hours.   Take frequent rest periods for the next 24 hours.   Walking will help get rid of the air and reduce the bloated feeling in your belly (abdomen).   No driving for 24 hours (because of the medicine (anesthesia) used during the test).   You may shower.   Do not sign any important legal documents or operate any machinery for 24 hours (because of the anesthesia used during the test).    NUTRITION  Drink plenty of fluids.   You may resume your normal diet as instructed by your doctor.   Begin with a light meal and progress to your normal diet. Heavy or fried foods are harder to digest and may make you feel sick to your stomach (nauseated).   Avoid alcoholic beverages for 24 hours or as instructed.    MEDICATIONS  You may resume your normal medications.   WHAT YOU CAN EXPECT TODAY  Some feelings of bloating in the abdomen.   Passage of more gas than usual.    Spotting of blood in your stool or on the toilet paper  .  IF YOU HAD POLYPS REMOVED DURING THE SIGMOIDOSCOPY:  Eat a soft diet IF YOU HAVE NAUSEA, BLOATING, ABDOMINAL PAIN, OR VOMITING.    FINDING OUT THE RESULTS OF YOUR TEST Not all test results are available during your visit. DR. Darrick PennaFIELDS WILL CALL YOU WITHIN 7 DAYS OF YOUR PROCEDUE WITH YOUR RESULTS. Do not assume everything is normal if you have not heard from DR. Shakiya Mcneary IN ONE WEEK, CALL HER OFFICE AT [phone].  SEEK IMMEDIATE MEDICAL ATTENTION AND CALL THE OFFICE: [phone] IF:  You have more than a spotting of blood in your stool.   Your belly is swollen (abdominal distention).   You are nauseated or vomiting.   You have a temperature over 101F.   You have abdominal pain or discomfort that is severe or gets worse throughout the day.  High-Fiber Diet A high-fiber diet changes your normal diet to include more whole grains, legumes, fruits, and vegetables. Changes in the diet involve replacing refined carbohydrates with unrefined foods. The calorie level of the diet is essentially unchanged. The Dietary Reference Intake (recommended amount) for adult males is 38 grams per day. For adult females, it is 25 grams per day. Pregnant and lactating women should consume 28 grams of fiber per day. Fiber is the intact part of a plant that is not broken down during digestion. Functional fiber is fiber  that has been isolated from the plant to provide a beneficial effect in the body. PURPOSE  Increase stool bulk.   Ease and regulate bowel movements.   Lower cholesterol.   REDUCE RISK OF COLON CANCER  INDICATIONS THAT YOU NEED MORE FIBER  Constipation and hemorrhoids.   Uncomplicated diverticulosis (intestine condition) and irritable bowel syndrome.   Weight management.   As a protective measure against hardening of the arteries (atherosclerosis), diabetes, and cancer.   GUIDELINES FOR INCREASING FIBER IN THE  DIET  Start adding fiber to the diet slowly. A gradual increase of about 5 more grams (2 slices of whole-wheat bread, 2 servings of most fruits or vegetables, or 1 bowl of high-fiber cereal) per day is best. Too rapid an increase in fiber may result in constipation, flatulence, and bloating.   Drink enough water and fluids to keep your urine clear or pale yellow. Water, juice, or caffeine-free drinks are recommended. Not drinking enough fluid may cause constipation.   Eat a variety of high-fiber foods rather than one type of fiber.   Try to increase your intake of fiber through using high-fiber foods rather than fiber pills or supplements that contain small amounts of fiber.   The goal is to change the types of food eaten. Do not supplement your present diet with high-fiber foods, but replace foods in your present diet.   INCLUDE A VARIETY OF FIBER SOURCES  Replace refined and processed grains with whole grains, canned fruits with fresh fruits, and incorporate other fiber sources. White rice, white breads, and most bakery goods contain little or no fiber.   Brown whole-grain rice, buckwheat oats, and many fruits and vegetables are all good sources of fiber. These include: broccoli, Brussels sprouts, cabbage, cauliflower, beets, sweet potatoes, white potatoes (skin on), carrots, tomatoes, eggplant, squash, berries, fresh fruits, and dried fruits.   Cereals appear to be the richest source of fiber. Cereal fiber is found in whole grains and bran. Bran is the fiber-rich outer coat of cereal grain, which is largely removed in refining. In whole-grain cereals, the bran remains. In breakfast cereals, the largest amount of fiber is found in those with "bran" in their names. The fiber content is sometimes indicated on the label.   You may need to include additional fruits and vegetables each day.   In baking, for 1 cup white flour, you may use the following substitutions:   1 cup whole-wheat flour  minus 2 tablespoons.   1/2 cup white flour plus 1/2 cup whole-wheat flour.   Hemorrhoids Hemorrhoids are dilated (enlarged) veins around the rectum. Sometimes clots will form in the veins. This makes them swollen and painful. These are called thrombosed hemorrhoids. Causes of hemorrhoids include:  Constipation.   Straining to have a bowel movement.   HEAVY LIFTING  HOME CARE INSTRUCTIONS  Eat a well balanced diet and drink 6 to 8 glasses of water every day to avoid constipation. You may also use a bulk laxative.   Avoid straining to have bowel movements.   Keep anal area dry and clean.   Do not use a donut shaped pillow or sit on the toilet for long periods. This increases blood pooling and pain.   Move your bowels when your body has the urge; this will require less straining and will decrease pain and pressure.

## 2017-09-30 NOTE — H&P (Signed)
Primary Care Physician:  Avon GullyFanta, Tesfaye, MD Primary Gastroenterologist:  Dr. Darrick PennaFields  Pre-Procedure History & Physical: HPI:  [name] is a 56 y.o. female here for COLON CANCER SCREENING.  Past Medical History:  Diagnosis Date  . Diabetes mellitus without complication (HCC)   . Hidradenitis 12/13/2015  . Hypercholesterolemia   . Hypertension   . Smoker 12/13/2015    History reviewed. No pertinent surgical history.  Prior to Admission medications   Medication Sig Start Date End Date Taking? Authorizing Provider  acetaminophen (TYLENOL) 325 MG tablet Take 325 mg by mouth 2 (two) times daily as needed for moderate pain or headache.   Yes [provider]  gabapentin (NEURONTIN) 300 MG capsule Take 300 mg by mouth at bedtime.   Yes [provider]  glipiZIDE-metformin (METAGLIP) 5-500 MG per tablet Take 2 tablets by mouth 2 (two) times daily.    Yes [provider]  insulin glargine (LANTUS) 100 UNIT/ML injection Inject 0.18 mLs (18 Units total) into the skin at bedtime. Patient taking differently: Inject 30 Units at bedtime into the skin.  04/05/15  Yes Pucilowska, Jolanta B, MD  lisinopril-hydrochlorothiazide (PRINZIDE,ZESTORETIC) 10-12.5 MG tablet Take 1 tablet by mouth daily.   Yes [provider]  polyethylene glycol-electrolytes (TRILYTE) 420 g solution Take 4,000 mLs by mouth as directed. 09/02/17  Yes Jerita Wimbush L, MD  pravastatin (PRAVACHOL) 40 MG tablet Take 40 mg by mouth every evening.   Yes [provider]    Allergies as of 08/28/2017  . (No Known Allergies)    Family History  Problem Relation Age of Onset  . Asthma Mother   . Cancer Mother   . Hypertension Mother   . Diabetes Mother   . Hypertension Father   . Diabetes Father   . Stroke Brother   . Hypertension Maternal Grandmother   . Diabetes Maternal Grandmother   . Hyperlipidemia Maternal Grandmother   . Hypertension Maternal Grandfather   .  Hyperlipidemia Maternal Grandfather   . Hypertension Paternal Grandmother   . Hyperlipidemia Paternal Grandmother   . Hypertension Paternal Grandfather   . Hyperlipidemia Paternal Grandfather     Social History   Socioeconomic History  . Marital status: Legally Separated    Spouse name: Not on file  . Number of children: Not on file  . Years of education: Not on file  . Highest education level: Not on file  Social Needs  . Financial resource strain: Not on file  . Food insecurity - worry: Not on file  . Food insecurity - inability: Not on file  . Transportation needs - medical: Not on file  . Transportation needs - non-medical: Not on file  Occupational History  . Not on file  Tobacco Use  . Smoking status: Current Every Day Smoker    Packs/day: 0.50    Years: 34.00    Pack years: 17.00    Types: Cigarettes  . Smokeless tobacco: Never Used  Substance and Sexual Activity  . Alcohol use: No  . Drug use: No  . Sexual activity: Not Currently    Birth control/protection: Post-menopausal  Other Topics Concern  . Not on file  Social History Narrative  . Not on file    Review of Systems: See HPI, otherwise negative ROS   Physical Exam: BP 125/79   Pulse 92   Temp 98.8 F (37.1 C) (Oral)   Ht 5\' 5"  (1.651 m)   Wt 170 lb (77.1 kg)   SpO2 96%  BMI 28.29 kg/m  General:   Alert,  pleasant and cooperative in NAD Head:  Normocephalic and atraumatic. Neck:  Supple; Lungs:  Clear throughout to auscultation.    Heart:  Regular rate and rhythm. Abdomen:  Soft, nontender and nondistended. Normal bowel sounds, without guarding, and without rebound.   Neurologic:  Alert and  oriented x4;  grossly normal neurologically.  Impression/Plan:     SCREENING  Plan:  1. Flex sig/possible TCS TODAY. DISCUSSED PROCEDURE, BENEFITS, & RISKS: < 1% chance of medication reaction, bleeding, perforation, or rupture of spleen/liver.

## 2017-09-30 NOTE — Op Note (Signed)
[facility] Patient Name: Alyssa ButtsBarbara Rivera Procedure Date: 09/30/2017 9:30 AM MRN: [id number] Date of Birth: Mar 18, 1961 Attending MD: Jonette EvaSandi Lealand Elting MD, MD CSN: [id number] Age: [age] Admit Type: Outpatient Procedure:                Flexible Sigmoidoscopy, SCREENING Indications:              Screening for malignant neoplasm in the colon.                            CHANGED TO SIGMOIDOSCOPY BECAUSE PT ONLY HAS AN                            AIDE FOR 2 HRS TODAY AND DOESN'T KNOW SON'S PHONE                            NUMBER. Providers:                Jonette EvaSandi Isam Unrein MD, MD Referring MD:             Avon Gullyesfaye Fanta MD, MD Medicines:                Meperidine 25 mg IV, Midazolam 2 mg IV Complications:            No immediate complications. Estimated Blood Loss:     Estimated blood loss: none. Procedure:                Pre-Anesthesia Assessment:                           - Prior to the procedure, a History and Physical                            was performed, and patient medications and                            allergies were reviewed. The patient's tolerance of                            previous anesthesia was also reviewed. The risks                            and benefits of the procedure and the sedation                            options and risks were discussed with the patient.                            All questions were answered, and informed consent                            was obtained. Prior Anticoagulants: The patient has                            taken no previous anticoagulant or antiplatelet  agents. ASA Grade Assessment: II - A patient with                            mild systemic disease. After reviewing the risks                            and benefits, the patient was deemed in                            satisfactory condition to undergo the procedure.                           After obtaining informed consent, the scope was           passed under direct vision. The [id number] ([id number])                            scope was introduced through the anus and advanced                            to the the splenic flexure. The flexible                            sigmoidoscopy was accomplished without difficulty.                            The patient tolerated the procedure well. The                            quality of the bowel preparation was good. Findings:      External and internal hemorrhoids were found during retroflexion. The       hemorrhoids were moderate.      The exam was otherwise without abnormality. Impression:               - MODERATE External and internal hemorrhoids. Moderate Sedation:      Moderate (conscious) sedation was administered by the endoscopy nurse       and supervised by the endoscopist. The following parameters were       monitored: oxygen saturation, heart rate, blood pressure, and response       to care. Total physician intraservice time was 20 minutes. Recommendation:           - Continue present medications.                           - High fiber diet.                           - Repeat flexible sigmoidoscopy OR COLONOSCOPY in 5                            years for surveillance. NEEDS CLEAR LIQUID DIET                            WITHOUT OIL DROPLETS IN BROTH PRIOR TO NEXT LOWER  ENDOSCOPY.                           - Patient has a contact number available for                            emergencies. The signs and symptoms of potential                            delayed complications were discussed with the                            patient. Return to normal activities tomorrow.                            Written discharge instructions were provided to the                            patient. Procedure Code(s):        --- Professional ---                           [phone], Sigmoidoscopy, flexible; diagnostic,                            including collection  of specimen(s) by brushing or                            washing, when performed (separate procedure)                           99152, Moderate sedation services provided by the                            same physician or other qualified health care                            professional performing the diagnostic or                            therapeutic service that the sedation supports,                            requiring the presence of an independent trained                            observer to assist in the monitoring of the                            patient's level of consciousness and physiological                            status; initial 15 minutes of intraservice time,  patient age 32 years or older Diagnosis Code(s):        --- Professional ---                           Z12.11, Encounter for screening for malignant                            neoplasm of colon                           K64.8, Other hemorrhoids CPT copyright 2016 [employer]. All rights reserved. The codes documented in this report are preliminary and upon coder review may  be revised to meet current compliance requirements. Jonette Eva, MD Jonette Eva MD, MD 09/30/2017 9:44:28 AM This report has been signed electronically. Number of Addenda: 0

## 2017-10-03 ENCOUNTER — Encounter (HOSPITAL_COMMUNITY): Payer: Self-pay | Admitting: Gastroenterology

## 2017-10-31 ENCOUNTER — Other Ambulatory Visit: Payer: Self-pay

## 2017-10-31 ENCOUNTER — Emergency Department (HOSPITAL_COMMUNITY)
Admission: EM | Admit: 2017-10-31 | Discharge: 2017-10-31 | Disposition: A | Discharge status: Home / Self Care | Payer: Medicaid Other | Attending: Emergency Medicine | Admitting: Emergency Medicine

## 2017-10-31 ENCOUNTER — Encounter (HOSPITAL_COMMUNITY): Payer: Self-pay

## 2017-10-31 ENCOUNTER — Emergency Department (HOSPITAL_COMMUNITY): Payer: Medicaid Other

## 2017-10-31 DIAGNOSIS — J069 Acute upper respiratory infection, unspecified: Secondary | ICD-10-CM | POA: Diagnosis not present

## 2017-10-31 DIAGNOSIS — Z7902 Long term (current) use of antithrombotics/antiplatelets: Secondary | ICD-10-CM | POA: Insufficient documentation

## 2017-10-31 DIAGNOSIS — I10 Essential (primary) hypertension: Secondary | ICD-10-CM | POA: Diagnosis not present

## 2017-10-31 DIAGNOSIS — Z794 Long term (current) use of insulin: Secondary | ICD-10-CM | POA: Diagnosis not present

## 2017-10-31 DIAGNOSIS — E119 Type 2 diabetes mellitus without complications: Secondary | ICD-10-CM | POA: Insufficient documentation

## 2017-10-31 DIAGNOSIS — F17210 Nicotine dependence, cigarettes, uncomplicated: Secondary | ICD-10-CM | POA: Diagnosis not present

## 2017-10-31 DIAGNOSIS — Z79899 Other long term (current) drug therapy: Secondary | ICD-10-CM | POA: Insufficient documentation

## 2017-10-31 DIAGNOSIS — B9789 Other viral agents as the cause of diseases classified elsewhere: Secondary | ICD-10-CM | POA: Insufficient documentation

## 2017-10-31 DIAGNOSIS — R05 Cough: Secondary | ICD-10-CM | POA: Diagnosis present

## 2017-10-31 MED ORDER — PROMETHAZINE-DM 6.25-15 MG/5ML PO SYRP: 5.0000 mL | ORAL_SOLUTION | Freq: Four times a day (QID) | ORAL | 0 refills | Status: DC | PRN

## 2017-10-31 MED ORDER — IPRATROPIUM-ALBUTEROL 0.5-2.5 (3) MG/3ML IN SOLN
3.0000 mL | Freq: Once | RESPIRATORY_TRACT | Status: AC
  Administered 2017-10-31: 3 mL via RESPIRATORY_TRACT
  Filled 2017-10-31: qty 3

## 2017-10-31 NOTE — ED Notes (Signed)
Pt transported to xray 

## 2017-10-31 NOTE — ED Triage Notes (Addendum)
Pt c/o productive cough, sore throat, and congestion x 2 weeks. States she has been taking Benadryl and Nyquil with no relief.

## 2017-10-31 NOTE — ED Provider Notes (Signed)
[facility] EMERGENCY DEPARTMENT Provider Note   CSN: [id number] Arrival date & time: 10/31/17  1342     History   Chief Complaint Chief Complaint  Patient presents with  . Cough    HPI [name] is a 57 y.o. female with past medical history significant for diabetes, hypertension, bipolar disorder, current every day smoker presenting with 2 weeks of cough, congestion, sore throat.  Has tried NyQuil and Benadryl without relief.  She reports chest tightness only when she coughs.  Patient reports being up-to-date on her flu vaccination.  Denies fever, chills, myalgias, nausea, vomiting.  HPI  Past Medical History:  Diagnosis Date  . Diabetes mellitus without complication (HCC)   . Hidradenitis 12/13/2015  . Hypercholesterolemia   . Hypertension   . Smoker 12/13/2015    Patient Active Problem List   Diagnosis Date Noted  . Special screening for malignant neoplasms, colon   . Hidradenitis 12/13/2015  . Smoker 12/13/2015  . Bipolar I disorder, most recent episode (or current) manic (HCC) 04/01/2015  . Unspecified constipation 06/26/2013  . Nausea and vomiting 06/25/2013  . Cellulitis and abscess of foot 06/23/2013  . DM (diabetes mellitus) (HCC) 06/23/2013  . HTN (hypertension) 06/23/2013  . Tobacco abuse 06/23/2013  . Hyperlipidemia 06/23/2013    Past Surgical History:  Procedure Laterality Date  . FLEXIBLE SIGMOIDOSCOPY N/A 09/30/2017   Procedure: FLEXIBLE SIGMOIDOSCOPY;  Surgeon: West Bali, MD;  [facility]: [facility];  Service: Endoscopy;  Laterality: N/A;    OB History    Gravida Para Term Preterm AB Living   1 1           SAB TAB Ectopic Multiple Live Births                   Home Medications    Prior to Admission medications   Medication Sig Start Date End Date Taking? Authorizing Provider  acetaminophen (TYLENOL) 325 MG tablet Take 325 mg by mouth 2 (two) times daily as needed for moderate pain or headache.    [provider]    gabapentin (NEURONTIN) 300 MG capsule Take 300 mg by mouth at bedtime.    [provider]  glipiZIDE-metformin (METAGLIP) 5-500 MG per tablet Take 2 tablets by mouth 2 (two) times daily.     [provider]  insulin glargine (LANTUS) 100 UNIT/ML injection Inject 0.18 mLs (18 Units total) into the skin at bedtime. Patient taking differently: Inject 30 Units at bedtime into the skin.  04/05/15   Pucilowska, Jolanta B, MD  lisinopril-hydrochlorothiazide (PRINZIDE,ZESTORETIC) 10-12.5 MG tablet Take 1 tablet by mouth daily.    [provider]  pravastatin (PRAVACHOL) 40 MG tablet Take 40 mg by mouth every evening.    [provider]  promethazine-dextromethorphan (PROMETHAZINE-DM) 6.25-15 MG/5ML syrup Take 5 mLs by mouth 4 (four) times daily as needed for cough. 10/31/17   Georgiana Shore, PA-C    Family History Family History  Problem Relation Age of Onset  . Asthma Mother   . Cancer Mother   . Hypertension Mother   . Diabetes Mother   . Hypertension Father   . Diabetes Father   . Stroke Brother   . Hypertension Maternal Grandmother   . Diabetes Maternal Grandmother   . Hyperlipidemia Maternal Grandmother   . Hypertension Maternal Grandfather   . Hyperlipidemia Maternal Grandfather   . Hypertension Paternal Grandmother   . Hyperlipidemia Paternal Grandmother   . Hypertension Paternal Grandfather   . Hyperlipidemia Paternal  Grandfather     Social History Social History   Tobacco Use  . Smoking status: Current Every Day Smoker    Packs/day: 0.50    Years: 34.00    Pack years: 17.00    Types: Cigarettes  . Smokeless tobacco: Never Used  Substance Use Topics  . Alcohol use: No  . Drug use: No     Allergies   Patient has no known allergies.   Review of Systems Review of Systems  Constitutional: Negative for chills, diaphoresis, fatigue and fever.  HENT: Positive for congestion, ear pain and sore throat. Negative for ear discharge,  sinus pressure, sinus pain, tinnitus, trouble swallowing and voice change.   Eyes: Negative for photophobia, pain, redness and visual disturbance.  Respiratory: Positive for cough and chest tightness. Negative for choking, shortness of breath, wheezing and stridor.   Cardiovascular: Negative for chest pain, palpitations and leg swelling.  Gastrointestinal: Negative for abdominal pain, nausea and vomiting.  Genitourinary: Negative for difficulty urinating, dysuria, flank pain and frequency.  Musculoskeletal: Negative for arthralgias, back pain, myalgias, neck pain and neck stiffness.  Skin: Negative for color change, pallor and rash.  Neurological: Negative for dizziness, weakness, light-headedness and headaches.     Physical Exam Updated Vital Signs BP (!) 128/51 (BP Location: Right Arm)   Pulse 90   Temp 97.7 F (36.5 C) (Oral)   Resp 16   Wt 72.6 kg (160 lb)   SpO2 93%   BMI 26.63 kg/m   Physical Exam  Constitutional: She appears well-developed and well-nourished. No distress.  Afebrile, nontoxic-appearing, sitting comfortably in bed no acute distress.  HENT:  Head: Normocephalic and atraumatic.  Mouth/Throat: Oropharynx is clear and moist. No oropharyngeal exudate.  Eyes: Conjunctivae are normal. Right eye exhibits no discharge. Left eye exhibits no discharge.  Neck: Normal range of motion. Neck supple.  Cardiovascular: Normal rate, regular rhythm and normal heart sounds.  No murmur heard. Pulmonary/Chest: Effort normal. No stridor. No respiratory distress. She has wheezes. She has no rales. She exhibits no tenderness.  Abdominal: She exhibits no distension.  Musculoskeletal: Normal range of motion. She exhibits no edema.  Lymphadenopathy:    She has no cervical adenopathy.  Neurological: She is alert.  Skin: Skin is warm and dry. No rash noted. She is not diaphoretic. No erythema. No pallor.  Psychiatric: She has a normal mood and affect.  Nursing note and vitals  reviewed.    ED Treatments / Results  Labs (all labs ordered are listed, but only abnormal results are displayed) Labs Reviewed - No data to display  EKG  EKG Interpretation None       Radiology Dg Chest 2 View  Result Date: 10/31/2017 CLINICAL DATA:  Productive cough for 2 weeks. EXAM: CHEST  2 VIEW COMPARISON:  None. FINDINGS: The heart size and mediastinal contours are within normal limits. Both lungs are clear. The visualized skeletal structures are unremarkable. IMPRESSION: No active cardiopulmonary disease. Electronically Signed   By: Gerome Sam III M.D   On: 10/31/2017 15:59    Procedures Procedures (including critical care time)  Medications Ordered in ED Medications  ipratropium-albuterol (DUONEB) 0.5-2.5 (3) MG/3ML nebulizer solution 3 mL (3 mLs Nebulization Given 10/31/17 1619)     Initial Impression / Assessment and Plan / ED Course  I have reviewed the triage vital signs and the nursing notes.  Pertinent labs & imaging results that were available during my care of the patient were reviewed by me and considered in my medical  decision making (see chart for details).       Patient presenting with 2 weeks of cough and upper respiratory infection symptoms.  Some wheezing on exam, otherwise well-appearing, afebrile nontoxic in the emergency department.  Pt CXR negative for acute infiltrate. Patients symptoms are consistent with URI, likely viral etiology. Discussed that antibiotics are not indicated for viral infections. Pt will be discharged with symptomatic treatment.  Verbalizes understanding and is agreeable with plan. Pt is hemodynamically stable & in NAD prior to dc.   She was given nebulizing treatment and reported significant improvement thereafter.  Has an inhaler at home and was told to use it as needed.  Lungs are clear to auscultation bilaterally after nebs.  Discharge home with symptomatic relief and close follow-up with PCP.  Discussed strict  return precautions and advised to return to the emergency department if experiencing any new or worsening symptoms. Instructions were understood and patient agreed with discharge plan.  Final Clinical Impressions(s) / ED Diagnoses   Final diagnoses:  Viral URI with cough    ED Discharge Orders        Ordered    promethazine-dextromethorphan (PROMETHAZINE-DM) 6.25-15 MG/5ML syrup  4 times daily PRN     10/31/17 1710       Gregary CromerMitchell, Kamuela Magos B, PA-C 10/31/17 1835    Terrilee FilesButler, Michael C, MD 10/31/17 [phone]

## 2017-10-31 NOTE — Discharge Instructions (Signed)
As discussed, use your inhaler as needed. Cough medicine as needed. Do not drive or operate machinery while taking this medication as it may make you drowsy.  Follow-up with your primary care provider at your upcoming appointment. Return sooner if symptoms worsen, difficulty breathing, shortness of breath, chest pressure, fever, pain on respiration or other new concerning symptoms in the meantime.

## 2017-10-31 NOTE — ED Notes (Signed)
RT at bedside.

## 2017-11-14 ENCOUNTER — Emergency Department (HOSPITAL_COMMUNITY)
Admission: EM | Admit: 2017-11-14 | Discharge: 2017-11-14 | Disposition: A | Discharge status: Home / Self Care | Payer: Medicaid Other | Attending: Emergency Medicine | Admitting: Emergency Medicine

## 2017-11-14 ENCOUNTER — Encounter (HOSPITAL_COMMUNITY): Payer: Self-pay

## 2017-11-14 ENCOUNTER — Emergency Department (HOSPITAL_COMMUNITY): Payer: Medicaid Other

## 2017-11-14 DIAGNOSIS — J069 Acute upper respiratory infection, unspecified: Secondary | ICD-10-CM

## 2017-11-14 DIAGNOSIS — I10 Essential (primary) hypertension: Secondary | ICD-10-CM | POA: Diagnosis not present

## 2017-11-14 DIAGNOSIS — E119 Type 2 diabetes mellitus without complications: Secondary | ICD-10-CM | POA: Diagnosis not present

## 2017-11-14 DIAGNOSIS — Z79899 Other long term (current) drug therapy: Secondary | ICD-10-CM | POA: Insufficient documentation

## 2017-11-14 DIAGNOSIS — Z7902 Long term (current) use of antithrombotics/antiplatelets: Secondary | ICD-10-CM | POA: Diagnosis not present

## 2017-11-14 DIAGNOSIS — E871 Hypo-osmolality and hyponatremia: Secondary | ICD-10-CM

## 2017-11-14 DIAGNOSIS — R739 Hyperglycemia, unspecified: Secondary | ICD-10-CM | POA: Diagnosis present

## 2017-11-14 DIAGNOSIS — F17210 Nicotine dependence, cigarettes, uncomplicated: Secondary | ICD-10-CM | POA: Insufficient documentation

## 2017-11-14 DIAGNOSIS — Z794 Long term (current) use of insulin: Secondary | ICD-10-CM | POA: Insufficient documentation

## 2017-11-14 LAB — CBC WITH DIFFERENTIAL/PLATELET
BASOS ABS: 0 10*3/uL (ref 0.0–0.1)
BASOS PCT: 0 %
EOS ABS: 0.6 10*3/uL (ref 0.0–0.7)
EOS PCT: 4 %
HCT: 43.7 % (ref 36.0–46.0)
HEMOGLOBIN: 14.9 g/dL (ref 12.0–15.0)
LYMPHS ABS: 3.1 10*3/uL (ref 0.7–4.0)
Lymphocytes Relative: 21 %
MCH: 30.7 pg (ref 26.0–34.0)
MCHC: 34.1 g/dL (ref 30.0–36.0)
MCV: 89.9 fL (ref 78.0–100.0)
Monocytes Absolute: 0.8 10*3/uL (ref 0.1–1.0)
Monocytes Relative: 5 %
Neutro Abs: 10.5 10*3/uL — ABNORMAL HIGH (ref 1.7–7.7)
Neutrophils Relative %: 70 %
PLATELETS: 306 10*3/uL (ref 150–400)
RBC: 4.86 MIL/uL (ref 3.87–5.11)
RDW: 11.6 % (ref 11.5–15.5)
WBC: 15 10*3/uL — AB (ref 4.0–10.5)

## 2017-11-14 LAB — URINALYSIS, ROUTINE W REFLEX MICROSCOPIC
Bilirubin Urine: NEGATIVE
Glucose, UA: 50 mg/dL — AB
Hgb urine dipstick: NEGATIVE
KETONES UR: NEGATIVE mg/dL
Nitrite: NEGATIVE
PH: 6 (ref 5.0–8.0)
Protein, ur: NEGATIVE mg/dL
Specific Gravity, Urine: 1.008 (ref 1.005–1.030)

## 2017-11-14 LAB — BASIC METABOLIC PANEL
Anion gap: 13 (ref 5–15)
BUN: 14 mg/dL (ref 6–20)
CALCIUM: 9.6 mg/dL (ref 8.9–10.3)
CHLORIDE: 89 mmol/L — AB (ref 101–111)
CO2: 26 mmol/L (ref 22–32)
Creatinine, Ser: 0.84 mg/dL (ref 0.44–1.00)
Glucose, Bld: 162 mg/dL — ABNORMAL HIGH (ref 65–99)
Potassium: 3.9 mmol/L (ref 3.5–5.1)
SODIUM: 128 mmol/L — AB (ref 135–145)

## 2017-11-14 LAB — CBG MONITORING, ED: GLUCOSE-CAPILLARY: 180 mg/dL — AB (ref 65–99)

## 2017-11-14 MED ORDER — IPRATROPIUM-ALBUTEROL 0.5-2.5 (3) MG/3ML IN SOLN: 3.0000 mL | Freq: Once | RESPIRATORY_TRACT | Status: DC

## 2017-11-14 MED ORDER — AZITHROMYCIN 250 MG PO TABS: 250.0000 mg | ORAL_TABLET | Freq: Every day | ORAL | 0 refills | Status: DC

## 2017-11-14 NOTE — Discharge Instructions (Signed)
Continue using your inhaler as needed.  You are not getting over this because of the damage cigarette smoking is doing to your lungs.  If you stop smoking, you will find that you get over these infections much faster.

## 2017-11-14 NOTE — ED Notes (Signed)
Unable to locate pt in waiting areas 

## 2017-11-14 NOTE — ED Notes (Signed)
Pt returned from X Ray.

## 2017-11-14 NOTE — ED Notes (Signed)
Patient transported to X-ray 

## 2017-11-14 NOTE — ED Triage Notes (Signed)
Pt arrives via ems for high blood sugar (cbg 281 per ems)  Pt also c/o rash under both armpits.

## 2017-11-14 NOTE — ED Provider Notes (Signed)
[facility] EMERGENCY DEPARTMENT Provider Note   CSN: [id number] Arrival date & time: 11/14/17  0126     History   Chief Complaint Chief Complaint  Patient presents with  . Hyperglycemia    281 cbg    HPI [name] is a 57 y.o. female.  The history is provided by the patient.  Hyperglycemia  She has history of hypertension, diabetes, hyperlipidemia, bipolar disorder and is a cigarette smoker.  She comes in with ongoing problems with cough productive of some yellow sputum.  She states she was seen in emergency department 2 weeks ago for these complaints and has not gotten any better.  She denies fever, chills, sweats.  She denies dyspnea.  She denies nausea or vomiting or diarrhea.  She has had surgery on both of her axillae and is worried that she has developed a rash there.  She has continued to smoke.  Past Medical History:  Diagnosis Date  . Diabetes mellitus without complication (HCC)   . Hidradenitis 12/13/2015  . Hypercholesterolemia   . Hypertension   . Smoker 12/13/2015    Patient Active Problem List   Diagnosis Date Noted  . Special screening for malignant neoplasms, colon   . Hidradenitis 12/13/2015  . Smoker 12/13/2015  . Bipolar I disorder, most recent episode (or current) manic (HCC) 04/01/2015  . Unspecified constipation 06/26/2013  . Nausea and vomiting 06/25/2013  . Cellulitis and abscess of foot 06/23/2013  . DM (diabetes mellitus) (HCC) 06/23/2013  . HTN (hypertension) 06/23/2013  . Tobacco abuse 06/23/2013  . Hyperlipidemia 06/23/2013    Past Surgical History:  Procedure Laterality Date  . FLEXIBLE SIGMOIDOSCOPY N/A 09/30/2017   Procedure: FLEXIBLE SIGMOIDOSCOPY;  Surgeon: West BaliFields, Sandi L, MD;  Location: [facility];  Service: Endoscopy;  Laterality: N/A;    OB History    Gravida Para Term Preterm AB Living   1 1           SAB TAB Ectopic Multiple Live Births                   Home Medications    Prior to Admission  medications   Medication Sig Start Date End Date Taking? Authorizing Provider  acetaminophen (TYLENOL) 325 MG tablet Take 325 mg by mouth 2 (two) times daily as needed for moderate pain or headache.    [provider]  gabapentin (NEURONTIN) 300 MG capsule Take 300 mg by mouth at bedtime.    [provider]  glipiZIDE-metformin (METAGLIP) 5-500 MG per tablet Take 2 tablets by mouth 2 (two) times daily.     [provider]  insulin glargine (LANTUS) 100 UNIT/ML injection Inject 0.18 mLs (18 Units total) into the skin at bedtime. Patient taking differently: Inject 30 Units at bedtime into the skin.  04/05/15   Pucilowska, Jolanta B, MD  lisinopril-hydrochlorothiazide (PRINZIDE,ZESTORETIC) 10-12.5 MG tablet Take 1 tablet by mouth daily.    [provider]  pravastatin (PRAVACHOL) 40 MG tablet Take 40 mg by mouth every evening.    [provider]  promethazine-dextromethorphan (PROMETHAZINE-DM) 6.25-15 MG/5ML syrup Take 5 mLs by mouth 4 (four) times daily as needed for cough. 10/31/17   Georgiana ShoreMitchell, Jessica B, PA-C    Family History Family History  Problem Relation Age of Onset  . Asthma Mother   . Cancer Mother   . Hypertension Mother   . Diabetes Mother   . Hypertension Father   . Diabetes Father   . Stroke Brother   .  Hypertension Maternal Grandmother   . Diabetes Maternal Grandmother   . Hyperlipidemia Maternal Grandmother   . Hypertension Maternal Grandfather   . Hyperlipidemia Maternal Grandfather   . Hypertension Paternal Grandmother   . Hyperlipidemia Paternal Grandmother   . Hypertension Paternal Grandfather   . Hyperlipidemia Paternal Grandfather     Social History Social History   Tobacco Use  . Smoking status: Current Every Day Smoker    Packs/day: 0.50    Years: 34.00    Pack years: 17.00    Types: Cigarettes  . Smokeless tobacco: Never Used  Substance Use Topics  . Alcohol use: No  . Drug use: No     Allergies     Patient has no known allergies.   Review of Systems Review of Systems  All other systems reviewed and are negative.    Physical Exam Updated Vital Signs BP 103/65   Pulse 85   Temp 98.6 F (37 C) (Oral)   Resp 18   Ht 5\' 6"  (1.676 m)   Wt 74.8 kg (165 lb)   SpO2 95%   BMI 26.63 kg/m   Physical Exam  Nursing note and vitals reviewed.  57 year old female, resting comfortably and in no acute distress. Vital signs are normal. Oxygen saturation is 95%, which is normal. Head is normocephalic and atraumatic. PERRLA, EOMI. Oropharynx is clear. Neck is nontender and supple without adenopathy or JVD. Back is nontender and there is no CVA tenderness. Lungs have coarse inspiratory and expiratory rhonchi without rales or wheezes. Chest is nontender. Heart has regular rate and rhythm without murmur. Abdomen is soft, flat, nontender without masses or hepatosplenomegaly and peristalsis is normoactive. Extremities have no cyanosis or edema, full range of motion is present. Skin is warm and dry without rash.  Surgical scars present in both axillae without any active rash. Neurologic: Mental status is normal, cranial nerves are intact, there are no motor or sensory deficits.  ED Treatments / Results  Labs (all labs ordered are listed, but only abnormal results are displayed) Labs Reviewed  URINALYSIS, ROUTINE W REFLEX MICROSCOPIC - Abnormal; Notable for the following components:      Result Value   Glucose, UA 50 (*)    Leukocytes, UA MODERATE (*)    Bacteria, UA RARE (*)    Squamous Epithelial / LPF 0-5 (*)    All other components within normal limits  BASIC METABOLIC PANEL - Abnormal; Notable for the following components:   Sodium 128 (*)    Chloride 89 (*)    Glucose, Bld 162 (*)    All other components within normal limits  CBC WITH DIFFERENTIAL/PLATELET - Abnormal; Notable for the following components:   WBC 15.0 (*)    Neutro Abs 10.5 (*)    All other components within  normal limits  CBG MONITORING, ED - Abnormal; Notable for the following components:   Glucose-Capillary 180 (*)    All other components within normal limits    Radiology Dg Chest 2 View  Result Date: 11/14/2017 CLINICAL DATA:  Hyperglycemia, axillary rash. Recent pneumonia. Shortness of breath. Smoker. EXAM: CHEST  2 VIEW COMPARISON:  Chest radiograph October 31, 2016 FINDINGS: Cardiomediastinal silhouette is normal. No pleural effusions or focal consolidations. Trachea projects midline and there is no pneumothorax. Soft tissue planes and included osseous structures are non-suspicious. IMPRESSION: No active cardiopulmonary disease. Electronically Signed   By: Awilda Metro M.D.   On: 11/14/2017 05:15    Procedures Procedures (including critical care time)  Medications Ordered in ED Medications  ipratropium-albuterol (DUONEB) 0.5-2.5 (3) MG/3ML nebulizer solution 3 mL (not administered)     Initial Impression / Assessment and Plan / ED Course  I have reviewed the triage vital signs and the nursing notes.  Pertinent labs & imaging results that were available during my care of the patient were reviewed by me and considered in my medical decision making (see chart for details).  Respiratory tract infection which clinically seems to be a persistent viral URI.  Will check shows to rule out pneumonia.  Old records are reviewed confirming ED visit on January 31 for viral URI with cough, told to use albuterol inhaler at home, and given prescription for promethazine-dextromethorphan syrup.  Chest x-ray shows no evidence of pneumonia.  WBC is normal.  She is noted to have mild hyponatremia which is not clinically significant.  She was given albuterol with ipratropium with some subjective improvement.  However, because of persistence of symptoms, will give a course of antibiotics and she is given a prescription for azithromycin.  Advised to stop smoking.  Final Clinical Impressions(s) / ED  Diagnoses   Final diagnoses:  URI with cough and congestion  Hyponatremia    ED Discharge Orders        Ordered    azithromycin (ZITHROMAX) 250 MG tablet  Daily     11/14/17 0550       Dione Booze, MD 11/14/17 [phone]

## 2017-12-09 ENCOUNTER — Other Ambulatory Visit (HOSPITAL_COMMUNITY): Payer: Self-pay | Admitting: Internal Medicine

## 2017-12-09 DIAGNOSIS — Z1231 Encounter for screening mammogram for malignant neoplasm of breast: Secondary | ICD-10-CM

## 2018-01-16 ENCOUNTER — Encounter: Payer: Self-pay | Admitting: Adult Health

## 2018-01-16 ENCOUNTER — Encounter (INDEPENDENT_AMBULATORY_CARE_PROVIDER_SITE_OTHER): Payer: Self-pay

## 2018-01-16 ENCOUNTER — Ambulatory Visit: Payer: Medicaid Other | Admitting: Adult Health

## 2018-01-16 VITALS — BP 140/80 | HR 96 | Ht 65.0 in | Wt 163.0 lb

## 2018-01-16 DIAGNOSIS — L732 Hidradenitis suppurativa: Secondary | ICD-10-CM | POA: Diagnosis not present

## 2018-01-16 DIAGNOSIS — Z1211 Encounter for screening for malignant neoplasm of colon: Secondary | ICD-10-CM | POA: Diagnosis not present

## 2018-01-16 DIAGNOSIS — Z01419 Encounter for gynecological examination (general) (routine) without abnormal findings: Secondary | ICD-10-CM

## 2018-01-16 DIAGNOSIS — Z1212 Encounter for screening for malignant neoplasm of rectum: Secondary | ICD-10-CM | POA: Diagnosis not present

## 2018-01-16 DIAGNOSIS — F17210 Nicotine dependence, cigarettes, uncomplicated: Secondary | ICD-10-CM

## 2018-01-16 DIAGNOSIS — F17200 Nicotine dependence, unspecified, uncomplicated: Secondary | ICD-10-CM

## 2018-01-16 LAB — HEMOCCULT GUIAC POC 1CARD (OFFICE): Fecal Occult Blood, POC: NEGATIVE

## 2018-01-16 MED ORDER — TRIAMCINOLONE ACETONIDE 0.5 % EX OINT: 1.0000 "application " | TOPICAL_OINTMENT | Freq: Two times a day (BID) | CUTANEOUS | 1 refills | Status: DC

## 2018-01-16 NOTE — Progress Notes (Signed)
Patient ID: [name], female   DOB: 11-26-1960, 57 y.o.   MRN: [id number] History of Present Illness: [name] is a 57 year old white female in for a well woman gyn exam, she had a normal pap with HPV 12/13/15. PCP is [name].    Current Medications, Allergies, Past Medical History, Past Surgical History, Family History and Social History were reviewed in [facility] electronic medical record.     Review of Systems:  Patient denies any daily headaches, hearing loss, fatigue, blurred vision, shortness of breath, chest pain, abdominal pain, problems with bowel movements, urination, or intercourse. No joint pain or mood swings.   Physical Exam:BP 140/80 (BP Location: Right Arm, Patient Position: Sitting, Cuff Size: Small)   Pulse 96   Ht 5\' 5"  (1.651 m)   Wt 163 lb (73.9 kg)   BMI 27.12 kg/m  General:  Well developed, well nourished, no acute distress Skin:  Warm and dry Neck:  Midline trachea, normal thyroid, good ROM, no lymphadenopathy Lungs; Clear to auscultation bilaterally Breast:  No dominant palpable mass, retraction, or nipple discharge,+hidradenitis Cardiovascular: Regular rate and rhythm Abdomen:  Soft, non tender, no hepatosplenomegaly Pelvic:  External genitalia is normal in appearance.  The vagina is normal in appearance. Urethra has no lesions or masses. The cervix is smooth.  Uterus is felt to be normal size, shape, and contour.  No adnexal masses or tenderness noted.Bladder is non tender, no masses felt. Rectal: Good sphincter tone, no polyps, or hemorrhoids felt.  Hemoccult negative. Extremities/musculoskeletal:  No swelling or varicosities noted, no clubbing or cyanosis,has abrasions on fingers where has been pulling weeds Psych:  No mood changes, alert and cooperative,seems happy PHQ 9 score 0.  Impression:  1. Encounter for well woman exam with routine gynecological exam   2. Hidradenitis   3. Screening for colorectal cancer   4. Smoker       Plan: Meds ordered this encounter  Medications  . triamcinolone ointment (KENALOG) 0.5 %    Sig: Apply 1 application topically 2 (two) times daily.    Dispense:  30 g    Refill:  1    Order Specific Question:   Supervising Provider    Answer:   [name], Alyssa H [[id number]]  Decrease smoking Use condoms if has sex Mammogram every 2 years Labs with PCP Colonoscopy per [name]  Pap and physical in 1 year

## 2018-01-20 ENCOUNTER — Ambulatory Visit (HOSPITAL_COMMUNITY)
Admission: RE | Admit: 2018-01-20 | Discharge: 2018-01-20 | Disposition: A | Discharge status: Home / Self Care | Payer: Medicaid Other | Source: Ambulatory Visit | Attending: Internal Medicine | Admitting: Internal Medicine

## 2018-01-20 DIAGNOSIS — Z1231 Encounter for screening mammogram for malignant neoplasm of breast: Secondary | ICD-10-CM | POA: Diagnosis not present

## 2018-05-01 NOTE — Congregational Nurse Program (Signed)
Congregational Nurse Program Note  Date of Encounter: 05/01/2018  Past Medical History: Past Medical History:  Diagnosis Date  . Diabetes mellitus without complication (HCC)   . Hidradenitis 12/13/2015  . Hypercholesterolemia   . Hypertension   . Smoker 12/13/2015    Encounter Details: CNP Questionnaire - 04/30/18 1130      Questionnaire   Patient Status  Not Applicable    Race  White or Caucasian    Location Patient Served At  [facility] [place]    Insurance  [employer]    Uninsured  Not Applicable    Food  No food insecurities    Housing/Utilities  Yes, have permanent housing    Transportation  No transportation needs    Interpersonal Safety  Yes, feel physically and emotionally safe where you currently live    Medication  No medication insecurities    Medical Provider  Yes    Referrals  Not Applicable    ED Visit Averted  Not Applicable    Life-Saving Intervention Made  Not Applicable     Seen at the food pantry. No complaints or problems B P 124/75 -P [address] [name] [address], [place] [facility] [phone]

## 2018-05-21 NOTE — Congregational Nurse Program (Signed)
Congregational Nurse Program Note  Date of Encounter: 05/21/2018  Past Medical History: Past Medical History:  Diagnosis Date  . Diabetes mellitus without complication (HCC)   . Hidradenitis 12/13/2015  . Hypercholesterolemia   . Hypertension   . Smoker 12/13/2015    Encounter Details: CNP Questionnaire - 05/21/18 1114      Questionnaire   Patient Status  Not Applicable    Race  White or Caucasian    Location Patient Served At  [facility] [place]    Insurance  [employer]    Uninsured  Not Applicable    Food  No food insecurities    Housing/Utilities  Yes, have permanent housing    Transportation  No transportation needs    Interpersonal Safety  Yes, feel physically and emotionally safe where you currently live    Medication  No medication insecurities    Medical Provider  Yes    Referrals  Not Applicable    ED Visit Averted  Not Applicable    Life-Saving Intervention Made  Not Applicable      Seen at the food pantry. No concerns. Sataed she is working on stopping smoking B P 129/75 P 98 Alyssa Olszewskimma Kyser Wandel,RN, [place] [facility] [phone]

## 2018-06-17 NOTE — Congregational Nurse Program (Signed)
Seen at [facility]. No complaints. BP 1333/72 P [address] Spencer Peterkin RN, [address] [place] [facility], [phone]

## 2018-06-24 NOTE — Congregational Nurse Program (Signed)
Seen at the [facility]. B/P 133/72 P [address] Karsen Fellows RN, [address] [place] [facility], [phone]

## 2018-10-08 ENCOUNTER — Emergency Department (HOSPITAL_COMMUNITY): Payer: Medicaid Other

## 2018-10-08 ENCOUNTER — Emergency Department (HOSPITAL_COMMUNITY)
Admission: EM | Admit: 2018-10-08 | Discharge: 2018-10-08 | Disposition: A | Discharge status: Home / Self Care | Payer: Medicaid Other | Attending: Emergency Medicine | Admitting: Emergency Medicine

## 2018-10-08 ENCOUNTER — Encounter (HOSPITAL_COMMUNITY): Payer: Self-pay | Admitting: *Deleted

## 2018-10-08 DIAGNOSIS — E119 Type 2 diabetes mellitus without complications: Secondary | ICD-10-CM | POA: Diagnosis not present

## 2018-10-08 DIAGNOSIS — Z794 Long term (current) use of insulin: Secondary | ICD-10-CM | POA: Diagnosis not present

## 2018-10-08 DIAGNOSIS — S4992XA Unspecified injury of left shoulder and upper arm, initial encounter: Secondary | ICD-10-CM | POA: Diagnosis present

## 2018-10-08 DIAGNOSIS — Y999 Unspecified external cause status: Secondary | ICD-10-CM | POA: Insufficient documentation

## 2018-10-08 DIAGNOSIS — Y929 Unspecified place or not applicable: Secondary | ICD-10-CM | POA: Insufficient documentation

## 2018-10-08 DIAGNOSIS — S42211A Unspecified displaced fracture of surgical neck of right humerus, initial encounter for closed fracture: Secondary | ICD-10-CM

## 2018-10-08 DIAGNOSIS — I10 Essential (primary) hypertension: Secondary | ICD-10-CM | POA: Insufficient documentation

## 2018-10-08 DIAGNOSIS — F17210 Nicotine dependence, cigarettes, uncomplicated: Secondary | ICD-10-CM | POA: Insufficient documentation

## 2018-10-08 DIAGNOSIS — Y939 Activity, unspecified: Secondary | ICD-10-CM | POA: Insufficient documentation

## 2018-10-08 DIAGNOSIS — W19XXXA Unspecified fall, initial encounter: Secondary | ICD-10-CM | POA: Insufficient documentation

## 2018-10-08 MED ORDER — TRAMADOL HCL 50 MG PO TABS
100.0000 mg | ORAL_TABLET | Freq: Once | ORAL | Status: AC
  Administered 2018-10-08: 100 mg via ORAL
  Filled 2018-10-08: qty 2

## 2018-10-08 MED ORDER — ONDANSETRON HCL 4 MG PO TABS
4.0000 mg | ORAL_TABLET | Freq: Once | ORAL | Status: AC
  Administered 2018-10-08: 4 mg via ORAL
  Filled 2018-10-08: qty 1

## 2018-10-08 MED ORDER — HYDROCODONE-ACETAMINOPHEN 5-325 MG PO TABS: 1.0000 | ORAL_TABLET | ORAL | 0 refills | Status: DC | PRN

## 2018-10-08 NOTE — ED Triage Notes (Signed)
Fell at home, c/o pain in right upper arm

## 2018-10-08 NOTE — Discharge Instructions (Addendum)
You have a fracture (break) of the upper right shoulder. Please use the shoulder immobilizer until seen by Dr Romeo Apple in the office. Apply ice over the next 3 days, on for 15 - 20 min, then off for 20 min. Use tylenol for mild pain. Use norco for more severe pain. Call Dr Romeo Apple tomorrow AM and set up an appointment. The number is on the discharge instruction sheet.

## 2018-10-08 NOTE — ED Provider Notes (Signed)
[facility] EMERGENCY DEPARTMENT Provider Note   CSN: [id number] Arrival date & time: 10/08/18  0946     History   Chief Complaint Chief Complaint  Patient presents with  . Arm Pain    HPI [name] is a 58 y.o. female.  The history is provided by the patient.  Arm Pain  This is a new problem. The current episode started 6 to 12 hours ago. The problem occurs constantly. The problem has not changed since onset.Pertinent negatives include no chest pain, no abdominal pain and no shortness of breath. Exacerbated by: movement. She has tried nothing for the symptoms. The treatment provided no relief.    Past Medical History:  Diagnosis Date  . Diabetes mellitus without complication (HCC)   . Hidradenitis 12/13/2015  . Hypercholesterolemia   . Hypertension   . Smoker 12/13/2015    Patient Active Problem List   Diagnosis Date Noted  . Special screening for malignant neoplasms, colon   . Hidradenitis 12/13/2015  . Smoker 12/13/2015  . Bipolar I disorder, most recent episode (or current) manic (HCC) 04/01/2015  . Unspecified constipation 06/26/2013  . Nausea and vomiting 06/25/2013  . Cellulitis and abscess of foot 06/23/2013  . DM (diabetes mellitus) (HCC) 06/23/2013  . HTN (hypertension) 06/23/2013  . Tobacco abuse 06/23/2013  . Hyperlipidemia 06/23/2013    Past Surgical History:  Procedure Laterality Date  . FLEXIBLE SIGMOIDOSCOPY N/A 09/30/2017   Procedure: FLEXIBLE SIGMOIDOSCOPY;  Surgeon: West Bali, MD;  Location: [facility];  Service: Endoscopy;  Laterality: N/A;     OB History    Gravida  1   Para  1   Term      Preterm      AB      Living        SAB      TAB      Ectopic      Multiple      Live Births               Home Medications    Prior to Admission medications   Medication Sig Start Date End Date Taking? Authorizing Provider  acetaminophen (TYLENOL) 325 MG tablet Take 325 mg by mouth 2 (two) times daily as  needed for moderate pain or headache.    [provider]  doxycycline (ADOXA) 100 MG tablet Take 100 mg by mouth 2 (two) times daily.    [provider]  gabapentin (NEURONTIN) 300 MG capsule Take 300 mg by mouth at bedtime.    [provider]  glipiZIDE-metformin (METAGLIP) 5-500 MG per tablet Take 2 tablets by mouth 2 (two) times daily.     [provider]  insulin glargine (LANTUS) 100 UNIT/ML injection Inject 0.18 mLs (18 Units total) into the skin at bedtime. Patient taking differently: Inject 30 Units at bedtime into the skin.  04/05/15   Pucilowska, Jolanta B, MD  lisinopril-hydrochlorothiazide (PRINZIDE,ZESTORETIC) 10-12.5 MG tablet Take 1 tablet by mouth daily.    [provider]  pravastatin (PRAVACHOL) 40 MG tablet Take 40 mg by mouth every evening.    [provider]  triamcinolone ointment (KENALOG) 0.5 % Apply 1 application topically 2 (two) times daily. 01/16/18   Adline Potter, NP    Family History Family History  Problem Relation Age of Onset  . Asthma Mother   . Cancer Mother   . Hypertension Mother   . Diabetes Mother   . Hypertension Father   . Diabetes Father   .  Stroke Brother   . Hypertension Maternal Grandmother   . Diabetes Maternal Grandmother   . Hyperlipidemia Maternal Grandmother   . Hypertension Maternal Grandfather   . Hyperlipidemia Maternal Grandfather   . Hypertension Paternal Grandmother   . Hyperlipidemia Paternal Grandmother   . Hypertension Paternal Grandfather   . Hyperlipidemia Paternal Grandfather     Social History Social History   Tobacco Use  . Smoking status: Current Every Day Smoker    Packs/day: 0.50    Years: 34.00    Pack years: 17.00    Types: Cigarettes  . Smokeless tobacco: Never Used  Substance Use Topics  . Alcohol use: No  . Drug use: No     Allergies   Patient has no known allergies.   Review of Systems Review of Systems  Constitutional: Negative for  activity change.       All ROS Neg except as noted in HPI  HENT: Negative for nosebleeds.   Eyes: Negative for photophobia and discharge.  Respiratory: Negative for cough, shortness of breath and wheezing.   Cardiovascular: Negative for chest pain and palpitations.  Gastrointestinal: Negative for abdominal pain and blood in stool.  Genitourinary: Negative for dysuria, frequency and hematuria.  Musculoskeletal: Positive for arthralgias. Negative for back pain and neck pain.  Skin: Negative.   Neurological: Negative for dizziness, seizures and speech difficulty.  Psychiatric/Behavioral: Negative for confusion and hallucinations.     Physical Exam Updated Vital Signs BP (!) 146/63   Pulse 94   Temp 97.7 F (36.5 C)   Resp 18   Ht 5\' 5"  (1.651 m)   Wt 72.6 kg   SpO2 95%   BMI 26.63 kg/m   Physical Exam Vitals signs and nursing note reviewed.  Constitutional:      Appearance: She is well-developed. She is not toxic-appearing.  HENT:     Head: Normocephalic.     Right Ear: Tympanic membrane and external ear normal.     Left Ear: Tympanic membrane and external ear normal.  Eyes:     General: Lids are normal.     Pupils: Pupils are equal, round, and reactive to light.  Neck:     Musculoskeletal: Normal range of motion and neck supple.     Vascular: No carotid bruit.  Cardiovascular:     Rate and Rhythm: Normal rate and regular rhythm.     Pulses: Normal pulses.     Heart sounds: Normal heart sounds.  Pulmonary:     Effort: No respiratory distress.     Breath sounds: Normal breath sounds.  Abdominal:     General: Bowel sounds are normal.     Palpations: Abdomen is soft.     Tenderness: There is no abdominal tenderness. There is no guarding.  Musculoskeletal:     Right shoulder: She exhibits decreased range of motion and pain.     Right upper arm: She exhibits tenderness.     Comments: There is mild tenderness and some swelling of the right shoulder extending into the  bicep tricep area.  There is tenderness to palpation.  There is tenderness with any movement.  There is good range of motion of the wrist and fingers.  The patient has pain when attempting to flex and extend the elbow in the shoulder area.  The radial pulses 2+.  Capillary refill is less than 3 seconds.  Lymphadenopathy:     Head:     Right side of head: No submandibular adenopathy.     Left side  of head: No submandibular adenopathy.     Cervical: No cervical adenopathy.  Skin:    General: Skin is warm and dry.  Neurological:     Mental Status: She is alert and oriented to person, place, and time.     Cranial Nerves: No cranial nerve deficit.     Sensory: No sensory deficit.  Psychiatric:        Speech: Speech normal.      ED Treatments / Results  Labs (all labs ordered are listed, but only abnormal results are displayed) Labs Reviewed - No data to display  EKG None  Radiology No results found.  Procedures Procedures (including critical care time)  FRACTURE CARE  Right Arm.  Patient sustained a fall and injured the right upper arm.  X-ray Reveals a fracture through the proximal humerus involving the surgical neck.  There is comminution present.  I discussed immobilization with the patient in terms of which she understands.  The patient gives permission for the procedure.  Patient identified by armband.  Procedural timeout taken.  Neurovascular stability established.  Patient placed in a shoulder immobilizer and sling.  After the procedure the radial pulses 2+.  There is no sensation of the device being too tight or any other problem.  Patient treated in the emergency department with medication for pain.  Patient tolerated the procedure without problem. Medications Ordered in ED Medications - No data to display   Initial Impression / Assessment and Plan / ED Course  I have reviewed the triage vital signs and the nursing notes.  Pertinent labs & imaging results that  were available during my care of the patient were reviewed by me and considered in my medical decision making (see chart for details).       Final Clinical Impressions(s) / ED Diagnoses MDM  Patient sustained a fall and injured the right shoulder.  X-ray shows comminution fracture involving the surgical neck of the right humerus.  No gross neurovascular deficits appreciated.  The patient was immobilized with a shoulder immobilizer and sling.  Prescription for medication for pain given to the patient.  The patient will follow-up with Dr. Romeo Apple with orthopedics as soon as possible.  The patient will return to the emergency department if any changes, problems, or concerns.   Final diagnoses:  Closed displaced fracture of surgical neck of right humerus, unspecified fracture morphology, initial encounter    ED Discharge Orders         Ordered    HYDROcodone-acetaminophen (NORCO/VICODIN) 5-325 MG tablet  Every 4 hours PRN     10/08/18 1523           Ivery Quale, PA-C 10/09/18 2222    Bethann Berkshire, MD 10/11/18 1359

## 2018-10-10 ENCOUNTER — Telehealth: Payer: Self-pay | Admitting: Orthopedic Surgery

## 2018-10-10 NOTE — Telephone Encounter (Signed)
Pt's caregiver came by to schedule an appointment.  I told her that I was unsure if we could get her in today but I would gladly check on this for her.  She said it was okay because [name] couldn't come in today.  She was given an appointment for this coming Monday at 10:00

## 2018-10-13 ENCOUNTER — Encounter: Payer: Self-pay | Admitting: Orthopedic Surgery

## 2018-10-13 ENCOUNTER — Ambulatory Visit: Payer: Medicaid Other | Admitting: Orthopedic Surgery

## 2018-10-13 ENCOUNTER — Ambulatory Visit (INDEPENDENT_AMBULATORY_CARE_PROVIDER_SITE_OTHER): Payer: Medicaid Other

## 2018-10-13 VITALS — BP 131/75 | HR 132 | Ht 65.0 in | Wt 154.0 lb

## 2018-10-13 DIAGNOSIS — S42291A Other displaced fracture of upper end of right humerus, initial encounter for closed fracture: Secondary | ICD-10-CM

## 2018-10-13 MED ORDER — HYDROCODONE-ACETAMINOPHEN 5-325 MG PO TABS
1.0000 | ORAL_TABLET | ORAL | 0 refills | Status: DC | PRN
Start: 2018-10-13 — End: 2018-10-17

## 2018-10-13 NOTE — Progress Notes (Signed)
NEW PATIENT OFFICE VISIT  Chief Complaint  Patient presents with  . Arm Injury    Right arm DOI     58 year old female with diabetes hypertension smoker presents with proximal humerus fracture after falling at home  Date of injury was January 8 mechanism mechanical fall  Patient complains of pain over the right shoulder which currently is mild with rest more severe with movement associated with decreased range of motion bruising of the upper arm without numbness or tingling   Review of Systems  Constitutional: Negative for chills and fever.  Musculoskeletal: Negative for neck pain.  Skin: Negative for itching and rash.  Neurological: Negative for sensory change.     Past Medical History:  Diagnosis Date  . Diabetes mellitus without complication (HCC)   . Hidradenitis 12/13/2015  . Hypercholesterolemia   . Hypertension   . Smoker 12/13/2015    Past Surgical History:  Procedure Laterality Date  . FLEXIBLE SIGMOIDOSCOPY N/A 09/30/2017   Procedure: FLEXIBLE SIGMOIDOSCOPY;  Surgeon: West BaliFields, Sandi L, MD;  Location: [facility];  Service: Endoscopy;  Laterality: N/A;    Family History  Problem Relation Age of Onset  . Asthma Mother   . Cancer Mother   . Hypertension Mother   . Diabetes Mother   . Hypertension Father   . Diabetes Father   . Stroke Brother   . Hypertension Maternal Grandmother   . Diabetes Maternal Grandmother   . Hyperlipidemia Maternal Grandmother   . Hypertension Maternal Grandfather   . Hyperlipidemia Maternal Grandfather   . Hypertension Paternal Grandmother   . Hyperlipidemia Paternal Grandmother   . Hypertension Paternal Grandfather   . Hyperlipidemia Paternal Grandfather    Social History   Tobacco Use  . Smoking status: Current Every Day Smoker    Packs/day: 0.50    Years: 34.00    Pack years: 17.00    Types: Cigarettes  . Smokeless tobacco: Never Used  Substance Use Topics  . Alcohol use: No  . Drug use: No    No Known  Allergies  Current Meds  Medication Sig  . acetaminophen (TYLENOL) 325 MG tablet Take 325 mg by mouth 2 (two) times daily as needed for moderate pain or headache.  Marland Kitchen. glipiZIDE-metformin (METAGLIP) 5-500 MG per tablet Take 2 tablets by mouth 2 (two) times daily.   Marland Kitchen. HYDROcodone-acetaminophen (NORCO/VICODIN) 5-325 MG tablet Take 1 tablet by mouth every 4 (four) hours as needed for up to 7 days.  . insulin glargine (LANTUS) 100 UNIT/ML injection Inject 0.18 mLs (18 Units total) into the skin at bedtime. (Patient taking differently: Inject 19 Units into the skin at bedtime. )  . lisinopril-hydrochlorothiazide (PRINZIDE,ZESTORETIC) 10-12.5 MG tablet Take 1 tablet by mouth daily.  . pravastatin (PRAVACHOL) 40 MG tablet Take 40 mg by mouth every evening.  . triamcinolone ointment (KENALOG) 0.5 % Apply 1 application topically 2 (two) times daily.  . [DISCONTINUED] HYDROcodone-acetaminophen (NORCO/VICODIN) 5-325 MG tablet Take 1 tablet by mouth every 4 (four) hours as needed.    BP 131/75   Pulse (!) 132   Ht 5\' 5"  (1.651 m)   Wt 154 lb (69.9 kg)   BMI 25.63 kg/m   Physical Exam HENT:     Head: Normocephalic and atraumatic.  Skin:    General: Skin is warm and dry.     Capillary Refill: Capillary refill takes less than 2 seconds.     Findings: Bruising present.  Neurological:     General: No focal deficit present.  Mental Status: She is alert and oriented to person, place, and time.     Sensory: No sensory deficit.     Gait: Gait normal.  Psychiatric:        Attention and Perception: Attention normal.        Mood and Affect: Affect is labile, blunt and flat.        Speech: Speech normal.        Behavior: Behavior normal. Behavior is cooperative.        Thought Content: Thought content normal.        Cognition and Memory: Cognition normal.        Judgment: Judgment normal.     Right Shoulder Exam   Tenderness  Right shoulder tenderness location: Proximal humerus and  shoulder.  Range of Motion  Active abduction:  0 abnormal  Passive abduction: abnormal  Extension: abnormal  External rotation: abnormal  Forward flexion:  0 abnormal  Internal rotation 0 degrees: abnormal  Internal rotation 90 degrees: abnormal   Muscle Strength  The patient has normal right shoulder strength (Pain is interfering with examination). Abduction: 0/5  Internal rotation: 0/5  External rotation: 0/5  Supraspinatus: 0/5  Subscapularis: 0/5  Biceps: 5/5   Other  Erythema: absent Sensation: normal Pulse: present  Comments:  Right upper arm has ecchymosis throughout the soft tissues  Stability could not be tested because of the pain and fracture anatomy   Left Shoulder Exam  Left shoulder exam is normal.  Tenderness  The patient is experiencing no tenderness.   Range of Motion  The patient has normal left shoulder ROM.  Muscle Strength  The patient has normal left shoulder strength.  Tests  Apprehension: negative  Other  Erythema: absent Sensation: normal Pulse: present         MEDICAL DECISION SECTION  [facility]:  Recent fall with right shoulder pain, initial encounter   EXAM: RIGHT HUMERUS - 2+ VIEW   COMPARISON:  None.   FINDINGS: Fracture through the proximal humerus involving primarily the surgical neck is noted. Comminution in the more proximal fragments is noted. No definitive dislocation is seen. The distal humerus is unremarkable.   IMPRESSION: Comminuted proximal humeral fracture involving primarily the surgical neck     Electronically Signed   By: Alcide CleverMark  Lukens M.D.   On: 10/08/2018 13:08  My independent reading of xrays:  X-rays were done of the humerus and not the shoulder so there and adequate to assess the fracture.  The fracture is comminuted there is impaction of the head shaft with what appears to be a greater tuberosity fragment.  X-rays will be done of the right shoulder and  then a CAT scan will be done to a further evaluate the fracture.   See the dictated x-ray report from today's film but the x-ray shows a percent displacement of the head and shaft  Recommend CT scan as stated Encounter Diagnosis  Name Primary?  . Other closed displaced fracture of proximal end of right humerus, initial encounter Yes    PLAN: (Rx., injectx, surgery, frx, mri/ct) Continue the sling  I refilled her medicine     CT scan       follow-up after CT scan to determine if surgery necessary  Meds ordered this encounter  Medications  . HYDROcodone-acetaminophen (NORCO/VICODIN) 5-325 MG tablet    Sig: Take 1 tablet by mouth every 4 (four) hours as needed for up to 7 days.    Dispense:  15 tablet    Refill:  0    Fuller Canada, MD  10/13/2018 10:14 AM

## 2018-10-14 ENCOUNTER — Telehealth: Payer: Self-pay | Admitting: Radiology

## 2018-10-14 DIAGNOSIS — S42201A Unspecified fracture of upper end of right humerus, initial encounter for closed fracture: Secondary | ICD-10-CM | POA: Insufficient documentation

## 2018-10-14 NOTE — Telephone Encounter (Signed)
Called to advise CT scan is scheduled for tomorrow 6pm arrive at 545. This is not good for them, she has a nurse in the am, and scan has to be in the am. I have called to RS they can do the study at 1030 am tomorrow. Arrive at 1015.

## 2018-10-15 ENCOUNTER — Ambulatory Visit (HOSPITAL_COMMUNITY): Payer: Medicaid Other

## 2018-10-15 ENCOUNTER — Ambulatory Visit (HOSPITAL_COMMUNITY)
Admission: RE | Admit: 2018-10-15 | Discharge: 2018-10-15 | Disposition: A | Discharge status: Home / Self Care | Payer: Medicaid Other | Source: Ambulatory Visit | Attending: Orthopedic Surgery | Admitting: Orthopedic Surgery

## 2018-10-15 DIAGNOSIS — S42291A Other displaced fracture of upper end of right humerus, initial encounter for closed fracture: Secondary | ICD-10-CM | POA: Diagnosis not present

## 2018-10-17 ENCOUNTER — Encounter: Payer: Self-pay | Admitting: Orthopedic Surgery

## 2018-10-17 ENCOUNTER — Ambulatory Visit: Payer: Medicaid Other | Admitting: Orthopedic Surgery

## 2018-10-17 DIAGNOSIS — S42291D Other displaced fracture of upper end of right humerus, subsequent encounter for fracture with routine healing: Secondary | ICD-10-CM

## 2018-10-17 DIAGNOSIS — S42291A Other displaced fracture of upper end of right humerus, initial encounter for closed fracture: Secondary | ICD-10-CM

## 2018-10-17 MED ORDER — HYDROCODONE-ACETAMINOPHEN 5-325 MG PO TABS: 1.0000 | ORAL_TABLET | ORAL | 0 refills | Status: DC | PRN

## 2018-10-17 NOTE — Progress Notes (Signed)
Patient ID: [name], female   DOB: Jul 17, 1961, 58 y.o.   MRN: [id number]  FRACTURE CARE   Chief Complaint  Patient presents with  . Shoulder Pain    right shoulder fracture, review CT date of injury 10/08/18    Encounter Diagnoses  Name Primary?  . Other closed displaced fracture of proximal end of right humerus with routine healing, subsequent encounter   . Other closed displaced fracture of proximal end of right humerus, initial encounter     POST INJURY DAY: 9  GLOBAL PERIOD DAY 4/90  CT scan was ordered to evaluate this proximal humerus fracture.  The CT scan shows that the humeral head is greater than 45 degrees of angulation anteriorly with impaction on the shaft on the AP view and the head and shaft are impacted with less than 45 degrees of angulation  There is no dislocation  REPORT  CLINICAL DATA:  Fracture of the proximal right humerus secondary to a fall on 10/08/2018.   EXAM: CT OF THE UPPER RIGHT EXTREMITY WITHOUT CONTRAST   TECHNIQUE: Multidetector CT imaging of the upper right extremity was performed according to the standard protocol.   COMPARISON:  Radiographs dated 10/13/2018   FINDINGS: Bones/Joint/Cartilage   There is impacted angulated comminuted fracture surgical neck of the proximal right humerus. The greater and lesser trochanters are intact. No dislocation. The humeral head is slightly posteriorly subluxed with respect to the glenoid.   There is a hemarthrosis with multiple tiny bone fragments as well as some gas in the joint and in the bones. Scapula is intact. Clavicle is intact.   IMPRESSION: Comminuted impacted angulated fracture of the surgical neck of the proximal right humerus as described.     Electronically Signed   By: Francene Boyers M.D.   On: 10/15/2018 85:81    58 year old female with heavy smoking history diabetes had a CT scan reviewed it with them and both of [place] agree that she should have nonoperative  treatment  X-ray in a week continue sling  Medication refilled  Meds ordered this encounter  Medications  . HYDROcodone-acetaminophen (NORCO/VICODIN) 5-325 MG tablet    Sig: Take 1 tablet by mouth every 4 (four) hours as needed for up to 7 days.    Dispense:  42 tablet    Refill:  0

## 2018-10-24 ENCOUNTER — Ambulatory Visit (INDEPENDENT_AMBULATORY_CARE_PROVIDER_SITE_OTHER): Payer: Medicaid Other | Admitting: Orthopedic Surgery

## 2018-10-24 ENCOUNTER — Ambulatory Visit: Payer: Medicaid Other | Admitting: Orthopedic Surgery

## 2018-10-24 ENCOUNTER — Encounter: Payer: Self-pay | Admitting: Orthopedic Surgery

## 2018-10-24 ENCOUNTER — Ambulatory Visit (INDEPENDENT_AMBULATORY_CARE_PROVIDER_SITE_OTHER): Payer: Medicaid Other

## 2018-10-24 VITALS — BP 120/69 | HR 125 | Ht 65.0 in | Wt 154.0 lb

## 2018-10-24 DIAGNOSIS — S42291A Other displaced fracture of upper end of right humerus, initial encounter for closed fracture: Secondary | ICD-10-CM

## 2018-10-24 DIAGNOSIS — S42291D Other displaced fracture of upper end of right humerus, subsequent encounter for fracture with routine healing: Secondary | ICD-10-CM | POA: Diagnosis not present

## 2018-10-24 MED ORDER — IBUPROFEN 800 MG PO TABS: 800.0000 mg | ORAL_TABLET | Freq: Three times a day (TID) | ORAL | 0 refills | Status: DC | PRN

## 2018-10-24 MED ORDER — HYDROCODONE-ACETAMINOPHEN 5-325 MG PO TABS: 1.0000 | ORAL_TABLET | ORAL | 0 refills | Status: AC | PRN

## 2018-10-24 NOTE — Addendum Note (Signed)
Addended byCaffie Damme on: 10/24/2018 11:12 AM   Modules accepted: Orders

## 2018-10-24 NOTE — Progress Notes (Signed)
Patient ID: [name], female   DOB: [birth year], 58 y.o.   MRN: [id number]  FRACTURE CARE   Chief Complaint  Patient presents with  . Shoulder Injury    Encounter Diagnoses  Name Primary?  . Other closed displaced fracture of proximal end of right humerus with routine healing, subsequent encounter Yes  . Other closed displaced fracture of proximal end of right humerus, initial encounter     POST INJURY DAY: 16  GLOBAL PERIOD DAY 11/90  Repeat x-rays proximal humerus fracture which the family and I recommended nonoperative treatment based on the patient's medical history  X-ray shows displacement of the humeral head from the shaft on the lateral x-ray  Her passive motion is relatively good  Start therapy on the shoulder  Medications Meds ordered this encounter  Medications  . ibuprofen (ADVIL,MOTRIN) 800 MG tablet    Sig: Take 1 tablet (800 mg total) by mouth every 8 (eight) hours as needed.    Dispense:  30 tablet    Refill:  0  . HYDROcodone-acetaminophen (NORCO/VICODIN) 5-325 MG tablet    Sig: Take 1 tablet by mouth every 4 (four) hours as needed for up to 7 days.    Dispense:  42 tablet    Refill:  0    F/u up in 2 months

## 2018-11-12 ENCOUNTER — Ambulatory Visit (HOSPITAL_COMMUNITY): Payer: Medicaid Other

## 2018-12-22 ENCOUNTER — Ambulatory Visit (INDEPENDENT_AMBULATORY_CARE_PROVIDER_SITE_OTHER): Payer: Medicaid Other | Admitting: Orthopedic Surgery

## 2018-12-22 ENCOUNTER — Other Ambulatory Visit: Payer: Self-pay

## 2018-12-22 ENCOUNTER — Ambulatory Visit (INDEPENDENT_AMBULATORY_CARE_PROVIDER_SITE_OTHER): Payer: Medicaid Other

## 2018-12-22 ENCOUNTER — Encounter: Payer: Self-pay | Admitting: Orthopedic Surgery

## 2018-12-22 VITALS — BP 122/79 | HR 103 | Ht 65.0 in | Wt 154.0 lb

## 2018-12-22 DIAGNOSIS — S42291D Other displaced fracture of upper end of right humerus, subsequent encounter for fracture with routine healing: Secondary | ICD-10-CM

## 2018-12-22 MED ORDER — TRAMADOL HCL 50 MG PO TABS: 50.0000 mg | ORAL_TABLET | Freq: Four times a day (QID) | ORAL | 0 refills | Status: AC | PRN

## 2018-12-22 NOTE — Progress Notes (Signed)
Chief Complaint  Patient presents with  . Shoulder Pain    10/08/18 RIGHT shoulder fracture patient declined therapy has not removed sling since last visit    58 year old female proximal humerus fracture right shoulder was placed in a sling.  No surgery was done because of her medical problems and her smoking history  She comes in complaining of mild discomfort in the right shoulder she never went for therapy declined.  Complains of discomfort over the right shoulder and stiffness  Exam shows passive motion is 90 degrees in flexion and abduction 40 in external rotation  X-ray shows slight displacement of the fracture especially on the lateral x-ray we also see abduction of the greater tuberosity fragment  Recommend nonoperative treatment   Start home exercises  remove the sling   Fu 2 mos, no x-ray needed  Meds ordered this encounter  Medications  . traMADol (ULTRAM) 50 MG tablet    Sig: Take 1 tablet (50 mg total) by mouth every 6 (six) hours as needed for up to 7 days.    Dispense:  28 tablet    Refill:  0

## 2018-12-22 NOTE — Patient Instructions (Signed)
Do the exercises twice a day   You can take the sling off

## 2019-01-23 ENCOUNTER — Other Ambulatory Visit: Payer: Self-pay | Admitting: Orthopedic Surgery

## 2019-02-25 ENCOUNTER — Other Ambulatory Visit: Payer: Self-pay

## 2019-02-25 ENCOUNTER — Ambulatory Visit: Payer: Medicaid Other | Admitting: Orthopedic Surgery

## 2019-02-25 ENCOUNTER — Encounter: Payer: Self-pay | Admitting: Orthopedic Surgery

## 2019-02-25 VITALS — BP 112/76 | HR 83 | Temp 97.9°F | Ht 65.0 in | Wt 148.0 lb

## 2019-02-25 DIAGNOSIS — S42291D Other displaced fracture of upper end of right humerus, subsequent encounter for fracture with routine healing: Secondary | ICD-10-CM

## 2019-02-25 NOTE — Progress Notes (Signed)
Patient ID: [name], female   DOB: Nov 08, 1960, 58 y.o.   MRN: [id number]  Routine office visit follow-up  Chief Complaint  Patient presents with  . Fracture    Rt shoulder DOI 10/08/18    Encounter Diagnosis  Name Primary?  . Other closed displaced fracture of proximal end of right humerus with routine healing, subsequent encounter 10/08/18 Yes   58 year old female with multiple medical problems was evaluated and it was decided that she is not a surgical candidate she was treated nonoperatively with sling and swathe followed by physical therapy  No complaints of pain but does have obvious stiffness.  Most ADLs can be completed   BP 112/76   Pulse 83   Temp 97.9 F (36.6 C)   Ht 5\' 5"  (1.651 m)   Wt 148 lb (67.1 kg)   BMI 24.63 kg/m   Active abduction 45 degrees active flexion 60 degrees Passive abduction 60 degrees flexion 80 degrees  Encounter Diagnosis  Name Primary?  . Other closed displaced fracture of proximal end of right humerus with routine healing, subsequent encounter 10/08/18 Yes    The patient is able to perform most of her ADLs does not have significant complaints of pain recommend release and follow-up as needed

## 2019-03-09 ENCOUNTER — Other Ambulatory Visit (HOSPITAL_COMMUNITY): Payer: Self-pay | Admitting: Internal Medicine

## 2019-03-09 DIAGNOSIS — Z1231 Encounter for screening mammogram for malignant neoplasm of breast: Secondary | ICD-10-CM

## 2019-03-23 ENCOUNTER — Ambulatory Visit (HOSPITAL_COMMUNITY)
Admission: RE | Admit: 2019-03-23 | Discharge: 2019-03-23 | Disposition: A | Discharge status: Home / Self Care | Payer: Medicaid Other | Source: Ambulatory Visit | Attending: Internal Medicine | Admitting: Internal Medicine

## 2019-03-23 ENCOUNTER — Other Ambulatory Visit: Payer: Self-pay

## 2019-03-23 DIAGNOSIS — Z1231 Encounter for screening mammogram for malignant neoplasm of breast: Secondary | ICD-10-CM | POA: Diagnosis not present

## 2020-03-25 ENCOUNTER — Emergency Department (HOSPITAL_COMMUNITY): Payer: Medicaid Other

## 2020-03-25 ENCOUNTER — Other Ambulatory Visit: Payer: Self-pay

## 2020-03-25 ENCOUNTER — Encounter (HOSPITAL_COMMUNITY): Payer: Self-pay | Admitting: *Deleted

## 2020-03-25 ENCOUNTER — Inpatient Hospital Stay (HOSPITAL_COMMUNITY)
Admission: EM | Admit: 2020-03-25 | Discharge: 2020-03-28 | DRG: 638 | Disposition: A | Discharge status: Home / Self Care | Payer: Medicaid Other | Attending: Family Medicine | Admitting: Family Medicine

## 2020-03-25 DIAGNOSIS — E11628 Type 2 diabetes mellitus with other skin complications: Secondary | ICD-10-CM | POA: Diagnosis present

## 2020-03-25 DIAGNOSIS — L03119 Cellulitis of unspecified part of limb: Secondary | ICD-10-CM | POA: Diagnosis not present

## 2020-03-25 DIAGNOSIS — B957 Other staphylococcus as the cause of diseases classified elsewhere: Secondary | ICD-10-CM | POA: Diagnosis present

## 2020-03-25 DIAGNOSIS — F319 Bipolar disorder, unspecified: Secondary | ICD-10-CM | POA: Diagnosis present

## 2020-03-25 DIAGNOSIS — E785 Hyperlipidemia, unspecified: Secondary | ICD-10-CM | POA: Diagnosis present

## 2020-03-25 DIAGNOSIS — L84 Corns and callosities: Secondary | ICD-10-CM | POA: Diagnosis present

## 2020-03-25 DIAGNOSIS — F3110 Bipolar disorder, current episode manic without psychotic features, unspecified: Secondary | ICD-10-CM | POA: Diagnosis not present

## 2020-03-25 DIAGNOSIS — L02619 Cutaneous abscess of unspecified foot: Secondary | ICD-10-CM | POA: Diagnosis not present

## 2020-03-25 DIAGNOSIS — I10 Essential (primary) hypertension: Secondary | ICD-10-CM | POA: Diagnosis present

## 2020-03-25 DIAGNOSIS — F17200 Nicotine dependence, unspecified, uncomplicated: Secondary | ICD-10-CM | POA: Diagnosis not present

## 2020-03-25 DIAGNOSIS — E1142 Type 2 diabetes mellitus with diabetic polyneuropathy: Secondary | ICD-10-CM | POA: Diagnosis present

## 2020-03-25 DIAGNOSIS — M869 Osteomyelitis, unspecified: Secondary | ICD-10-CM | POA: Diagnosis not present

## 2020-03-25 DIAGNOSIS — D72829 Elevated white blood cell count, unspecified: Secondary | ICD-10-CM | POA: Diagnosis present

## 2020-03-25 DIAGNOSIS — Z7984 Long term (current) use of oral hypoglycemic drugs: Secondary | ICD-10-CM | POA: Diagnosis not present

## 2020-03-25 DIAGNOSIS — Z79899 Other long term (current) drug therapy: Secondary | ICD-10-CM

## 2020-03-25 DIAGNOSIS — L03116 Cellulitis of left lower limb: Secondary | ICD-10-CM | POA: Diagnosis present

## 2020-03-25 DIAGNOSIS — Z8249 Family history of ischemic heart disease and other diseases of the circulatory system: Secondary | ICD-10-CM

## 2020-03-25 DIAGNOSIS — E1165 Type 2 diabetes mellitus with hyperglycemia: Secondary | ICD-10-CM | POA: Diagnosis present

## 2020-03-25 DIAGNOSIS — Z20822 Contact with and (suspected) exposure to covid-19: Secondary | ICD-10-CM | POA: Diagnosis present

## 2020-03-25 DIAGNOSIS — E1149 Type 2 diabetes mellitus with other diabetic neurological complication: Secondary | ICD-10-CM | POA: Diagnosis present

## 2020-03-25 DIAGNOSIS — Z720 Tobacco use: Secondary | ICD-10-CM | POA: Diagnosis present

## 2020-03-25 DIAGNOSIS — L02612 Cutaneous abscess of left foot: Secondary | ICD-10-CM | POA: Diagnosis present

## 2020-03-25 DIAGNOSIS — E119 Type 2 diabetes mellitus without complications: Secondary | ICD-10-CM

## 2020-03-25 DIAGNOSIS — Z716 Tobacco abuse counseling: Secondary | ICD-10-CM | POA: Diagnosis not present

## 2020-03-25 DIAGNOSIS — F17213 Nicotine dependence, cigarettes, with withdrawal: Secondary | ICD-10-CM | POA: Diagnosis not present

## 2020-03-25 DIAGNOSIS — E871 Hypo-osmolality and hyponatremia: Secondary | ICD-10-CM | POA: Diagnosis present

## 2020-03-25 DIAGNOSIS — Z833 Family history of diabetes mellitus: Secondary | ICD-10-CM | POA: Diagnosis not present

## 2020-03-25 LAB — BASIC METABOLIC PANEL
Anion gap: 14 (ref 5–15)
BUN: 17 mg/dL (ref 6–20)
CO2: 23 mmol/L (ref 22–32)
Calcium: 8.8 mg/dL — ABNORMAL LOW (ref 8.9–10.3)
Chloride: 92 mmol/L — ABNORMAL LOW (ref 98–111)
Creatinine, Ser: 0.91 mg/dL (ref 0.44–1.00)
GFR calc Af Amer: >60 mL/min (ref >60)
GFR calc non Af Amer: >60 mL/min (ref >60)
Glucose, Bld: 194 mg/dL — ABNORMAL HIGH (ref 70–99)
Potassium: 3.7 mmol/L (ref 3.5–5.1)
Sodium: 129 mmol/L — ABNORMAL LOW (ref 135–145)

## 2020-03-25 LAB — CBC WITH DIFFERENTIAL/PLATELET
Abs Immature Granulocytes: 0.22 10*3/uL — ABNORMAL HIGH (ref 0.00–0.07)
Basophils Absolute: 0 10*3/uL (ref 0.0–0.1)
Basophils Relative: 0 %
Eosinophils Absolute: 0 10*3/uL (ref 0.0–0.5)
Eosinophils Relative: 0 %
HCT: 39.1 % (ref 36.0–46.0)
Hemoglobin: 13.3 g/dL (ref 12.0–15.0)
Immature Granulocytes: 1 %
Lymphocytes Relative: 9 %
Lymphs Abs: 2 10*3/uL (ref 0.7–4.0)
MCH: 32 pg (ref 26.0–34.0)
MCHC: 34 g/dL (ref 30.0–36.0)
MCV: 94.2 fL (ref 80.0–100.0)
Monocytes Absolute: 1.2 10*3/uL — ABNORMAL HIGH (ref 0.1–1.0)
Monocytes Relative: 5 %
Neutro Abs: 19.9 10*3/uL — ABNORMAL HIGH (ref 1.7–7.7)
Neutrophils Relative %: 85 %
Platelets: 224 10*3/uL (ref 150–400)
RBC: 4.15 MIL/uL (ref 3.87–5.11)
RDW: 11.2 % — ABNORMAL LOW (ref 11.5–15.5)
WBC: 23.4 10*3/uL — ABNORMAL HIGH (ref 4.0–10.5)
nRBC: 0 % (ref 0.0–0.2)

## 2020-03-25 LAB — GLUCOSE, CAPILLARY: Glucose-Capillary: 220 mg/dL — ABNORMAL HIGH (ref 70–99)

## 2020-03-25 LAB — SEDIMENTATION RATE: Sed Rate: 50 mm/hr — ABNORMAL HIGH (ref 0–22)

## 2020-03-25 LAB — C-REACTIVE PROTEIN: CRP: 17.5 mg/dL — ABNORMAL HIGH (ref <1.0)

## 2020-03-25 LAB — LACTIC ACID, PLASMA
Lactic Acid, Venous: 2.4 mmol/L (ref 0.5–1.9)
Lactic Acid, Venous: 1.5 mmol/L (ref 0.5–1.9)

## 2020-03-25 LAB — SARS CORONAVIRUS 2 BY RT PCR (HOSPITAL ORDER, PERFORMED IN ~~LOC~~ HOSPITAL LAB): SARS Coronavirus 2: NEGATIVE

## 2020-03-25 MED ORDER — ENOXAPARIN SODIUM 40 MG/0.4ML ~~LOC~~ SOLN
40.0000 mg | SUBCUTANEOUS | Status: DC
  Administered 2020-03-25 – 2020-03-27 (×3): 40 mg via SUBCUTANEOUS
  Filled 2020-03-25 (×3): qty 0.4

## 2020-03-25 MED ORDER — INSULIN ASPART 100 UNIT/ML ~~LOC~~ SOLN
0.0000 [IU] | Freq: Three times a day (TID) | SUBCUTANEOUS | Status: DC
  Administered 2020-03-26: 2 [IU] via SUBCUTANEOUS

## 2020-03-25 MED ORDER — ONDANSETRON HCL 4 MG PO TABS: 4.0000 mg | ORAL_TABLET | Freq: Four times a day (QID) | ORAL | Status: DC | PRN

## 2020-03-25 MED ORDER — LISINOPRIL 10 MG PO TABS
10.0000 mg | ORAL_TABLET | Freq: Every day | ORAL | Status: DC
  Administered 2020-03-26 – 2020-03-28 (×3): 10 mg via ORAL
  Filled 2020-03-25 (×3): qty 1

## 2020-03-25 MED ORDER — ONDANSETRON HCL 4 MG/2ML IJ SOLN: 4.0000 mg | Freq: Four times a day (QID) | INTRAMUSCULAR | Status: DC | PRN

## 2020-03-25 MED ORDER — SODIUM CHLORIDE 0.9 % IV SOLN: INTRAVENOUS | Status: DC

## 2020-03-25 MED ORDER — PIPERACILLIN-TAZOBACTAM 3.375 G IVPB 30 MIN
3.3750 g | Freq: Once | INTRAVENOUS | Status: AC
  Administered 2020-03-25: 3.375 g via INTRAVENOUS
  Filled 2020-03-25: qty 50

## 2020-03-25 MED ORDER — ACETAMINOPHEN 325 MG PO TABS
650.0000 mg | ORAL_TABLET | Freq: Four times a day (QID) | ORAL | Status: DC | PRN
  Administered 2020-03-27: 650 mg via ORAL
  Filled 2020-03-25: qty 2

## 2020-03-25 MED ORDER — SODIUM CHLORIDE 0.9 % IV BOLUS
500.0000 mL | Freq: Once | INTRAVENOUS | Status: AC
  Administered 2020-03-25: 500 mL via INTRAVENOUS

## 2020-03-25 MED ORDER — GADOBUTROL 1 MMOL/ML IV SOLN
7.5000 mL | Freq: Once | INTRAVENOUS | Status: AC | PRN
  Administered 2020-03-25: 7.5 mL via INTRAVENOUS

## 2020-03-25 MED ORDER — VANCOMYCIN HCL 750 MG/150ML IV SOLN
750.0000 mg | Freq: Two times a day (BID) | INTRAVENOUS | Status: DC
  Administered 2020-03-26 – 2020-03-28 (×5): 750 mg via INTRAVENOUS
  Filled 2020-03-25 (×10): qty 150

## 2020-03-25 MED ORDER — VANCOMYCIN HCL IN DEXTROSE 1-5 GM/200ML-% IV SOLN
1000.0000 mg | Freq: Once | INTRAVENOUS | Status: AC
  Administered 2020-03-25: 1000 mg via INTRAVENOUS
  Filled 2020-03-25: qty 200

## 2020-03-25 MED ORDER — ACETAMINOPHEN 650 MG RE SUPP: 650.0000 mg | Freq: Four times a day (QID) | RECTAL | Status: DC | PRN

## 2020-03-25 MED ORDER — PIPERACILLIN-TAZOBACTAM 3.375 G IVPB
3.3750 g | Freq: Three times a day (TID) | INTRAVENOUS | Status: DC
  Administered 2020-03-25 – 2020-03-28 (×8): 3.375 g via INTRAVENOUS
  Filled 2020-03-25 (×8): qty 50

## 2020-03-25 NOTE — ED Provider Notes (Addendum)
[facility] EMERGENCY DEPARTMENT Provider Note   CSN: [id number] Arrival date & time: 03/25/20  1322     History Chief Complaint  Patient presents with  . Leg Swelling    left    [name] is a 59 y.o. female.  Patient presents with a few day history of redness and swelling to her left lower foot.  With some streaking up the leg.  Also there is redness at the ankle area.  Patient denies any injury.  But on the bottom the foot seems to be a heavy callus.  Could be an ulcer.  But not open.  Past medical history is significant for diabetes hypertension high cholesterol.  Patient is an everyday smoker.  Patient denies any fevers.  No upper respiratory symptoms.        Past Medical History:  Diagnosis Date  . Diabetes mellitus without complication (HCC)   . Hidradenitis 12/13/2015  . Hypercholesterolemia   . Hypertension   . Smoker 12/13/2015    Patient Active Problem List   Diagnosis Date Noted  . Closed fracture of right proximal humerus 10/08/18 10/14/2018  . Special screening for malignant neoplasms, colon   . Hidradenitis 12/13/2015  . Smoker 12/13/2015  . Bipolar I disorder, most recent episode (or current) manic (HCC) 04/01/2015  . Unspecified constipation 06/26/2013  . Nausea and vomiting 06/25/2013  . Cellulitis and abscess of foot 06/23/2013  . DM (diabetes mellitus) (HCC) 06/23/2013  . HTN (hypertension) 06/23/2013  . Tobacco abuse 06/23/2013  . Hyperlipidemia 06/23/2013    Past Surgical History:  Procedure Laterality Date  . FLEXIBLE SIGMOIDOSCOPY N/A 09/30/2017   Procedure: FLEXIBLE SIGMOIDOSCOPY;  Surgeon: West Bali, MD;  Location: [facility];  Service: Endoscopy;  Laterality: N/A;     OB History    Gravida  1   Para  1   Term      Preterm      AB      Living        SAB      TAB      Ectopic      Multiple      Live Births              Family History  Problem Relation Age of Onset  . Asthma Mother   . Cancer  Mother   . Hypertension Mother   . Diabetes Mother   . Hypertension Father   . Diabetes Father   . Stroke Brother   . Hypertension Maternal Grandmother   . Diabetes Maternal Grandmother   . Hyperlipidemia Maternal Grandmother   . Hypertension Maternal Grandfather   . Hyperlipidemia Maternal Grandfather   . Hypertension Paternal Grandmother   . Hyperlipidemia Paternal Grandmother   . Hypertension Paternal Grandfather   . Hyperlipidemia Paternal Grandfather     Social History   Tobacco Use  . Smoking status: Current Every Day Smoker    Packs/day: 0.50    Years: 34.00    Pack years: 17.00    Types: Cigarettes  . Smokeless tobacco: Never Used  Substance Use Topics  . Alcohol use: No  . Drug use: No    Home Medications Prior to Admission medications   Medication Sig Start Date End Date Taking? Authorizing Provider  glipiZIDE (GLUCOTROL) 5 MG tablet Take 5 mg by mouth 2 (two) times daily. 03/11/20  Yes [provider]  lisinopril-hydrochlorothiazide (PRINZIDE,ZESTORETIC) 10-12.5 MG tablet Take 1 tablet by mouth daily.   Yes [provider]  metFORMIN (GLUCOPHAGE) 850 MG tablet Take 850 mg by mouth 2 (two) times daily. 03/11/20  Yes [provider]    Allergies    Patient has no known allergies.  Review of Systems   Review of Systems  Constitutional: Negative for chills and fever.  HENT: Negative for congestion, rhinorrhea and sore throat.   Eyes: Negative for visual disturbance.  Respiratory: Negative for cough and shortness of breath.   Cardiovascular: Negative for chest pain and leg swelling.  Gastrointestinal: Negative for abdominal pain, diarrhea, nausea and vomiting.  Genitourinary: Negative for dysuria.  Musculoskeletal: Negative for back pain and neck pain.  Skin: Positive for wound. Negative for rash.  Neurological: Negative for dizziness, light-headedness, numbness and headaches.  Hematological: Does not bruise/bleed easily.    Psychiatric/Behavioral: Negative for confusion.    Physical Exam Updated Vital Signs BP 133/69   Pulse (!) 101   Temp (!) 100.7 F (38.2 C) (Oral)   Resp 18   Ht 1.651 m (5\' 5" )   Wt 73.9 kg   SpO2 96%   BMI 27.12 kg/m   Physical Exam Vitals and nursing note reviewed.  Constitutional:      General: She is not in acute distress.    Appearance: Normal appearance. She is well-developed. She is not toxic-appearing.  HENT:     Head: Normocephalic and atraumatic.  Eyes:     Extraocular Movements: Extraocular movements intact.     Conjunctiva/sclera: Conjunctivae normal.     Pupils: Pupils are equal, round, and reactive to light.  Cardiovascular:     Rate and Rhythm: Regular rhythm. Tachycardia present.     Heart sounds: No murmur heard.   Pulmonary:     Effort: Pulmonary effort is normal. No respiratory distress.     Breath sounds: Normal breath sounds.  Abdominal:     Palpations: Abdomen is soft.     Tenderness: There is no abdominal tenderness.  Musculoskeletal:        General: Swelling present.     Cervical back: Neck supple.     Comments: Erythema and swelling to the left foot.  More pronounced at the forefoot area.  There is erythema to the ankle and some red streaking up the anterior part of the shin.  Dorsalis pedis pulse is 1+.  Excellent cap refill.  Good movement of the toes.  On the bottom of the foot there is a large callus area which could be an underlying wound.  That is measuring about 4 cm.  This is at the ball of the foot.  Skin:    General: Skin is warm and dry.     Capillary Refill: Capillary refill takes less than 2 seconds.  Neurological:     General: No focal deficit present.     Mental Status: She is alert and oriented to person, place, and time.     Cranial Nerves: No cranial nerve deficit.     Sensory: No sensory deficit.     Motor: No weakness.     ED Results / Procedures / Treatments   Labs (all labs ordered are listed, but only abnormal  results are displayed) Labs Reviewed  BASIC METABOLIC PANEL - Abnormal; Notable for the following components:      Result Value   Sodium 129 (*)    Chloride 92 (*)    Glucose, Bld 194 (*)    Calcium 8.8 (*)    All other components within normal limits  CBC WITH DIFFERENTIAL/PLATELET - Abnormal; Notable for the  following components:   WBC 23.4 (*)    RDW 11.2 (*)    Neutro Abs 19.9 (*)    Monocytes Absolute 1.2 (*)    Abs Immature Granulocytes 0.22 (*)    All other components within normal limits  LACTIC ACID, PLASMA - Abnormal; Notable for the following components:   Lactic Acid, Venous 2.4 (*)    All other components within normal limits  CULTURE, BLOOD (ROUTINE X 2)  CULTURE, BLOOD (ROUTINE X 2)  SARS CORONAVIRUS 2 BY RT PCR (HOSPITAL ORDER, PERFORMED IN [facility])  LACTIC ACID, PLASMA    EKG EKG Interpretation  Date/Time:  Friday March 25 2020 16:04:43 EDT Ventricular Rate:  106 PR Interval:    QRS Duration: 91 QT Interval:  332 QTC Calculation: 441 R Axis:   110 Text Interpretation: Sinus tachycardia Right axis deviation Baseline wander in lead(s) V6 No previous ECGs available Confirmed by Vanetta Mulders [phone]) on 03/25/2020 4:22:17 PM   Radiology DG Foot Complete Left  Result Date: 03/25/2020 CLINICAL DATA:  Infection, cellulitis EXAM: LEFT FOOT - COMPLETE 3+ VIEW COMPARISON:  None. FINDINGS: No fracture or malalignment. No soft tissue emphysema. Moderate calcaneal spur. Mild sclerosis and cortical thickening of the second metatarsal. Possible second digit ulcer at the level of the proximal phalanx. IMPRESSION: 1. Negative for acute fracture or bony destructive change. 2. Suspected sclerosis and cortical bone thickening of the second metatarsal, question chronic osteomyelitis; consider MRI for further evaluation given the presence of soft tissue changes at the second digit. 3. Possible soft tissue ulcer of the second digit at the level of the proximal  phalanx. Electronically Signed   By: Jasmine Pang M.D.   On: 03/25/2020 17:05    Procedures Procedures (including critical care time)  CRITICAL CARE Performed by: Vanetta Mulders Total critical care time: 35 minutes Critical care time was exclusive of separately billable procedures and treating other patients. Critical care was necessary to treat or prevent imminent or life-threatening deterioration. Critical care was time spent personally by me on the following activities: development of treatment plan with patient and/or surrogate as well as nursing, discussions with consultants, evaluation of patient's response to treatment, examination of patient, obtaining history from patient or surrogate, ordering and performing treatments and interventions, ordering and review of laboratory studies, ordering and review of radiographic studies, pulse oximetry and re-evaluation of patient's condition.   Medications Ordered in ED Medications  0.9 %  sodium chloride infusion ( Intravenous Stopped 03/25/20 1835)  sodium chloride 0.9 % bolus 500 mL (0 mLs Intravenous Stopped 03/25/20 1635)  piperacillin-tazobactam (ZOSYN) IVPB 3.375 g (0 g Intravenous Stopped 03/25/20 1635)  vancomycin (VANCOCIN) IVPB 1000 mg/200 mL premix (0 mg Intravenous Stopped 03/25/20 1737)  sodium chloride 0.9 % bolus 500 mL (500 mLs Intravenous New Bag/Given 03/25/20 1747)    ED Course  I have reviewed the triage vital signs and the nursing notes.  Pertinent labs & imaging results that were available during my care of the patient were reviewed by me and considered in my medical decision making (see chart for details).    MDM Rules/Calculators/A&P                          Patient's vital signs on presentation borderline for sepsis criteria.  T-max was 100.7.  Patient was tachycardic at 121.  Respiratory rate normal.  Blood pressure 96/59 oxygen sats 97% on room air.  Work-up x-ray shows some concerns for  osteomyelitis in the  distal forefoot where most of the swelling is.  And sort of where the underlying heavy calluses which could be an ulcer x-ray showed no evidence of any soft tissue fluid collection.  Marked leukocytosis.  Lactic acid initially elevated but less than 3.  After a liter of fluid lactic acid was 1.5.  Sodium slightly low at 129 but no critical electrolyte findings.  Blood sugar reasonable at 194.  CO2 23 no signs of acidosis.  Patient started on antibiotics for possible diabetic foot cellulitis.  So that was Zosyn and vancomycin.  Blood cultures were obtained.  Patient needs admission for IV antibiotics may require MRI of the foot to determine whether there is truly osteomyelitis or not.  Will discuss with hospitalist for admission.  Covid test ordered patient has not had any of the Covid vaccines. Final Clinical Impression(s) / ED Diagnoses Final diagnoses:  Cellulitis of left lower extremity  Osteomyelitis of left foot, unspecified type [facility])    Rx / DC Orders ED Discharge Orders    None       Vanetta Mulders, MD 03/25/20 1913  At request of hospitalist we can try squeezing the MRI of the foot so it was ordered they are here tonight.  He is planning on admitting the patient.    Vanetta Mulders, MD 03/25/20 1944

## 2020-03-25 NOTE — ED Notes (Signed)
CRITICAL VALUE ALERT  Critical Value:  lactic  Date & Time Notied:  03/25/20 @ 1630  Provider Notified: Dr. Deretha Emory  Orders Received/Actions taken: no/na

## 2020-03-25 NOTE — ED Triage Notes (Signed)
Patient comes to the ED with left lower leg pain, redness and swelling for 2 days.  Patient states there was no injury to this leg.

## 2020-03-25 NOTE — ED Notes (Signed)
Pt returned to room from MRI.

## 2020-03-25 NOTE — ED Notes (Signed)
Patient transported to MRI 

## 2020-03-25 NOTE — Progress Notes (Signed)
Pharmacy Antibiotic Note  [name] is a 59 y.o. female admitted on 03/25/2020 with cellulitis.  Pharmacy has been consulted for Vancomycin and Zosyn dosing.  Creatinine is 0.91 with an estimated crcl of 26ml/min.  Patient has received first doses between 15:30-16:30.  She has temp. And WBC of 23.4.  We will continue regimen of Vancomycin and Zosyn but may want to change antibiotic therapy to Ceftriaxone and oral Metronidazole if this is a diabetic foot ulcer.  Plan: Zosyn 3.375g IV q8h (4 hour infusion).  Vancomycin 750 mg every 12 hours.   Will aim for target trough of 10-15 mcg Monitor renal function, clinical response and plans for de-escalation.   Height: 5\' 5"  (165.1 cm) Weight: 73.9 kg (163 lb) IBW/kg (Calculated) : 57  Temp (24hrs), Avg:100.7 F (38.2 C), Min:100.7 F (38.2 C), Max:100.7 F (38.2 C)  Recent Labs  Lab 03/25/20 1531 03/25/20 1714  WBC 23.4*  --   CREATININE 0.91  --   LATICACIDVEN 2.4* 1.5    Estimated Creatinine Clearance: 67.9 mL/min (by C-G formula based on SCr of 0.91 mg/dL).    No Known Allergies  Antimicrobials this admission: 6/25 Vancomycin >> 6/25 Zosyn >>  Dose adjustments this admission:   Microbiology results: 6/25 BCx:   7/25, PharmD., MS Clinical Pharmacist Pager:  [phone] Thank you for allowing pharmacy to be part of this patients care team. 03/25/2020 8:57 PM

## 2020-03-25 NOTE — ED Notes (Signed)
Pt provided [employer].

## 2020-03-25 NOTE — H&P (Signed)
TRH H&P    Patient Demographics:    [name], is a 59 y.o. female  MRN: [id number]  DOB - [birth year]  Admit Date - 03/25/2020  Referring MD/NP/PA: Dr. Bobby Rumpf  Outpatient Primary MD for the patient is Rosita Fire, MD  Patient coming from: Home  Chief complaint-left foot swelling   HPI:    [name]  is a 59 y.o. female, with history of hypertension, diabetes mellitus type 2 presented to the ED with complaints of redness and swelling of left foot.  Patient denies any injury.  She is a poor historian.  Denies fever or chills.  Denies nausea vomiting or diarrhea.  Denies chest pain or shortness of breath.  Denies abdominal pain or dysuria. In the ED, x-ray of the foot showed suspected sclerosis and cortical bone thickening of second metatarsal question chronic osteomyelitis.  Recommended MRI of the foot. Patient started on vancomycin and Zosyn for osteomyelitis. WBC 23.4, lactic acid initially 2.4, repeat lactic acid 1.5   Review of systems:    In addition to the HPI above,    All other systems reviewed and are negative.    Past History of the following :    Past Medical History:  Diagnosis Date  . Diabetes mellitus without complication ([place])   . Hidradenitis 12/13/2015  . Hypercholesterolemia   . Hypertension   . Smoker 12/13/2015      Past Surgical History:  Procedure Laterality Date  . FLEXIBLE SIGMOIDOSCOPY N/A 09/30/2017   Procedure: FLEXIBLE SIGMOIDOSCOPY;  Surgeon: Danie Binder, MD;  Location: [facility];  Service: Endoscopy;  Laterality: N/A;      Social History:      Social History   Tobacco Use  . Smoking status: Current Every Day Smoker    Packs/day: 0.50    Years: 34.00    Pack years: 17.00    Types: Cigarettes  . Smokeless tobacco: Never Used  Substance Use Topics  . Alcohol use: No       Family History :     Family History  Problem  Relation Age of Onset  . Asthma Mother   . Cancer Mother   . Hypertension Mother   . Diabetes Mother   . Hypertension Father   . Diabetes Father   . Stroke Brother   . Hypertension Maternal Grandmother   . Diabetes Maternal Grandmother   . Hyperlipidemia Maternal Grandmother   . Hypertension Maternal Grandfather   . Hyperlipidemia Maternal Grandfather   . Hypertension Paternal Grandmother   . Hyperlipidemia Paternal Grandmother   . Hypertension Paternal Grandfather   . Hyperlipidemia Paternal Grandfather       Home Medications:   Prior to Admission medications   Medication Sig Start Date End Date Taking? Authorizing Provider  glipiZIDE (GLUCOTROL) 5 MG tablet Take 5 mg by mouth 2 (two) times daily. 03/11/20  Yes [provider]  lisinopril-hydrochlorothiazide (PRINZIDE,ZESTORETIC) 10-12.5 MG tablet Take 1 tablet by mouth daily.   Yes [provider]  metFORMIN (GLUCOPHAGE) 850 MG tablet Take 850 mg by mouth 2 (  two) times daily. 03/11/20  Yes [provider]     Allergies:    No Known Allergies   Physical Exam:   Vitals  Blood pressure 134/73, pulse (!) 111, temperature (!) 100.7 F (38.2 C), temperature source Oral, resp. rate (!) 22, height 5' 5"  (1.651 m), weight 73.9 kg, SpO2 95 %.  1.  General: Appears in no acute distress  2. Psychiatric: Alert, oriented x3, intact insight and judgment  3. Neurologic: Cranial nerves II through XII grossly intact, no focal deficit noted  4. HEENMT:  Atraumatic normocephalic, extraocular muscles are intact  5. Respiratory : Clear to auscultation bilaterally, no wheezing or crackles auscultated  6. Cardiovascular : S1-S2, regular, no murmur auscultated  7. Gastrointestinal:  Abdomen is soft, nontender, no organomegaly  8. Skin:  Marked erythema noted on the dorsum of left foot, warm to touch, nontender to palpation, peripheral pulses posterior tibial 2+, dorsalis pedis 2+      Data Review:      CBC Recent Labs  Lab 03/25/20 1531  WBC 23.4*  HGB 13.3  HCT 39.1  PLT 224  MCV 94.2  MCH 32.0  MCHC 34.0  RDW 11.2*  LYMPHSABS 2.0  MONOABS 1.2*  EOSABS 0.0  BASOSABS 0.0   ------------------------------------------------------------------------------------------------------------------  Results for orders placed or performed during the hospital encounter of 03/25/20 (from the past 48 hour(s))  Basic metabolic panel     Status: Abnormal   Collection Time: 03/25/20  3:31 PM  Result Value Ref Range   Sodium 129 (L) 135 - 145 mmol/L   Potassium 3.7 3.5 - 5.1 mmol/L   Chloride 92 (L) 98 - 111 mmol/L   CO2 23 22 - 32 mmol/L   Glucose, Bld 194 (H) 70 - 99 mg/dL    Comment: Glucose reference range applies only to samples taken after fasting for at least 8 hours.   BUN 17 6 - 20 mg/dL   Creatinine, Ser 0.91 0.44 - 1.00 mg/dL   Calcium 8.8 (L) 8.9 - 10.3 mg/dL   GFR calc non Af Amer >60 >60 mL/min   GFR calc Af Amer >60 >60 mL/min   Anion gap 14 5 - 15    Comment: Performed at [facility], [address], [place], [place] [address]  CBC with Differential/Platelet     Status: Abnormal   Collection Time: 03/25/20  3:31 PM  Result Value Ref Range   WBC 23.4 (H) 4.0 - 10.5 K/uL   RBC 4.15 3.87 - 5.11 MIL/uL   Hemoglobin 13.3 12.0 - 15.0 g/dL   HCT 39.1 36 - 46 %   MCV 94.2 80.0 - 100.0 fL   MCH 32.0 26.0 - 34.0 pg   MCHC 34.0 30.0 - 36.0 g/dL   RDW 11.2 (L) 11.5 - 15.5 %   Platelets 224 150 - 400 K/uL   nRBC 0.0 0.0 - 0.2 %   Neutrophils Relative % 85 %   Neutro Abs 19.9 (H) 1.7 - 7.7 K/uL   Lymphocytes Relative 9 %   Lymphs Abs 2.0 0.7 - 4.0 K/uL   Monocytes Relative 5 %   Monocytes Absolute 1.2 (H) 0 - 1 K/uL   Eosinophils Relative 0 %   Eosinophils Absolute 0.0 0 - 0 K/uL   Basophils Relative 0 %   Basophils Absolute 0.0 0 - 0 K/uL   Immature Granulocytes 1 %   Abs Immature Granulocytes 0.22 (H) 0.00 - 0.07 K/uL    Comment: Performed at [facility],  [address], [place], [place] [address]  Lactic acid, plasma     Status: Abnormal   Collection Time: 03/25/20  3:31 PM  Result Value Ref Range   Lactic Acid, Venous 2.4 (HH) 0.5 - 1.9 mmol/L    Comment: CRITICAL RESULT CALLED TO, READ BACK BY AND VERIFIED WITH: TALBOTT,T ON 03/25/20 AT 1625 BY LOY,C Performed at [facility], [address], [place], [place] [address]   Lactic acid, plasma     Status: None   Collection Time: 03/25/20  5:14 PM  Result Value Ref Range   Lactic Acid, Venous 1.5 0.5 - 1.9 mmol/L    Comment: Performed at [facility], [address] [place],  [address]    Chemistries  Recent Labs  Lab 03/25/20 1531  NA 129*  K 3.7  CL 92*  CO2 23  GLUCOSE 194*  BUN 17  CREATININE 0.91  CALCIUM 8.8*   ------------------------------------------------------------------------------------------------------------------  ------------------------------------------------------------------------------------------------------------------ GFR: Estimated Creatinine Clearance: 67.9 mL/min (by C-G formula based on SCr of 0.91 mg/dL). Liver Function Tests: No results for input(s): AST, ALT, ALKPHOS, BILITOT, PROT, ALBUMIN in the last 168 hours. No results for input(s): LIPASE, AMYLASE in the last 168 hours. No results for input(s): AMMONIA in the last 168 hours. Coagulation Profile: No results for input(s): INR, PROTIME in the last 168 hours. Cardiac Enzymes: No results for input(s): CKTOTAL, CKMB, CKMBINDEX, TROPONINI in the last 168 hours. BNP (last 3 results) No results for input(s): PROBNP in the last 8760 hours. HbA1C: No results for input(s): HGBA1C in the last 72 hours. CBG: No results for input(s): GLUCAP in the last 168 hours. Lipid Profile: No results for input(s): CHOL, HDL, LDLCALC, TRIG, CHOLHDL, LDLDIRECT in the last 72 hours. Thyroid Function Tests: No results for input(s): TSH, T4TOTAL, FREET4, T3FREE, THYROIDAB in the last 72 hours. Anemia Panel: No  results for input(s): VITAMINB12, FOLATE, FERRITIN, TIBC, IRON, RETICCTPCT in the last 72 hours.  --------------------------------------------------------------------------------------------------------------- Urine analysis:    Component Value Date/Time   COLORURINE YELLOW 11/14/2017 [place] 11/14/2017 0355   LABSPEC 1.008 11/14/2017 0355   PHURINE 6.0 11/14/2017 0355   GLUCOSEU 50 (A) 11/14/2017 0355   HGBUR NEGATIVE 11/14/2017 0355   BILIRUBINUR NEGATIVE 11/14/2017 0355   KETONESUR NEGATIVE 11/14/2017 0355   PROTEINUR NEGATIVE 11/14/2017 0355   UROBILINOGEN 0.2 06/23/2013 1750   NITRITE NEGATIVE 11/14/2017 0355   LEUKOCYTESUR MODERATE (A) 11/14/2017 0355      Imaging Results:    DG Foot Complete Left  Result Date: 03/25/2020 CLINICAL DATA:  Infection, cellulitis EXAM: LEFT FOOT - COMPLETE 3+ VIEW COMPARISON:  None. FINDINGS: No fracture or malalignment. No soft tissue emphysema. Moderate calcaneal spur. Mild sclerosis and cortical thickening of the second metatarsal. Possible second digit ulcer at the level of the proximal phalanx. IMPRESSION: 1. Negative for acute fracture or bony destructive change. 2. Suspected sclerosis and cortical bone thickening of the second metatarsal, question chronic osteomyelitis; consider MRI for further evaluation given the presence of soft tissue changes at the second digit. 3. Possible soft tissue ulcer of the second digit at the level of the proximal phalanx. Electronically Signed   By: Donavan Foil M.D.   On: 03/25/2020 17:05    My personal review of EKG: Rhythm NSR, no ST changes   Assessment & Plan:    Active Problems:   Osteomyelitis ([place])   1. Osteomyelitis of left foot-x-ray of left foot shows possibility of chronic osteomyelitis, she does have erythema and warmth on dorsum  of left foot.  We will start her on vancomycin and Zosyn.  Obtain ESR and CRP.  MRI of left foot has been ordered by ED physician.  Consider  orthopedics consultation depending on results of MRI. 2. Diabetes mellitus type 2-hold oral hypoglycemic agents, start sliding scale insulin with NovoLog.  Check CBG before every meal and at bedtime.  Check hemoglobin A1c. 3. Hypertension-hold HCTZ due to hyponatremia, blood pressure is stable.  Continue with lisinopril 10 mg daily. 4. Hyponatremia-sodium 129, mild likely from diuretics.  Hold HCTZ.  Patient started on gentle IV hydration with normal saline.  Follow BMP in a.m.    DVT Prophylaxis-   Lovenox   AM Labs Ordered, also please review Full Orders  Family Communication: Admission, patients condition and plan of care including tests being ordered have been discussed with the patient  who indicate understanding and agree with the plan and Code Status.  Code Status: Full code  Admission status: Observation/Inpatient :The appropriate admission status for this patient is INPATIENT. Inpatient status is judged to be reasonable and necessary in order to provide the required intensity of service to ensure the patient's safety. The patient's presenting symptoms, physical exam findings, and initial radiographic and laboratory data in the context of their chronic comorbidities is felt to place them at high risk for further clinical deterioration. Furthermore, it is not anticipated that the patient will be medically stable for discharge from the hospital within 2 midnights of admission. The following factors support the admission status of inpatient.     The patient's presenting symptoms include left foot redness and swelling. The worrisome physical exam findings include erythema of left foot. The initial radiographic and laboratory data are worrisome because of osteomyelitis of foot. The chronic co-morbidities include diabetes mellitus type 2.       * I certify that at the point of admission it is my clinical judgment that the patient will require inpatient hospital care spanning beyond 2  midnights from the point of admission due to high intensity of service, high risk for further deterioration and high frequency of surveillance required.*  Time spent in minutes : 60 minutes   Treylen Gibbs S Aneeka Bowden M.D

## 2020-03-26 DIAGNOSIS — L03119 Cellulitis of unspecified part of limb: Secondary | ICD-10-CM

## 2020-03-26 DIAGNOSIS — E785 Hyperlipidemia, unspecified: Secondary | ICD-10-CM

## 2020-03-26 DIAGNOSIS — F17200 Nicotine dependence, unspecified, uncomplicated: Secondary | ICD-10-CM

## 2020-03-26 DIAGNOSIS — L03116 Cellulitis of left lower limb: Secondary | ICD-10-CM

## 2020-03-26 DIAGNOSIS — E1142 Type 2 diabetes mellitus with diabetic polyneuropathy: Secondary | ICD-10-CM | POA: Diagnosis present

## 2020-03-26 DIAGNOSIS — I10 Essential (primary) hypertension: Secondary | ICD-10-CM

## 2020-03-26 DIAGNOSIS — L02619 Cutaneous abscess of unspecified foot: Secondary | ICD-10-CM

## 2020-03-26 LAB — GLUCOSE, CAPILLARY
Glucose-Capillary: 168 mg/dL — ABNORMAL HIGH (ref 70–99)
Glucose-Capillary: 467 mg/dL — ABNORMAL HIGH (ref 70–99)
Glucose-Capillary: 139 mg/dL — ABNORMAL HIGH (ref 70–99)
Glucose-Capillary: 190 mg/dL — ABNORMAL HIGH (ref 70–99)

## 2020-03-26 LAB — COMPREHENSIVE METABOLIC PANEL
ALT: 9 U/L (ref 0–44)
AST: 9 U/L — ABNORMAL LOW (ref 15–41)
Albumin: 3.1 g/dL — ABNORMAL LOW (ref 3.5–5.0)
Alkaline Phosphatase: 111 U/L (ref 38–126)
Anion gap: 11 (ref 5–15)
BUN: 14 mg/dL (ref 6–20)
CO2: 26 mmol/L (ref 22–32)
Calcium: 8.5 mg/dL — ABNORMAL LOW (ref 8.9–10.3)
Chloride: 97 mmol/L — ABNORMAL LOW (ref 98–111)
Creatinine, Ser: 0.9 mg/dL (ref 0.44–1.00)
GFR calc Af Amer: >60 mL/min (ref >60)
GFR calc non Af Amer: >60 mL/min (ref >60)
Glucose, Bld: 176 mg/dL — ABNORMAL HIGH (ref 70–99)
Potassium: 3.6 mmol/L (ref 3.5–5.1)
Sodium: 134 mmol/L — ABNORMAL LOW (ref 135–145)
Total Bilirubin: 0.8 mg/dL (ref 0.3–1.2)
Total Protein: 6.4 g/dL — ABNORMAL LOW (ref 6.5–8.1)

## 2020-03-26 LAB — HEMOGLOBIN A1C
Hgb A1c MFr Bld: 8 % — ABNORMAL HIGH (ref 4.8–5.6)
Mean Plasma Glucose: 182.9 mg/dL

## 2020-03-26 LAB — CBC
HCT: 35.5 % — ABNORMAL LOW (ref 36.0–46.0)
Hemoglobin: 12.2 g/dL (ref 12.0–15.0)
MCH: 32.4 pg (ref 26.0–34.0)
MCHC: 34.4 g/dL (ref 30.0–36.0)
MCV: 94.4 fL (ref 80.0–100.0)
Platelets: 207 10*3/uL (ref 150–400)
RBC: 3.76 MIL/uL — ABNORMAL LOW (ref 3.87–5.11)
RDW: 11.4 % — ABNORMAL LOW (ref 11.5–15.5)
WBC: 16.7 10*3/uL — ABNORMAL HIGH (ref 4.0–10.5)
nRBC: 0 % (ref 0.0–0.2)

## 2020-03-26 LAB — HIV ANTIBODY (ROUTINE TESTING W REFLEX): HIV Screen 4th Generation wRfx: NONREACTIVE

## 2020-03-26 LAB — GLUCOSE, RANDOM: Glucose, Bld: 288 mg/dL — ABNORMAL HIGH (ref 70–99)

## 2020-03-26 MED ORDER — INSULIN ASPART 100 UNIT/ML ~~LOC~~ SOLN: 0.0000 [IU] | Freq: Every day | SUBCUTANEOUS | Status: DC

## 2020-03-26 MED ORDER — NICOTINE 21 MG/24HR TD PT24
21.0000 mg | MEDICATED_PATCH | Freq: Every day | TRANSDERMAL | Status: DC
  Administered 2020-03-26 – 2020-03-28 (×2): 21 mg via TRANSDERMAL
  Filled 2020-03-26 (×3): qty 1

## 2020-03-26 MED ORDER — INSULIN ASPART 100 UNIT/ML ~~LOC~~ SOLN
4.0000 [IU] | Freq: Three times a day (TID) | SUBCUTANEOUS | Status: DC
  Administered 2020-03-26 – 2020-03-28 (×7): 4 [IU] via SUBCUTANEOUS

## 2020-03-26 MED ORDER — INSULIN ASPART 100 UNIT/ML ~~LOC~~ SOLN
0.0000 [IU] | Freq: Three times a day (TID) | SUBCUTANEOUS | Status: DC
  Administered 2020-03-26: 2 [IU] via SUBCUTANEOUS
  Administered 2020-03-26: 8 [IU] via SUBCUTANEOUS
  Administered 2020-03-27 (×2): 3 [IU] via SUBCUTANEOUS
  Administered 2020-03-27: 2 [IU] via SUBCUTANEOUS
  Administered 2020-03-28: 3 [IU] via SUBCUTANEOUS
  Administered 2020-03-28: 5 [IU] via SUBCUTANEOUS

## 2020-03-26 MED ORDER — INSULIN ASPART 100 UNIT/ML ~~LOC~~ SOLN: 10.0000 [IU] | Freq: Once | SUBCUTANEOUS | Status: DC

## 2020-03-26 MED ORDER — INSULIN GLARGINE 100 UNIT/ML ~~LOC~~ SOLN
10.0000 [IU] | Freq: Every day | SUBCUTANEOUS | Status: DC
  Administered 2020-03-26 – 2020-03-28 (×3): 10 [IU] via SUBCUTANEOUS
  Filled 2020-03-26 (×4): qty 0.1

## 2020-03-26 NOTE — Plan of Care (Signed)

## 2020-03-26 NOTE — Progress Notes (Signed)
PROGRESS NOTE   [name]  WNI:[id number] DOB: 1961/08/20 DOA: 03/25/2020 PCP: Avon Gully, MD   Chief Complaint  Patient presents with  . Leg Swelling    left    Brief Admission History:  59 y.o. female, with history of hypertension, diabetes mellitus type 2 presented to the ED with complaints of redness and swelling of left foot.  Patient denies any injury.  She is a poor historian.  Denies fever or chills.  Denies nausea vomiting or diarrhea.  Denies chest pain or shortness of breath.  Denies abdominal pain or dysuria.  In the ED, x-ray of the foot showed suspected sclerosis and cortical bone thickening of second metatarsal question chronic osteomyelitis.  Recommended MRI of the foot.  Patient started on vancomycin and Zosyn for osteomyelitis.  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 Cellulitis LLE/left foot abscess - MRI with no specific findings of osteomyelitis.  I consulted surgery and Dr. Lovell Sheehan was able to do bedside I&D 6/25 with lots of purulent drainage and sent wound culture sample. Continue broad spectrum antibiotics and anaerobic coverage until we get culture data.  Working on improving glycemic control.  2. 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 A1c 8.0 is evidence of poorly controlled disease.  She is managed with oral diabetes medications at home.  In hospital she is being managed with lantus 10 units, novolog 4 units TIDWC plus mod SSI, CBG 5 times per day.   3. Leukocytosis - WBC trending down nicely with treatments.   4. Tobacco - strongly advised  against continued smoking as she is HIGH RISK for limb loss given her poorly controlled diabetes. She requested nicotine patch which was ordered.   DVT prophylaxis: lovenox  Code Status:  Full  Family Communication:  Disposition:   Status is: Inpatient  Remains inpatient appropriate because:IV treatments appropriate due to intensity of illness or inability to take PO and Inpatient level of care appropriate due to severity of illness   Dispo: The patient is from: Home              Anticipated d/c is to: Home              Anticipated d/c date is: 3 days              Patient currently is not medically stable to d/c.  Consultants:   Surgery   Procedures:   I&D 6/25 (Dr Lovell Sheehan)  Antimicrobials:  Zosyn 6/25>> Vancomycin 6/25>>  Subjective: Pt reports she is having nicotine withdrawal, she is a heavy smoker, asking for a nicotine patch. She tolerated the I&D this morning.   Objective: Vitals:   03/25/20 2307 03/26/20 0317 03/26/20 0714 03/26/20 0810  BP: 103/90 124/61 (!) 108/57 124/72  Pulse: (!) 109 (!) 109 (!) 102   Resp: 16 16 16    Temp: 99.8 F (37.7 C) 99.8 F (37.7 C) 98.1 F (36.7 C)   TempSrc: Oral Oral Oral   SpO2: 95% 90% 94%   Weight:      Height:  Intake/Output Summary (Last 24 hours) at 03/26/2020 1148 Last data filed at 03/26/2020 0600 Gross per 24 hour  Intake 1000.81 ml  Output --  Net 1000.81 ml   Filed Weights   03/25/20 1414 03/25/20 2148  Weight: 73.9 kg 74.8 kg    Examination:  General exam: Appears calm and comfortable  Respiratory system: Clear to auscultation. Respiratory effort normal. Cardiovascular system: S1 & S2 heard, RRR. No JVD, murmurs, rubs, gallops or clicks. No pedal edema. Gastrointestinal system: Abdomen is nondistended, soft and nontender. No organomegaly or masses felt. Normal bowel sounds heard. Central nervous system: Alert and oriented. No focal neurological deficits. Extremities: Symmetric 5 x 5  power. Skin: No rashes, lesions or ulcers Psychiatry: Judgement and insight appear normal. Mood & affect appropriate.   Data Reviewed: I have personally reviewed following labs and imaging studies  CBC: Recent Labs  Lab 03/25/20 1531 03/26/20 0635  WBC 23.4* 16.7*  NEUTROABS 19.9*  --   HGB 13.3 12.2  HCT 39.1 35.5*  MCV 94.2 94.4  PLT 224 207    Basic Metabolic Panel: Recent Labs  Lab 03/25/20 1531 03/26/20 0635  NA 129* 134*  K 3.7 3.6  CL 92* 97*  CO2 23 26  GLUCOSE 194* 176*  BUN 17 14  CREATININE 0.91 0.90  CALCIUM 8.8* 8.5*    GFR: Estimated Creatinine Clearance: 68.9 mL/min (by C-G formula based on SCr of 0.9 mg/dL).  Liver Function Tests: Recent Labs  Lab 03/26/20 0635  AST 9*  ALT 9  ALKPHOS 111  BILITOT 0.8  PROT 6.4*  ALBUMIN 3.1*    CBG: Recent Labs  Lab 03/25/20 2305 03/26/20 0756  GLUCAP 220* 168*    Recent Results (from the past 240 hour(s))  Culture, blood (Routine X 2) w Reflex to ID Panel     Status: None (Preliminary result)   Collection Time: 03/25/20  3:37 PM   Specimen: BLOOD RIGHT FOREARM  Result Value Ref Range Status   Specimen Description BLOOD RIGHT FOREARM  Final   Special Requests   Final    BOTTLES DRAWN AEROBIC AND ANAEROBIC Blood Culture adequate volume   Culture   Final    NO GROWTH < 24 HOURS Performed at [facility], [address] [place], [place] [address]    Report Status PENDING  Incomplete  Culture, blood (Routine X 2) w Reflex to ID Panel     Status: None (Preliminary result)   Collection Time: 03/25/20  3:37 PM   Specimen: Right Antecubital; Blood  Result Value Ref Range Status   Specimen Description RIGHT ANTECUBITAL  Final   Special Requests   Final    BOTTLES DRAWN AEROBIC AND ANAEROBIC Blood Culture adequate volume   Culture   Final    NO GROWTH < 24 HOURS Performed at [facility], [address], [place], [place] [address]    Report Status PENDING  Incomplete  SARS Coronavirus 2 by RT PCR  (hospital order, performed in [facility] hospital lab) Nasopharyngeal Nasopharyngeal Swab     Status: None   Collection Time: 03/25/20  7:10 PM   Specimen: Nasopharyngeal Swab  Result Value Ref Range Status   SARS Coronavirus 2 NEGATIVE NEGATIVE Final    Comment: (NOTE) SARS-CoV-2 target nucleic acids are NOT DETECTED.  The SARS-CoV-2 RNA is generally detectable in upper and lower respiratory specimens during the acute phase of infection. The lowest concentration of SARS-CoV-2 viral copies this assay can detect is 250 copies / mL. A negative result  does not preclude SARS-CoV-2 infection and should not be used as the sole basis for treatment or other patient management decisions.  A negative result may occur with improper specimen collection / handling, submission of specimen other than nasopharyngeal swab, presence of viral mutation(s) within the areas targeted by this assay, and inadequate number of viral copies (<250 copies / mL). A negative result must be combined with clinical observations, patient history, and epidemiological information.  Fact Sheet for Patients:   BoilerBrush.com.cy  Fact Sheet for Healthcare Providers: https://pope.com/  This test is not yet approved or  cleared by the [place] FDA and has been authorized for detection and/or diagnosis of SARS-CoV-2 by FDA under an Emergency Use Authorization (EUA).  This EUA will remain in effect (meaning this test can be used) for the duration of the COVID-19 declaration under Section 564(b)(1) of the Act, 21 U.S.C. section 360bbb-3(b)(1), unless the authorization is terminated or revoked sooner.  Performed at [facility], [address] [place], [place] [address]      Radiology Studies: MR FOOT LEFT W WO CONTRAST  Result Date: 03/25/2020 CLINICAL DATA:  Left foot erythema and swelling for several days. Second toe infection. EXAM: MRI OF THE LEFT FOREFOOT WITHOUT  AND WITH CONTRAST TECHNIQUE: Multiplanar, multisequence MR imaging of the left forefoot was performed both before and after administration of intravenous contrast. CONTRAST:  7.54mL GADAVIST GADOBUTROL 1 MMOL/ML IV SOLN COMPARISON:  Left foot radiographs 03/25/2020 and 06/23/2013. Left foot MRI 04/02/2015. FINDINGS: Despite efforts by the technologist and patient, mild motion artifact is present on today's exam and could not be eliminated. This reduces exam sensitivity and specificity. Bones/Joint/Cartilage The 2nd and 3rd toes are splayed by a large complex fluid collection in the 2nd web space. There is a small effusion of the 2nd metatarsophalangeal joint with mild synovial enhancement following contrast. No cortical destruction, marrow edema or suspicious marrow enhancement is seen to confirm osteomyelitis. The alignment is normal at the Lisfranc joint. Ligaments Intact Lisfranc ligament. Muscles and Tendons Forefoot muscular atrophy and increased T2 signal attributed to chronic myopathy. No tendon rupture or significant tenosynovitis. Soft tissues Large area of soft tissue ulceration along the plantar aspects of the 2nd and 3rd metatarsals with poor enhancement following contrast consistent with devitalized tissue. There is a large complex fluid collection extending into the 2nd web space, measuring 3.3 x 1.7 x 2.0 cm. There is diffuse enhancement in the surrounding soft tissues following contrast. No other focal fluid collections. IMPRESSION: 1. Large area of soft tissue ulceration along the plantar aspects of the 2nd and 3rd metatarsals with poor enhancement following contrast consistent with devitalized tissue. Large complex fluid collection in the 2nd web space consistent with an abscess. 2. Small effusion of the 2nd metatarsophalangeal joint with mild synovial enhancement following contrast, potentially septic arthritis. No specific evidence of osteomyelitis. 3. The 2nd and 3rd toes are splayed by the  complex fluid collection in the 2nd web space. Electronically Signed   By: Carey Bullocks M.D.   On: 03/25/2020 20:57   DG Foot Complete Left  Result Date: 03/25/2020 CLINICAL DATA:  Infection, cellulitis EXAM: LEFT FOOT - COMPLETE 3+ VIEW COMPARISON:  None. FINDINGS: No fracture or malalignment. No soft tissue emphysema. Moderate calcaneal spur. Mild sclerosis and cortical thickening of the second metatarsal. Possible second digit ulcer at the level of the proximal phalanx. IMPRESSION: 1. Negative for acute fracture or bony destructive change. 2. Suspected sclerosis and cortical bone thickening of the second metatarsal, question  chronic osteomyelitis; consider MRI for further evaluation given the presence of soft tissue changes at the second digit. 3. Possible soft tissue ulcer of the second digit at the level of the proximal phalanx. Electronically Signed   By: Donavan Foil M.D.   On: 03/25/2020 17:05   Scheduled Meds: . enoxaparin (LOVENOX) injection  40 mg Subcutaneous Q24H  . insulin aspart  0-9 Units Subcutaneous TID WC  . lisinopril  10 mg Oral Daily  . nicotine  21 mg Transdermal Daily   Continuous Infusions: . sodium chloride 75 mL/hr at 03/25/20 2259  . piperacillin-tazobactam (ZOSYN)  IV 3.375 g (03/26/20 0744)  . vancomycin 750 mg (03/26/20 0430)     LOS: 1 day   Time spent: 21 mins    Shykeria Sakamoto Wynetta Emery, MD How to contact the [facility] Attending or Consulting provider [place] or covering provider during after hours [place], for this patient?  1. Check the care team in [facility] and look for a) attending/consulting [facility] provider listed and b) the [facility] team listed 2. Log into [email or web address] and use [place] universal password to access. If you do not have the password, please contact the hospital operator. 3. Locate the [facility] provider you are looking for under [facility] and page to a number that you can be directly reached. 4. If you still have difficulty reaching the provider,  please page the [facility] (Director on Call) for the Hospitalists listed on amion for assistance.  03/26/2020, 11:48 AM

## 2020-03-26 NOTE — Consult Note (Signed)
Reason for Consult: Abscess, left foot Referring Physician: Dr. Daneil Dolin is an 59 y.o. female.  HPI: Patient is a 59 year old Y female who presented to the emergency room with worsening pain and swelling in the left foot.  She was noted to have cellulitis of left foot and underwent an MRI.  This revealed a large abscess in between the second and third toes in the left foot.  She also has a callus with a wound present along the plantar surface.  She was admitted to the hospital for IV antibiotics.  She has never seen a podiatrist.  She does have diabetes mellitus.  Past Medical History:  Diagnosis Date  . Diabetes mellitus without complication (HCC)   . Hidradenitis 12/13/2015  . Hypercholesterolemia   . Hypertension   . Smoker 12/13/2015    Past Surgical History:  Procedure Laterality Date  . FLEXIBLE SIGMOIDOSCOPY N/A 09/30/2017   Procedure: FLEXIBLE SIGMOIDOSCOPY;  Surgeon: West Bali, MD;  Location: [facility];  Service: Endoscopy;  Laterality: N/A;    Family History  Problem Relation Age of Onset  . Asthma Mother   . Cancer Mother   . Hypertension Mother   . Diabetes Mother   . Hypertension Father   . Diabetes Father   . Stroke Brother   . Hypertension Maternal Grandmother   . Diabetes Maternal Grandmother   . Hyperlipidemia Maternal Grandmother   . Hypertension Maternal Grandfather   . Hyperlipidemia Maternal Grandfather   . Hypertension Paternal Grandmother   . Hyperlipidemia Paternal Grandmother   . Hypertension Paternal Grandfather   . Hyperlipidemia Paternal Grandfather     Social History:  reports that she has been smoking cigarettes. She has a 17.00 pack-year smoking history. She has never used smokeless tobacco. She reports that she does not drink alcohol and does not use drugs.  Allergies: No Known Allergies  Medications:  I have reviewed the patient's current medications. Scheduled: . enoxaparin (LOVENOX) injection  40 mg  Subcutaneous Q24H  . insulin aspart  0-9 Units Subcutaneous TID WC  . lisinopril  10 mg Oral Daily    Results for orders placed or performed during the hospital encounter of 03/25/20 (from the past 48 hour(s))  Basic metabolic panel     Status: Abnormal   Collection Time: 03/25/20  3:31 PM  Result Value Ref Range   Sodium 129 (L) 135 - 145 mmol/L   Potassium 3.7 3.5 - 5.1 mmol/L   Chloride 92 (L) 98 - 111 mmol/L   CO2 23 22 - 32 mmol/L   Glucose, Bld 194 (H) 70 - 99 mg/dL    Comment: Glucose reference range applies only to samples taken after fasting for at least 8 hours.   BUN 17 6 - 20 mg/dL   Creatinine, Ser 3.42 0.44 - 1.00 mg/dL   Calcium 8.8 (L) 8.9 - 10.3 mg/dL   GFR calc non Af Amer >60 >60 mL/min   GFR calc Af Amer >60 >60 mL/min   Anion gap 14 5 - 15    Comment: Performed at [facility], [address] [place], [place] [address]  CBC with Differential/Platelet     Status: Abnormal   Collection Time: 03/25/20  3:31 PM  Result Value Ref Range   WBC 23.4 (H) 4.0 - 10.5 K/uL   RBC 4.15 3.87 - 5.11 MIL/uL   Hemoglobin 13.3 12.0 - 15.0 g/dL   HCT 15.7 36 - 46 %   MCV 94.2 80.0 - 100.0 fL  MCH 32.0 26.0 - 34.0 pg   MCHC 34.0 30.0 - 36.0 g/dL   RDW 11.2 (L) 11.5 - 15.5 %   Platelets 224 150 - 400 K/uL   nRBC 0.0 0.0 - 0.2 %   Neutrophils Relative % 85 %   Neutro Abs 19.9 (H) 1.7 - 7.7 K/uL   Lymphocytes Relative 9 %   Lymphs Abs 2.0 0.7 - 4.0 K/uL   Monocytes Relative 5 %   Monocytes Absolute 1.2 (H) 0 - 1 K/uL   Eosinophils Relative 0 %   Eosinophils Absolute 0.0 0 - 0 K/uL   Basophils Relative 0 %   Basophils Absolute 0.0 0 - 0 K/uL   Immature Granulocytes 1 %   Abs Immature Granulocytes 0.22 (H) 0.00 - 0.07 K/uL    Comment: Performed at [facility], [address], [place], [place] [address]  Lactic acid, plasma     Status: Abnormal   Collection Time: 03/25/20  3:31 PM  Result Value Ref Range   Lactic Acid, Venous 2.4 (HH) 0.5 - 1.9 mmol/L    Comment:  CRITICAL RESULT CALLED TO, READ BACK BY AND VERIFIED WITH: TALBOTT,T ON 03/25/20 AT 1625 BY LOY,C Performed at [facility], [address], [place], [place] [address]   C-reactive protein     Status: Abnormal   Collection Time: 03/25/20  3:35 PM  Result Value Ref Range   CRP 17.5 (H) <1.0 mg/dL    Comment: Performed at [facility], [address], [place], [place] [address]  Sedimentation rate     Status: Abnormal   Collection Time: 03/25/20  3:35 PM  Result Value Ref Range   Sed Rate 50 (H) 0 - 22 mm/hr    Comment: Performed at [facility], [address] [place], [place] [address]  Culture, blood (Routine X 2) w Reflex to ID Panel     Status: None (Preliminary result)   Collection Time: 03/25/20  3:37 PM   Specimen: BLOOD RIGHT FOREARM  Result Value Ref Range   Specimen Description BLOOD RIGHT FOREARM    Special Requests      BOTTLES DRAWN AEROBIC AND ANAEROBIC Blood Culture adequate volume   Culture      NO GROWTH < 24 HOURS Performed at [facility], [address], [place], [place] [address]    Report Status PENDING   Culture, blood (Routine X 2) w Reflex to ID Panel     Status: None (Preliminary result)   Collection Time: 03/25/20  3:37 PM   Specimen: Right Antecubital; Blood  Result Value Ref Range   Specimen Description RIGHT ANTECUBITAL    Special Requests      BOTTLES DRAWN AEROBIC AND ANAEROBIC Blood Culture adequate volume   Culture      NO GROWTH < 24 HOURS Performed at [facility], [address], [place],  [address]    Report Status PENDING   Lactic acid, plasma     Status: None   Collection Time: 03/25/20  5:14 PM  Result Value Ref Range   Lactic Acid, Venous 1.5 0.5 - 1.9 mmol/L    Comment: Performed at [facility], [address] [place],  [address]  SARS Coronavirus 2 by RT PCR (hospital order, performed in [place] hospital lab) Nasopharyngeal Nasopharyngeal Swab     Status: None   Collection Time: 03/25/20  7:10 PM   Specimen:  Nasopharyngeal Swab  Result Value Ref Range   SARS Coronavirus 2 NEGATIVE NEGATIVE    Comment: (NOTE)  SARS-CoV-2 target nucleic acids are NOT DETECTED.  The SARS-CoV-2 RNA is generally detectable in upper and lower respiratory specimens during the acute phase of infection. The lowest concentration of SARS-CoV-2 viral copies this assay can detect is 250 copies / mL. A negative result does not preclude SARS-CoV-2 infection and should not be used as the sole basis for treatment or other patient management decisions.  A negative result may occur with improper specimen collection / handling, submission of specimen other than nasopharyngeal swab, presence of viral mutation(s) within the areas targeted by this assay, and inadequate number of viral copies (<250 copies / mL). A negative result must be combined with clinical observations, patient history, and epidemiological information.  Fact Sheet for Patients:   BoilerBrush.com.cy  Fact Sheet for Healthcare Providers: https://pope.com/  This test is not yet approved or  cleared by the [place] FDA and has been authorized for detection and/or diagnosis of SARS-CoV-2 by FDA under an Emergency Use Authorization (EUA).  This EUA will remain in effect (meaning this test can be used) for the duration of the COVID-19 declaration under Section 564(b)(1) of the Act, 21 U.S.C. section 360bbb-3(b)(1), unless the authorization is terminated or revoked sooner.  Performed at [facility], [address] [place], [place] [address]   Glucose, capillary     Status: Abnormal   Collection Time: 03/25/20 11:05 PM  Result Value Ref Range   Glucose-Capillary 220 (H) 70 - 99 mg/dL    Comment: Glucose reference range applies only to samples taken after fasting for at least 8 hours.  CBC     Status: Abnormal   Collection Time: 03/26/20  6:35 AM  Result Value Ref Range   WBC 16.7 (H) 4.0 - 10.5 K/uL   RBC  3.76 (L) 3.87 - 5.11 MIL/uL   Hemoglobin 12.2 12.0 - 15.0 g/dL   HCT 03.5 (L) 36 - 46 %   MCV 94.4 80.0 - 100.0 fL   MCH 32.4 26.0 - 34.0 pg   MCHC 34.4 30.0 - 36.0 g/dL   RDW 00.9 (L) 38.1 - 82.9 %   Platelets 207 150 - 400 K/uL   nRBC 0.0 0.0 - 0.2 %    Comment: Performed at [facility], [address], [place], [place] [address]  Comprehensive metabolic panel     Status: Abnormal   Collection Time: 03/26/20  6:35 AM  Result Value Ref Range   Sodium 134 (L) 135 - 145 mmol/L   Potassium 3.6 3.5 - 5.1 mmol/L   Chloride 97 (L) 98 - 111 mmol/L   CO2 26 22 - 32 mmol/L   Glucose, Bld 176 (H) 70 - 99 mg/dL    Comment: Glucose reference range applies only to samples taken after fasting for at least 8 hours.   BUN 14 6 - 20 mg/dL   Creatinine, Ser 9.67 0.44 - 1.00 mg/dL   Calcium 8.5 (L) 8.9 - 10.3 mg/dL   Total Protein 6.4 (L) 6.5 - 8.1 g/dL   Albumin 3.1 (L) 3.5 - 5.0 g/dL   AST 9 (L) 15 - 41 U/L   ALT 9 0 - 44 U/L   Alkaline Phosphatase 111 38 - 126 U/L   Total Bilirubin 0.8 0.3 - 1.2 mg/dL   GFR calc non Af Amer >60 >60 mL/min   GFR calc Af Amer >60 >60 mL/min   Anion gap 11 5 - 15    Comment: Performed at [facility], [address] [place], [place] [address]  Glucose, capillary  Status: Abnormal   Collection Time: 03/26/20  7:56 AM  Result Value Ref Range   Glucose-Capillary 168 (H) 70 - 99 mg/dL    Comment: Glucose reference range applies only to samples taken after fasting for at least 8 hours.    MR FOOT LEFT W WO CONTRAST  Result Date: 03/25/2020 CLINICAL DATA:  Left foot erythema and swelling for several days. Second toe infection. EXAM: MRI OF THE LEFT FOREFOOT WITHOUT AND WITH CONTRAST TECHNIQUE: Multiplanar, multisequence MR imaging of the left forefoot was performed both before and after administration of intravenous contrast. CONTRAST:  7.20mL GADAVIST GADOBUTROL 1 MMOL/ML IV SOLN COMPARISON:  Left foot radiographs 03/25/2020 and 06/23/2013. Left foot MRI  04/02/2015. FINDINGS: Despite efforts by the technologist and patient, mild motion artifact is present on today's exam and could not be eliminated. This reduces exam sensitivity and specificity. Bones/Joint/Cartilage The 2nd and 3rd toes are splayed by a large complex fluid collection in the 2nd web space. There is a small effusion of the 2nd metatarsophalangeal joint with mild synovial enhancement following contrast. No cortical destruction, marrow edema or suspicious marrow enhancement is seen to confirm osteomyelitis. The alignment is normal at the Lisfranc joint. Ligaments Intact Lisfranc ligament. Muscles and Tendons Forefoot muscular atrophy and increased T2 signal attributed to chronic myopathy. No tendon rupture or significant tenosynovitis. Soft tissues Large area of soft tissue ulceration along the plantar aspects of the 2nd and 3rd metatarsals with poor enhancement following contrast consistent with devitalized tissue. There is a large complex fluid collection extending into the 2nd web space, measuring 3.3 x 1.7 x 2.0 cm. There is diffuse enhancement in the surrounding soft tissues following contrast. No other focal fluid collections. IMPRESSION: 1. Large area of soft tissue ulceration along the plantar aspects of the 2nd and 3rd metatarsals with poor enhancement following contrast consistent with devitalized tissue. Large complex fluid collection in the 2nd web space consistent with an abscess. 2. Small effusion of the 2nd metatarsophalangeal joint with mild synovial enhancement following contrast, potentially septic arthritis. No specific evidence of osteomyelitis. 3. The 2nd and 3rd toes are splayed by the complex fluid collection in the 2nd web space. Electronically Signed   By: Carey Bullocks M.D.   On: 03/25/2020 20:57   DG Foot Complete Left  Result Date: 03/25/2020 CLINICAL DATA:  Infection, cellulitis EXAM: LEFT FOOT - COMPLETE 3+ VIEW COMPARISON:  None. FINDINGS: No fracture or  malalignment. No soft tissue emphysema. Moderate calcaneal spur. Mild sclerosis and cortical thickening of the second metatarsal. Possible second digit ulcer at the level of the proximal phalanx. IMPRESSION: 1. Negative for acute fracture or bony destructive change. 2. Suspected sclerosis and cortical bone thickening of the second metatarsal, question chronic osteomyelitis; consider MRI for further evaluation given the presence of soft tissue changes at the second digit. 3. Possible soft tissue ulcer of the second digit at the level of the proximal phalanx. Electronically Signed   By: Jasmine Pang M.D.   On: 03/25/2020 17:05    ROS:  Pertinent items are noted in HPI.  Blood pressure 124/72, pulse (!) 102, temperature 98.1 F (36.7 C), temperature source Oral, resp. rate 16, height 5\' 5"  (1.651 m), weight 74.8 kg, SpO2 94 %. Physical Exam: Pleasant white female no acute distress Head is normocephalic, atraumatic Lungs clear to auscultation with good breath sounds bilaterally Heart examination reveals regular rate and rhythm without S3, S4, murmurs Extremity examination reveals palpable left femoral pulse, dorsalis pedis, and posterior tibial pulses.  Erythema and swelling is noted to be emanating from a fluctuant area between the second and third toes.  Sensation is intact.  A callus with a small granulating wound is present on the plantar surface over the second and third metatarsal heads.  Incision and drainage of the abscess was performed at bedside using local Biofreeze.  Wound culture was sent.  Purulent fluid was obtained.  Assessment/Plan: Impression: Abscess of left foot, diabetes mellitus Plan: Wound care orders have been placed.  Will be able to narrow antibiotic treatment once culture results are back.  Discussed with Dr. Laural BenesJohnson.  Franky MachoMark Mindie Rawdon 03/26/2020, 8:53 AM

## 2020-03-26 NOTE — Progress Notes (Addendum)
Patient's CBG was 467 when checked at 1200.  Provider was notified.  He ordered 15 units of insulin and recheck in 2 hours.  STAT CBS was also ordered.    CBG will be rechecked and patient monitored.  STAT lab CBG was taken.  Lab result was 288.  Provider was notified.  Provider ordered 8 units sliding scale novolog and 4 units meal coverage novolog were given for a total of 12 units.  Patient was monitored.

## 2020-03-27 DIAGNOSIS — F3110 Bipolar disorder, current episode manic without psychotic features, unspecified: Secondary | ICD-10-CM

## 2020-03-27 LAB — CBC WITH DIFFERENTIAL/PLATELET
Abs Immature Granulocytes: 0.05 10*3/uL (ref 0.00–0.07)
Basophils Absolute: 0 10*3/uL (ref 0.0–0.1)
Basophils Relative: 0 %
Eosinophils Absolute: 0.2 10*3/uL (ref 0.0–0.5)
Eosinophils Relative: 3 %
HCT: 33.8 % — ABNORMAL LOW (ref 36.0–46.0)
Hemoglobin: 11.4 g/dL — ABNORMAL LOW (ref 12.0–15.0)
Immature Granulocytes: 1 %
Lymphocytes Relative: 11 %
Lymphs Abs: 0.8 10*3/uL (ref 0.7–4.0)
MCH: 32 pg (ref 26.0–34.0)
MCHC: 33.7 g/dL (ref 30.0–36.0)
MCV: 94.9 fL (ref 80.0–100.0)
Monocytes Absolute: 0.4 10*3/uL (ref 0.1–1.0)
Monocytes Relative: 5 %
Neutro Abs: 5.9 10*3/uL (ref 1.7–7.7)
Neutrophils Relative %: 80 %
Platelets: 203 10*3/uL (ref 150–400)
RBC: 3.56 MIL/uL — ABNORMAL LOW (ref 3.87–5.11)
RDW: 11.3 % — ABNORMAL LOW (ref 11.5–15.5)
WBC: 7.5 10*3/uL (ref 4.0–10.5)
nRBC: 0 % (ref 0.0–0.2)

## 2020-03-27 LAB — GLUCOSE, CAPILLARY
Glucose-Capillary: 180 mg/dL — ABNORMAL HIGH (ref 70–99)
Glucose-Capillary: 157 mg/dL — ABNORMAL HIGH (ref 70–99)
Glucose-Capillary: 133 mg/dL — ABNORMAL HIGH (ref 70–99)
Glucose-Capillary: 184 mg/dL — ABNORMAL HIGH (ref 70–99)

## 2020-03-27 LAB — COMPREHENSIVE METABOLIC PANEL
ALT: 11 U/L (ref 0–44)
AST: 13 U/L — ABNORMAL LOW (ref 15–41)
Albumin: 3.1 g/dL — ABNORMAL LOW (ref 3.5–5.0)
Alkaline Phosphatase: 101 U/L (ref 38–126)
Anion gap: 9 (ref 5–15)
BUN: 13 mg/dL (ref 6–20)
CO2: 25 mmol/L (ref 22–32)
Calcium: 8.3 mg/dL — ABNORMAL LOW (ref 8.9–10.3)
Chloride: 98 mmol/L (ref 98–111)
Creatinine, Ser: 0.84 mg/dL (ref 0.44–1.00)
GFR calc Af Amer: >60 mL/min (ref >60)
GFR calc non Af Amer: >60 mL/min (ref >60)
Glucose, Bld: 192 mg/dL — ABNORMAL HIGH (ref 70–99)
Potassium: 3.5 mmol/L (ref 3.5–5.1)
Sodium: 132 mmol/L — ABNORMAL LOW (ref 135–145)
Total Bilirubin: 0.7 mg/dL (ref 0.3–1.2)
Total Protein: 6.4 g/dL — ABNORMAL LOW (ref 6.5–8.1)

## 2020-03-27 LAB — MAGNESIUM: Magnesium: 1.9 mg/dL (ref 1.7–2.4)

## 2020-03-27 NOTE — Progress Notes (Signed)
Subjective: Denies any significant left foot pain.  Objective: Vital signs in last 24 hours: Temp:  [98.6 F (37 C)-99.8 F (37.7 C)] 98.6 F (37 C) (06/27 0438) Pulse Rate:  [84-91] 84 (06/27 0438) Resp:  [16-17] 16 (06/27 0438) BP: (113-118)/(60-77) 113/77 (06/27 0438) SpO2:  [93 %-95 %] 93 % (06/27 0438) Last BM Date: 03/25/20  Intake/Output from previous day: 06/26 0701 - 06/27 0700 In: 1160 [P.O.:1160] Out: -  Intake/Output this shift: No intake/output data recorded.  General appearance: alert, cooperative and no distress Extremities: Left foot with mild decrease in erythema.  She still has some edema of the distal half.  Dressing in place.  Lab Results:  Recent Labs    03/26/20 0635 03/27/20 0522  WBC 16.7* 7.5  HGB 12.2 11.4*  HCT 35.5* 33.8*  PLT 207 203   BMET Recent Labs    03/26/20 0635 03/26/20 0635 03/26/20 1241 03/27/20 0522  NA 134*  --   --  132*  K 3.6  --   --  3.5  CL 97*  --   --  98  CO2 26  --   --  25  GLUCOSE 176*   < > 288* 192*  BUN 14  --   --  13  CREATININE 0.90  --   --  0.84  CALCIUM 8.5*  --   --  8.3*   < > = values in this interval not displayed.   PT/INR No results for input(s): LABPROT, INR in the last 72 hours.  Studies/Results: MR FOOT LEFT W WO CONTRAST  Result Date: 03/25/2020 CLINICAL DATA:  Left foot erythema and swelling for several days. Second toe infection. EXAM: MRI OF THE LEFT FOREFOOT WITHOUT AND WITH CONTRAST TECHNIQUE: Multiplanar, multisequence MR imaging of the left forefoot was performed both before and after administration of intravenous contrast. CONTRAST:  7.1mL GADAVIST GADOBUTROL 1 MMOL/ML IV SOLN COMPARISON:  Left foot radiographs 03/25/2020 and 06/23/2013. Left foot MRI 04/02/2015. FINDINGS: Despite efforts by the technologist and patient, mild motion artifact is present on today's exam and could not be eliminated. This reduces exam sensitivity and specificity. Bones/Joint/Cartilage The 2nd and  3rd toes are splayed by a large complex fluid collection in the 2nd web space. There is a small effusion of the 2nd metatarsophalangeal joint with mild synovial enhancement following contrast. No cortical destruction, marrow edema or suspicious marrow enhancement is seen to confirm osteomyelitis. The alignment is normal at the Lisfranc joint. Ligaments Intact Lisfranc ligament. Muscles and Tendons Forefoot muscular atrophy and increased T2 signal attributed to chronic myopathy. No tendon rupture or significant tenosynovitis. Soft tissues Large area of soft tissue ulceration along the plantar aspects of the 2nd and 3rd metatarsals with poor enhancement following contrast consistent with devitalized tissue. There is a large complex fluid collection extending into the 2nd web space, measuring 3.3 x 1.7 x 2.0 cm. There is diffuse enhancement in the surrounding soft tissues following contrast. No other focal fluid collections. IMPRESSION: 1. Large area of soft tissue ulceration along the plantar aspects of the 2nd and 3rd metatarsals with poor enhancement following contrast consistent with devitalized tissue. Large complex fluid collection in the 2nd web space consistent with an abscess. 2. Small effusion of the 2nd metatarsophalangeal joint with mild synovial enhancement following contrast, potentially septic arthritis. No specific evidence of osteomyelitis. 3. The 2nd and 3rd toes are splayed by the complex fluid collection in the 2nd web space. Electronically Signed   By: Caryl Comes.D.  On: 03/25/2020 20:57   DG Foot Complete Left  Result Date: 03/25/2020 CLINICAL DATA:  Infection, cellulitis EXAM: LEFT FOOT - COMPLETE 3+ VIEW COMPARISON:  None. FINDINGS: No fracture or malalignment. No soft tissue emphysema. Moderate calcaneal spur. Mild sclerosis and cortical thickening of the second metatarsal. Possible second digit ulcer at the level of the proximal phalanx. IMPRESSION: 1. Negative for acute fracture  or bony destructive change. 2. Suspected sclerosis and cortical bone thickening of the second metatarsal, question chronic osteomyelitis; consider MRI for further evaluation given the presence of soft tissue changes at the second digit. 3. Possible soft tissue ulcer of the second digit at the level of the proximal phalanx. Electronically Signed   By: Jasmine Pang M.D.   On: 03/25/2020 17:05    Anti-infectives: Anti-infectives (From admission, onward)   Start     Dose/Rate Route Frequency Ordered Stop   03/26/20 0500  vancomycin (VANCOREADY) IVPB 750 mg/150 mL     Discontinue     750 mg 150 mL/hr over 60 Minutes Intravenous Every 12 hours 03/25/20 2116     03/25/20 2300  piperacillin-tazobactam (ZOSYN) IVPB 3.375 g     Discontinue     3.375 g 12.5 mL/hr over 240 Minutes Intravenous Every 8 hours 03/25/20 2116     03/25/20 1530  piperacillin-tazobactam (ZOSYN) IVPB 3.375 g        3.375 g 100 mL/hr over 30 Minutes Intravenous  Once 03/25/20 1521 03/25/20 1635   03/25/20 1530  vancomycin (VANCOCIN) IVPB 1000 mg/200 mL premix        1,000 mg 200 mL/hr over 60 Minutes Intravenous  Once 03/25/20 1522 03/25/20 1737      Assessment/Plan: Impression: Left foot abscess, uncontrolled diabetes mellitus.  Initial Gram stain shows gram-positive cocci in pairs.  Final ID pending.  Patient still with hyperglycemia. Plan: Continue current wound management.  LOS: 2 days    Franky Macho 03/27/2020

## 2020-03-27 NOTE — Progress Notes (Signed)
PROGRESS NOTE   [name]  OVZ:[id number] DOB: 10-28-1960 DOA: 03/25/2020 PCP: Rosita Fire, MD   Chief Complaint  Patient presents with  . Leg Swelling    left    Brief Admission History:  59 y.o. female, with history of hypertension, diabetes mellitus type 2 presented to the ED with complaints of redness and swelling of left foot.  Patient denies any injury.  She is a poor historian.  Denies fever or chills.  Denies nausea vomiting or diarrhea.  Denies chest pain or shortness of breath.  Denies abdominal pain or dysuria.  In the ED, x-ray of the foot showed suspected sclerosis and cortical bone thickening of second metatarsal question chronic osteomyelitis.  Recommended MRI of the foot.  Patient started on vancomycin and Zosyn for osteomyelitis.  WBC 23.4, lactic acid initially 2.4, repeat lactic acid 1.5  Assessment & Plan:   Principal Problem:   Cellulitis of left lower extremity Active Problems:   Cellulitis and abscess of foot   DM (diabetes mellitus) (HCC)   HTN (hypertension)   Tobacco abuse   Hyperlipidemia   Bipolar I disorder, most recent episode (or current) manic ([place])   Smoker   Osteomyelitis ([place])   Polyneuropathy in diabetes ([place])  1. Cellulitis LLE/left foot abscess - MRI with no specific findings of osteomyelitis.  I consulted surgery and Dr. Arnoldo Morale was able to do bedside I&D 6/25 with lots of purulent drainage and sent wound culture sample. Continue broad spectrum antibiotics and anaerobic coverage until we get culture data.  Working on improving glycemic control.  Wound culture pending. Prelim: staph and strep species.  2. 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 A1c 8.0 is evidence of poorly controlled disease.  She is managed with oral diabetes medications at home.  In hospital she is being managed with lantus 10 units, novolog 4 units TIDWC plus mod SSI, CBG 5 times per day.  She likely could go home on insulin to aid in  wound healing.  3. Leukocytosis - WBC trending down nicely with treatments.   4. Tobacco - strongly advised against continued smoking as she is HIGH RISK for limb loss given her poorly controlled diabetes. She requested nicotine patch which was ordered.   DVT prophylaxis: lovenox  Code Status:  Full  Family Communication:  Disposition:   Status is: Inpatient  Remains inpatient appropriate because:IV treatments appropriate due to intensity of illness or inability to take PO and Inpatient level of care appropriate due to severity of illness . Awaiting would culture ID, remains on IV antibiotics at this time   Dispo: The patient is from: Home              Anticipated d/c is to: Home              Anticipated d/c date is: 1-2 days              Patient currently is not medically stable to d/c.  Consultants:   Surgery   Procedures:   I&D 6/25 (Dr Arnoldo Morale)  Antimicrobials:  Zosyn 6/25>> Vancomycin 6/25>>  Subjective: Pt reports no specific complaints today.   Objective: Vitals:   03/26/20 1500 03/26/20 1637 03/26/20 2002 03/27/20 0438  BP: 115/67 115/67 118/60 113/77  Pulse: 91 91 90 84  Resp: 17 17 16 16   Temp: 98.8 F (37.1 C) 98.8 F (37.1 C) 99.8 F (37.7 C) 98.6 F (37 C)  TempSrc: Oral Oral Oral Oral  SpO2: 95% 95%  95% 93%  Weight:      Height:        Intake/Output Summary (Last 24 hours) at 03/27/2020 1351 Last data filed at 03/27/2020 0900 Gross per 24 hour  Intake 840 ml  Output --  Net 840 ml   Filed Weights   03/25/20 1414 03/25/20 2148  Weight: 73.9 kg 74.8 kg    Examination:  General exam: Appears calm and comfortable  Respiratory system: Clear to auscultation. Respiratory effort normal. Cardiovascular system: S1 & S2 heard, RRR. No JVD, murmurs, rubs, gallops or clicks. No pedal edema. Gastrointestinal system: Abdomen is nondistended, soft and nontender. No organomegaly or masses felt. Normal bowel sounds heard. Central nervous system: Alert  and oriented. No focal neurological deficits. Extremities: left foot wound draining purulence, surrounding erythema improving, Symmetric 5 x 5 power. Skin: No rashes, lesions or ulcers Psychiatry: Judgement and insight appear normal. Mood & affect appropriate.   Data Reviewed: I have personally reviewed following labs and imaging studies  CBC: Recent Labs  Lab 03/25/20 1531 03/26/20 0635 03/27/20 0522  WBC 23.4* 16.7* 7.5  NEUTROABS 19.9*  --  5.9  HGB 13.3 12.2 11.4*  HCT 39.1 35.5* 33.8*  MCV 94.2 94.4 94.9  PLT 224 207 203    Basic Metabolic Panel: Recent Labs  Lab 03/25/20 1531 03/26/20 0635 03/26/20 1241 03/27/20 0522  NA 129* 134*  --  132*  K 3.7 3.6  --  3.5  CL 92* 97*  --  98  CO2 23 26  --  25  GLUCOSE 194* 176* 288* 192*  BUN 17 14  --  13  CREATININE 0.91 0.90  --  0.84  CALCIUM 8.8* 8.5*  --  8.3*  MG  --   --   --  1.9    GFR: Estimated Creatinine Clearance: 73.9 mL/min (by C-G formula based on SCr of 0.84 mg/dL).  Liver Function Tests: Recent Labs  Lab 03/26/20 0635 03/27/20 0522  AST 9* 13*  ALT 9 11  ALKPHOS 111 101  BILITOT 0.8 0.7  PROT 6.4* 6.4*  ALBUMIN 3.1* 3.1*    CBG: Recent Labs  Lab 03/26/20 1150 03/26/20 1701 03/26/20 2124 03/27/20 0809 03/27/20 1129  GLUCAP 467* 139* 190* 180* 157*    Recent Results (from the past 240 hour(s))  Culture, blood (Routine X 2) w Reflex to ID Panel     Status: None (Preliminary result)   Collection Time: 03/25/20  3:37 PM   Specimen: BLOOD RIGHT FOREARM  Result Value Ref Range Status   Specimen Description BLOOD RIGHT FOREARM  Final   Special Requests   Final    BOTTLES DRAWN AEROBIC AND ANAEROBIC Blood Culture adequate volume   Culture   Final    NO GROWTH < 24 HOURS Performed at [facility], [address], [place], [place] [address]    Report Status PENDING  Incomplete  Culture, blood (Routine X 2) w Reflex to ID Panel     Status: None (Preliminary result)   Collection Time:  03/25/20  3:37 PM   Specimen: Right Antecubital; Blood  Result Value Ref Range Status   Specimen Description RIGHT ANTECUBITAL  Final   Special Requests   Final    BOTTLES DRAWN AEROBIC AND ANAEROBIC Blood Culture adequate volume   Culture   Final    NO GROWTH < 24 HOURS Performed at [facility], [address], [place], [place] [address]    Report Status PENDING  Incomplete  SARS Coronavirus 2 by  RT PCR (hospital order, performed in [facility] hospital lab) Nasopharyngeal Nasopharyngeal Swab     Status: None   Collection Time: 03/25/20  7:10 PM   Specimen: Nasopharyngeal Swab  Result Value Ref Range Status   SARS Coronavirus 2 NEGATIVE NEGATIVE Final    Comment: (NOTE) SARS-CoV-2 target nucleic acids are NOT DETECTED.  The SARS-CoV-2 RNA is generally detectable in upper and lower respiratory specimens during the acute phase of infection. The lowest concentration of SARS-CoV-2 viral copies this assay can detect is 250 copies / mL. A negative result does not preclude SARS-CoV-2 infection and should not be used as the sole basis for treatment or other patient management decisions.  A negative result may occur with improper specimen collection / handling, submission of specimen other than nasopharyngeal swab, presence of viral mutation(s) within the areas targeted by this assay, and inadequate number of viral copies (<250 copies / mL). A negative result must be combined with clinical observations, patient history, and epidemiological information.  Fact Sheet for Patients:   BoilerBrush.com.cy  Fact Sheet for Healthcare Providers: https://pope.com/  This test is not yet approved or  cleared by the [place] FDA and has been authorized for detection and/or diagnosis of SARS-CoV-2 by FDA under an Emergency Use Authorization (EUA).  This EUA will remain in effect (meaning this test can be used) for the duration of the COVID-19  declaration under Section 564(b)(1) of the Act, 21 U.S.C. section 360bbb-3(b)(1), unless the authorization is terminated or revoked sooner.  Performed at [facility], [address] [place], [place] [address]   Aerobic Culture (superficial specimen)     Status: None (Preliminary result)   Collection Time: 03/26/20  8:33 AM   Specimen: Toe; Wound  Result Value Ref Range Status   Specimen Description   Final    TOE Performed at [facility], [address] [place], [place] [address]    Special Requests   Final    Normal Performed at [facility], [address], [place], [place] [address]    Gram Stain   Final    FEW WBC PRESENT, PREDOMINANTLY PMN FEW GRAM POSITIVE COCCI IN PAIRS IN CHAINS    Culture   Final    RARE STAPHYLOCOCCUS SIMULANS FEW GROUP B STREP(S.AGALACTIAE)ISOLATED TESTING AGAINST S. AGALACTIAE NOT ROUTINELY PERFORMED DUE TO PREDICTABILITY OF AMP/PEN/VAN SUSCEPTIBILITY. Performed at [facility], [address] [place], [place] [address]    Report Status PENDING  Incomplete     Radiology Studies: MR FOOT LEFT W WO CONTRAST  Result Date: 03/25/2020 CLINICAL DATA:  Left foot erythema and swelling for several days. Second toe infection. EXAM: MRI OF THE LEFT FOREFOOT WITHOUT AND WITH CONTRAST TECHNIQUE: Multiplanar, multisequence MR imaging of the left forefoot was performed both before and after administration of intravenous contrast. CONTRAST:  7.61mL GADAVIST GADOBUTROL 1 MMOL/ML IV SOLN COMPARISON:  Left foot radiographs 03/25/2020 and 06/23/2013. Left foot MRI 04/02/2015. FINDINGS: Despite efforts by the technologist and patient, mild motion artifact is present on today's exam and could not be eliminated. This reduces exam sensitivity and specificity. Bones/Joint/Cartilage The 2nd and 3rd toes are splayed by a large complex fluid collection in the 2nd web space. There is a small effusion of the 2nd metatarsophalangeal joint with mild synovial enhancement  following contrast. No cortical destruction, marrow edema or suspicious marrow enhancement is seen to confirm osteomyelitis. The alignment is normal at the Lisfranc joint. Ligaments Intact Lisfranc ligament. Muscles and Tendons Forefoot muscular atrophy and increased  T2 signal attributed to chronic myopathy. No tendon rupture or significant tenosynovitis. Soft tissues Large area of soft tissue ulceration along the plantar aspects of the 2nd and 3rd metatarsals with poor enhancement following contrast consistent with devitalized tissue. There is a large complex fluid collection extending into the 2nd web space, measuring 3.3 x 1.7 x 2.0 cm. There is diffuse enhancement in the surrounding soft tissues following contrast. No other focal fluid collections. IMPRESSION: 1. Large area of soft tissue ulceration along the plantar aspects of the 2nd and 3rd metatarsals with poor enhancement following contrast consistent with devitalized tissue. Large complex fluid collection in the 2nd web space consistent with an abscess. 2. Small effusion of the 2nd metatarsophalangeal joint with mild synovial enhancement following contrast, potentially septic arthritis. No specific evidence of osteomyelitis. 3. The 2nd and 3rd toes are splayed by the complex fluid collection in the 2nd web space. Electronically Signed   By: Carey Bullocks M.D.   On: 03/25/2020 20:57   DG Foot Complete Left  Result Date: 03/25/2020 CLINICAL DATA:  Infection, cellulitis EXAM: LEFT FOOT - COMPLETE 3+ VIEW COMPARISON:  None. FINDINGS: No fracture or malalignment. No soft tissue emphysema. Moderate calcaneal spur. Mild sclerosis and cortical thickening of the second metatarsal. Possible second digit ulcer at the level of the proximal phalanx. IMPRESSION: 1. Negative for acute fracture or bony destructive change. 2. Suspected sclerosis and cortical bone thickening of the second metatarsal, question chronic osteomyelitis; consider MRI for further evaluation  given the presence of soft tissue changes at the second digit. 3. Possible soft tissue ulcer of the second digit at the level of the proximal phalanx. Electronically Signed   By: Jasmine Pang M.D.   On: 03/25/2020 17:05   Scheduled Meds: . enoxaparin (LOVENOX) injection  40 mg Subcutaneous Q24H  . insulin aspart  0-15 Units Subcutaneous TID WC  . insulin aspart  0-5 Units Subcutaneous QHS  . insulin aspart  10 Units Subcutaneous Once  . insulin aspart  4 Units Subcutaneous TID WC  . insulin glargine  10 Units Subcutaneous Daily  . lisinopril  10 mg Oral Daily  . nicotine  21 mg Transdermal Daily   Continuous Infusions: . sodium chloride 75 mL/hr at 03/27/20 0908  . piperacillin-tazobactam (ZOSYN)  IV 3.375 g (03/27/20 0911)  . vancomycin 750 mg (03/27/20 0528)     LOS: 2 days   Time spent:17 mins    Standley Dakins, MD How to contact the [facility] Attending or Consulting provider 7A - 7P or covering provider during after hours 7P -7A, for this patient?  1. Check the care team in [facility] and look for a) attending/consulting [facility] provider listed and b) the [facility] team listed 2. Log into [email or web address] and use [facility] universal password to access. If you do not have the password, please contact the hospital operator. 3. Locate the [facility] provider you are looking for under [facility] and page to a number that you can be directly reached. 4. If you still have difficulty reaching the provider, please page the [facility] (Director on Call) for the Hospitalists listed on amion for assistance.  03/27/2020, 1:51 PM

## 2020-03-28 DIAGNOSIS — Z720 Tobacco use: Secondary | ICD-10-CM

## 2020-03-28 LAB — COMPREHENSIVE METABOLIC PANEL
ALT: 15 U/L (ref 0–44)
AST: 12 U/L — ABNORMAL LOW (ref 15–41)
Albumin: 2.9 g/dL — ABNORMAL LOW (ref 3.5–5.0)
Alkaline Phosphatase: 92 U/L (ref 38–126)
Anion gap: 9 (ref 5–15)
BUN: 13 mg/dL (ref 6–20)
CO2: 26 mmol/L (ref 22–32)
Calcium: 8.4 mg/dL — ABNORMAL LOW (ref 8.9–10.3)
Chloride: 103 mmol/L (ref 98–111)
Creatinine, Ser: 0.91 mg/dL (ref 0.44–1.00)
GFR calc Af Amer: >60 mL/min (ref >60)
GFR calc non Af Amer: >60 mL/min (ref >60)
Glucose, Bld: 172 mg/dL — ABNORMAL HIGH (ref 70–99)
Potassium: 4.3 mmol/L (ref 3.5–5.1)
Sodium: 138 mmol/L (ref 135–145)
Total Bilirubin: 0.4 mg/dL (ref 0.3–1.2)
Total Protein: 6.1 g/dL — ABNORMAL LOW (ref 6.5–8.1)

## 2020-03-28 LAB — AEROBIC CULTURE W GRAM STAIN (SUPERFICIAL SPECIMEN): Special Requests: NORMAL

## 2020-03-28 LAB — GLUCOSE, CAPILLARY
Glucose-Capillary: 190 mg/dL — ABNORMAL HIGH (ref 70–99)
Glucose-Capillary: 154 mg/dL — ABNORMAL HIGH (ref 70–99)
Glucose-Capillary: 231 mg/dL — ABNORMAL HIGH (ref 70–99)

## 2020-03-28 LAB — MAGNESIUM: Magnesium: 2 mg/dL (ref 1.7–2.4)

## 2020-03-28 MED ORDER — INSULIN PEN NEEDLE 31G X 5 MM MISC
1.0000 | 1 refills | Status: DC
Start: 2020-03-28 — End: 2023-03-05

## 2020-03-28 MED ORDER — SULFAMETHOXAZOLE-TRIMETHOPRIM 800-160 MG PO TABS: 1.0000 | ORAL_TABLET | Freq: Two times a day (BID) | ORAL | 0 refills | Status: AC

## 2020-03-28 MED ORDER — METFORMIN HCL ER 500 MG PO TB24: 500.0000 mg | ORAL_TABLET | Freq: Two times a day (BID) | ORAL | 1 refills | Status: DC

## 2020-03-28 MED ORDER — BLOOD GLUCOSE METER KIT: PACK | 0 refills | Status: AC

## 2020-03-28 MED ORDER — INSULIN GLARGINE 100 UNIT/ML SOLOSTAR PEN
10.0000 [IU] | PEN_INJECTOR | Freq: Every day | SUBCUTANEOUS | 1 refills | Status: DC
Start: 2020-03-28 — End: 2023-03-05

## 2020-03-28 MED ORDER — SULFAMETHOXAZOLE-TRIMETHOPRIM 800-160 MG PO TABS: 1.0000 | ORAL_TABLET | Freq: Two times a day (BID) | ORAL | Status: DC

## 2020-03-28 MED ORDER — INSULIN LISPRO (1 UNIT DIAL) 100 UNIT/ML (KWIKPEN)
3.0000 [IU] | PEN_INJECTOR | Freq: Three times a day (TID) | SUBCUTANEOUS | 1 refills | Status: DC
Start: 2020-03-28 — End: 2021-05-30

## 2020-03-28 NOTE — Discharge Instructions (Signed)
Clean wound daily with soap and water. Follow up with Dr. Lovell Sheehan on 7/8. Please follow up with podiatry on 7/2 as scheduled.   Check blood sugars 4 times per day.  Take insulin as prescribed.   IMPORTANT INFORMATION: PAY CLOSE ATTENTION   PHYSICIAN DISCHARGE INSTRUCTIONS  Follow with Primary care provider  Avon Gully, MD  and other consultants as instructed by your Hospitalist Physician  SEEK MEDICAL CARE OR RETURN TO EMERGENCY ROOM IF SYMPTOMS COME BACK, WORSEN OR NEW PROBLEM DEVELOPS   Please note: You were cared for by a hospitalist during your hospital stay. Every effort will be made to forward records to your primary care provider.  You can request that your primary care provider send for your hospital records if they have not received them.  Once you are discharged, your primary care physician will handle any further medical issues. Please note that NO REFILLS for any discharge medications will be authorized once you are discharged, as it is imperative that you return to your primary care physician (or establish a relationship with a primary care physician if you do not have one) for your post hospital discharge needs so that they can reassess your need for medications and monitor your lab values.  Please get a complete blood count and chemistry panel checked by your Primary MD at your next visit, and again as instructed by your Primary MD.  Get Medicines reviewed and adjusted: Please take all your medications with you for your next visit with your Primary MD  Laboratory/radiological data: Please request your Primary MD to go over all hospital tests and procedure/radiological results at the follow up, please ask your primary care provider to get all Hospital records sent to his/her office.  In some cases, they will be blood work, cultures and biopsy results pending at the time of your discharge. Please request that your primary care provider follow up on these results.  If you  are diabetic, please bring your blood sugar readings with you to your follow up appointment with primary care.    Please call and make your follow up appointments as soon as possible.    Also Note the following: If you experience worsening of your admission symptoms, develop shortness of breath, life threatening emergency, suicidal or homicidal thoughts you must seek medical attention immediately by calling 911 or calling your MD immediately  if symptoms less severe.  You must read complete instructions/literature along with all the possible adverse reactions/side effects for all the Medicines you take and that have been prescribed to you. Take any new Medicines after you have completely understood and accpet all the possible adverse reactions/side effects.   Do not drive when taking Pain medications or sleeping medications (Benzodiazepines)  Do not take more than prescribed Pain, Sleep and Anxiety Medications. It is not advisable to combine anxiety,sleep and pain medications without talking with your primary care practitioner  Special Instructions: If you have smoked or chewed Tobacco  in the last 2 yrs please stop smoking, stop any regular Alcohol  and or any Recreational drug use.  Wear Seat belts while driving.  Do not drive if taking any narcotic, mind altering or controlled substances or recreational drugs or alcohol.

## 2020-03-28 NOTE — Discharge Summary (Signed)
Physician Discharge Summary  [name] IAX:[id number] DOB: 1961/06/11 DOA: 03/25/2020  PCP: Rosita Fire, MD Surgeon: Dr. Arnoldo Morale  Admit date: 03/25/2020 Discharge date: 03/28/2020  Admitted From: Home  Disposition: Home   Recommendations for Outpatient Follow-up:  1. Follow up with PCP in 1 weeks 2. Follow up with Dr. Arnoldo Morale as scheduled 3. Follow up with podiatry on 7/2 as scheduled  4. Please titrate insulin doses as needed for better glycemic control  5. Please follow up on the following pending results: Final wound culture results  Discharge Condition: STABLE   CODE STATUS: FULL    Brief Hospitalization Summary: Please see all hospital notes, images, labs for full details of the hospitalization. ADMISSION HPI:  [name]  is a 59 y.o. female, with history of hypertension, diabetes mellitus type 2 presented to the ED with complaints of redness and swelling of left foot.  Patient denies any injury.  She is a poor historian.  Denies fever or chills.  Denies nausea vomiting or diarrhea.  Denies chest pain or shortness of breath.  Denies abdominal pain or dysuria.  In the ED, x-ray of the foot showed suspected sclerosis and cortical bone thickening of second metatarsal question chronic osteomyelitis.  Recommended MRI of the foot. Patient started on vancomycin and Zosyn for osteomyelitis. WBC 23.4, lactic acid initially 2.4, repeat lactic acid 1.5.    Assessment & Plan:   Principal Problem:   Cellulitis of left lower extremity Active Problems:   Cellulitis and abscess of foot   DM (diabetes mellitus) (HCC)   HTN (hypertension)   Tobacco abuse   Hyperlipidemia   Bipolar I disorder, most recent episode (or current) manic ([place])   Smoker   Osteomyelitis ([place])   Polyneuropathy in diabetes ([place])  1. Cellulitis LLE/left foot abscess - MRI with no specific findings of osteomyelitis.  I consulted surgery and Dr. Arnoldo Morale was able to do bedside I&D 6/25 with lots of  purulent drainage and sent wound culture sample. Initially treated with broad spectrum antibiotics until we got culture data.  She has staph and strep species growing and placed on Bactrim DS. Wound is looking much better now and surgery recommending cleaning wound daily with soap and water and close follow up.  She has podiatry appt scheduled for 7/2.  She has follow up with Dr. Arnoldo Morale for 7/8.   Working on improving glycemic control.  Final Wound culture pending. Prelim: staph and strep species.  2. 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 A1c 8.0 is evidence of poorly controlled disease.  She is managed with oral diabetes medications at home.  In hospital she was managed with lantus 10 units, novolog 4 units TIDWC plus mod SSI, CBG 5 times per day.  She likely could go home on insulin to aid in wound healing.  DC on lantus 10 units daily and novolog 3 units TID with meals. Metformin ER 500 mg BID.  3. Leukocytosis - WBC trending down nicely with treatments.   4. Tobacco - strongly advised against continued smoking as she is HIGH RISK for limb loss given her poorly controlled diabetes. She requested nicotine patch which was ordered.   DVT prophylaxis: lovenox  Code Status:  Full  Family Communication:  Disposition:  HOME    Discharge Diagnoses:  Principal Problem:   Cellulitis of left lower extremity Active Problems:   Cellulitis and abscess of foot   DM (diabetes mellitus) ([place])   HTN (hypertension)   Tobacco abuse  Hyperlipidemia   Bipolar I disorder, most recent episode (or current) manic ([place])   Smoker   Osteomyelitis ([place])   Polyneuropathy in diabetes [facility])   Discharge Instructions: Discharge Instructions    Ambulatory referral to Podiatry   Complete by: As directed      Allergies as of 03/28/2020   No Known Allergies     Medication List    STOP taking these medications   glipiZIDE 5 MG tablet Commonly known as: GLUCOTROL    metFORMIN 850 MG tablet Commonly known as: GLUCOPHAGE Replaced by: metFORMIN 500 MG 24 hr tablet     TAKE these medications   blood glucose meter kit and supplies Dispense based on patient and insurance preference. Use up to four times daily as directed. (FOR ICD-10 E10.9, E11.9).   insulin glargine 100 UNIT/ML Solostar Pen Commonly known as: LANTUS Inject 10 Units into the skin at bedtime.   insulin lispro 100 UNIT/ML KwikPen Commonly known as: HUMALOG Inject 0.03 mLs (3 Units total) into the skin with breakfast, with lunch, and with evening meal.   Insulin Pen Needle 31G X 5 MM Misc 1 Device by Does not apply route as directed.   lisinopril-hydrochlorothiazide 10-12.5 MG tablet Commonly known as: ZESTORETIC Take 1 tablet by mouth daily.   metFORMIN 500 MG 24 hr tablet Commonly known as: GLUCOPHAGE-XR Take 1 tablet (500 mg total) by mouth 2 (two) times daily with a meal. Replaces: metFORMIN 850 MG tablet   sulfamethoxazole-trimethoprim 800-160 MG tablet Commonly known as: BACTRIM DS Take 1 tablet by mouth every 12 (twelve) hours for 10 days.       Follow-up Information    Rosita Fire, MD. Schedule an appointment as soon as possible for a visit in 1 week(s).   Specialty: Internal Medicine Contact information: [place] [address] [phone]        Aviva Signs, MD. Schedule an appointment as soon as possible for a visit on 04/07/2020.   Specialty: General Surgery Contact information: [address] [address] [phone]              No Known Allergies Allergies as of 03/28/2020   No Known Allergies     Medication List    STOP taking these medications   glipiZIDE 5 MG tablet Commonly known as: GLUCOTROL   metFORMIN 850 MG tablet Commonly known as: GLUCOPHAGE Replaced by: metFORMIN 500 MG 24 hr tablet     TAKE these medications   blood glucose meter kit and supplies Dispense based on patient  and insurance preference. Use up to four times daily as directed. (FOR ICD-10 E10.9, E11.9).   insulin glargine 100 UNIT/ML Solostar Pen Commonly known as: LANTUS Inject 10 Units into the skin at bedtime.   insulin lispro 100 UNIT/ML KwikPen Commonly known as: HUMALOG Inject 0.03 mLs (3 Units total) into the skin with breakfast, with lunch, and with evening meal.   Insulin Pen Needle 31G X 5 MM Misc 1 Device by Does not apply route as directed.   lisinopril-hydrochlorothiazide 10-12.5 MG tablet Commonly known as: ZESTORETIC Take 1 tablet by mouth daily.   metFORMIN 500 MG 24 hr tablet Commonly known as: GLUCOPHAGE-XR Take 1 tablet (500 mg total) by mouth 2 (two) times daily with a meal. Replaces: metFORMIN 850 MG tablet   sulfamethoxazole-trimethoprim 800-160 MG tablet Commonly known as: BACTRIM DS Take 1 tablet by mouth every 12 (twelve) hours for 10 days.       Procedures/Studies: MR FOOT LEFT  W WO CONTRAST  Result Date: 03/25/2020 CLINICAL DATA:  Left foot erythema and swelling for several days. Second toe infection. EXAM: MRI OF THE LEFT FOREFOOT WITHOUT AND WITH CONTRAST TECHNIQUE: Multiplanar, multisequence MR imaging of the left forefoot was performed both before and after administration of intravenous contrast. CONTRAST:  7.57m GADAVIST GADOBUTROL 1 MMOL/ML IV SOLN COMPARISON:  Left foot radiographs 03/25/2020 and 06/23/2013. Left foot MRI 04/02/2015. FINDINGS: Despite efforts by the technologist and patient, mild motion artifact is present on today's exam and could not be eliminated. This reduces exam sensitivity and specificity. Bones/Joint/Cartilage The 2nd and 3rd toes are splayed by a large complex fluid collection in the 2nd web space. There is a small effusion of the 2nd metatarsophalangeal joint with mild synovial enhancement following contrast. No cortical destruction, marrow edema or suspicious marrow enhancement is seen to confirm osteomyelitis. The alignment is  normal at the Lisfranc joint. Ligaments Intact Lisfranc ligament. Muscles and Tendons Forefoot muscular atrophy and increased T2 signal attributed to chronic myopathy. No tendon rupture or significant tenosynovitis. Soft tissues Large area of soft tissue ulceration along the plantar aspects of the 2nd and 3rd metatarsals with poor enhancement following contrast consistent with devitalized tissue. There is a large complex fluid collection extending into the 2nd web space, measuring 3.3 x 1.7 x 2.0 cm. There is diffuse enhancement in the surrounding soft tissues following contrast. No other focal fluid collections. IMPRESSION: 1. Large area of soft tissue ulceration along the plantar aspects of the 2nd and 3rd metatarsals with poor enhancement following contrast consistent with devitalized tissue. Large complex fluid collection in the 2nd web space consistent with an abscess. 2. Small effusion of the 2nd metatarsophalangeal joint with mild synovial enhancement following contrast, potentially septic arthritis. No specific evidence of osteomyelitis. 3. The 2nd and 3rd toes are splayed by the complex fluid collection in the 2nd web space. Electronically Signed   By: WRichardean SaleM.D.   On: 03/25/2020 20:57   DG Foot Complete Left  Result Date: 03/25/2020 CLINICAL DATA:  Infection, cellulitis EXAM: LEFT FOOT - COMPLETE 3+ VIEW COMPARISON:  None. FINDINGS: No fracture or malalignment. No soft tissue emphysema. Moderate calcaneal spur. Mild sclerosis and cortical thickening of the second metatarsal. Possible second digit ulcer at the level of the proximal phalanx. IMPRESSION: 1. Negative for acute fracture or bony destructive change. 2. Suspected sclerosis and cortical bone thickening of the second metatarsal, question chronic osteomyelitis; consider MRI for further evaluation given the presence of soft tissue changes at the second digit. 3. Possible soft tissue ulcer of the second digit at the level of the proximal  phalanx. Electronically Signed   By: KDonavan FoilM.D.   On: 03/25/2020 17:05      Subjective: Pt reports that she has used insulin in the past and feels comfortable with giving insulin at home.  She says she will follow up and do home wound care as instructed.    Discharge Exam: Vitals:   03/28/20 0537 03/28/20 0748  BP: 116/77   Pulse: 81   Resp: 16   Temp: 98.8 F (37.1 C)   SpO2: 99% 96%   Vitals:   03/27/20 2019 03/27/20 2050 03/28/20 0537 03/28/20 0748  BP:  114/68 116/77   Pulse:  78 81   Resp:  16 16   Temp:  99.1 F (37.3 C) 98.8 F (37.1 C)   TempSrc:  Oral Oral   SpO2: 98% 100% 99% 96%  Weight:  Height:       General: Pt is alert, awake, not in acute distress Cardiovascular: RRR, S1/S2 +, no rubs, no gallops Respiratory: CTA bilaterally, no wheezing, no rhonchi Abdominal: Soft, NT, ND, bowel sounds + Extremities: foot wound looking clean and healing well.    The results of significant diagnostics from this hospitalization (including imaging, microbiology, ancillary and laboratory) are listed below for reference.     Microbiology: Recent Results (from the past 240 hour(s))  Culture, blood (Routine X 2) w Reflex to ID Panel     Status: None (Preliminary result)   Collection Time: 03/25/20  3:37 PM   Specimen: BLOOD RIGHT FOREARM  Result Value Ref Range Status   Specimen Description BLOOD RIGHT FOREARM  Final   Special Requests   Final    BOTTLES DRAWN AEROBIC AND ANAEROBIC Blood Culture adequate volume   Culture   Final    NO GROWTH < 24 HOURS Performed at [facility], [address] [place], [place] [address]    Report Status PENDING  Incomplete  Culture, blood (Routine X 2) w Reflex to ID Panel     Status: None (Preliminary result)   Collection Time: 03/25/20  3:37 PM   Specimen: Right Antecubital; Blood  Result Value Ref Range Status   Specimen Description RIGHT ANTECUBITAL  Final   Special Requests   Final    BOTTLES DRAWN AEROBIC AND  ANAEROBIC Blood Culture adequate volume   Culture   Final    NO GROWTH < 24 HOURS Performed at [facility], [address] [place], [place] [address]    Report Status PENDING  Incomplete  SARS Coronavirus 2 by RT PCR (hospital order, performed in [place] hospital lab) Nasopharyngeal Nasopharyngeal Swab     Status: None   Collection Time: 03/25/20  7:10 PM   Specimen: Nasopharyngeal Swab  Result Value Ref Range Status   SARS Coronavirus 2 NEGATIVE NEGATIVE Final    Comment: (NOTE) SARS-CoV-2 target nucleic acids are NOT DETECTED.  The SARS-CoV-2 RNA is generally detectable in upper and lower respiratory specimens during the acute phase of infection. The lowest concentration of SARS-CoV-2 viral copies this assay can detect is 250 copies / mL. A negative result does not preclude SARS-CoV-2 infection and should not be used as the sole basis for treatment or other patient management decisions.  A negative result may occur with improper specimen collection / handling, submission of specimen other than nasopharyngeal swab, presence of viral mutation(s) within the areas targeted by this assay, and inadequate number of viral copies (<250 copies / mL). A negative result must be combined with clinical observations, patient history, and epidemiological information.  Fact Sheet for Patients:   StrictlyIdeas.no  Fact Sheet for Healthcare Providers: BankingDealers.co.za  This test is not yet approved or  cleared by the [place] FDA and has been authorized for detection and/or diagnosis of SARS-CoV-2 by FDA under an Emergency Use Authorization (EUA).  This EUA will remain in effect (meaning this test can be used) for the duration of the COVID-19 declaration under Section 564(b)(1) of the Act, 21 U.S.C. section 360bbb-3(b)(1), unless the authorization is terminated or revoked sooner.  Performed at [facility], [address],  [place], [place] [address]   Aerobic Culture (superficial specimen)     Status: None (Preliminary result)   Collection Time: 03/26/20  8:33 AM   Specimen: Toe; Wound  Result Value Ref Range Status   Specimen Description   Final    TOE Performed  at [facility], [address], [place], [place] [address]    Special Requests   Final    Normal Performed at [facility] [place], [place] [address]    Gram Stain   Final    FEW WBC PRESENT, PREDOMINANTLY PMN FEW GRAM POSITIVE COCCI IN PAIRS IN CHAINS    Culture   Final    RARE STAPHYLOCOCCUS SIMULANS FEW GROUP B STREP(S.AGALACTIAE)ISOLATED TESTING AGAINST S. AGALACTIAE NOT ROUTINELY PERFORMED DUE TO PREDICTABILITY OF AMP/PEN/VAN SUSCEPTIBILITY. Performed at [facility], [place] [address] [place], [place] [address]    Report Status PENDING  Incomplete     Labs: BNP (last 3 results) No results for input(s): BNP in the last 8760 hours. Basic Metabolic Panel: Recent Labs  Lab 03/25/20 1531 03/26/20 0635 03/26/20 1241 03/27/20 0522 03/28/20 0432  NA 129* 134*  --  132* 138  K 3.7 3.6  --  3.5 4.3  CL 92* 97*  --  98 103  CO2 23 26  --  25 26  GLUCOSE 194* 176* 288* 192* 172*  BUN 17 14  --  13 13  CREATININE 0.91 0.90  --  0.84 0.91  CALCIUM 8.8* 8.5*  --  8.3* 8.4*  MG  --   --   --  1.9 2.0   Liver Function Tests: Recent Labs  Lab 03/26/20 0635 03/27/20 0522 03/28/20 0432  AST 9* 13* 12*  ALT _0 ALKPHOS 111 101 92  BILITOT 0.8 0.7 0.4  PROT 6.4* 6.4* 6.1*  ALBUMIN 3.1* 3.1* 2.9*   No results for input(s): LIPASE, AMYLASE in the last 168 hours. No results for input(s): AMMONIA in the last 168 hours. CBC: Recent Labs  Lab 03/25/20 1531 03/26/20 0635 03/27/20 0522  WBC 23.4* 16.7* 7.5  NEUTROABS 19.9*  --  5.9  HGB 13.3 12.2 11.4*  HCT 39.1 35.5* 33.8*  MCV 94.2 94.4 94.9  PLT 224 207 203   Cardiac Enzymes: No results for input(s): CKTOTAL, CKMB, CKMBINDEX, TROPONINI in the last 168  hours. BNP: Invalid input(s): POCBNP CBG: Recent Labs  Lab 03/27/20 1641 03/27/20 2052 03/28/20 0233 03/28/20 0739 03/28/20 1131  GLUCAP 133* 184* 190* 154* 231*   D-Dimer No results for input(s): DDIMER in the last 72 hours. Hgb A1c Recent Labs    03/26/20 0635  HGBA1C 8.0*   Lipid Profile No results for input(s): CHOL, HDL, LDLCALC, TRIG, CHOLHDL, LDLDIRECT in the last 72 hours. Thyroid function studies No results for input(s): TSH, T4TOTAL, T3FREE, THYROIDAB in the last 72 hours.  Invalid input(s): FREET3 Anemia work up No results for input(s): VITAMINB12, FOLATE, FERRITIN, TIBC, IRON, RETICCTPCT in the last 72 hours. Urinalysis    Component Value Date/Time   COLORURINE YELLOW 11/14/2017 0355   APPEARANCEUR CLEAR 11/14/2017 0355   LABSPEC 1.008 11/14/2017 0355   PHURINE 6.0 11/14/2017 0355   GLUCOSEU 50 (A) 11/14/2017 0355   HGBUR NEGATIVE 11/14/2017 0355   BILIRUBINUR NEGATIVE 11/14/2017 0355   KETONESUR NEGATIVE 11/14/2017 0355   PROTEINUR NEGATIVE 11/14/2017 0355   UROBILINOGEN 0.2 06/23/2013 1750   NITRITE NEGATIVE 11/14/2017 0355   LEUKOCYTESUR MODERATE (A) 11/14/2017 0355   Sepsis Labs Invalid input(s): PROCALCITONIN,  WBC,  LACTICIDVEN Microbiology Recent Results (from the past 240 hour(s))  Culture, blood (Routine X 2) w Reflex to ID Panel     Status: None (Preliminary result)   Collection Time: 03/25/20  3:37 PM   Specimen: BLOOD RIGHT FOREARM  Result Value  Ref Range Status   Specimen Description BLOOD RIGHT FOREARM  Final   Special Requests   Final    BOTTLES DRAWN AEROBIC AND ANAEROBIC Blood Culture adequate volume   Culture   Final    NO GROWTH < 24 HOURS Performed at [facility], [address] [place], [place] [address]    Report Status PENDING  Incomplete  Culture, blood (Routine X 2) w Reflex to ID Panel     Status: None (Preliminary result)   Collection Time: 03/25/20  3:37 PM   Specimen: Right Antecubital; Blood  Result Value Ref  Range Status   Specimen Description RIGHT ANTECUBITAL  Final   Special Requests   Final    BOTTLES DRAWN AEROBIC AND ANAEROBIC Blood Culture adequate volume   Culture   Final    NO GROWTH < 24 HOURS Performed at [facility], [address], [place], [place] [address]    Report Status PENDING  Incomplete  SARS Coronavirus 2 by RT PCR (hospital order, performed in [place] hospital lab) Nasopharyngeal Nasopharyngeal Swab     Status: None   Collection Time: 03/25/20  7:10 PM   Specimen: Nasopharyngeal Swab  Result Value Ref Range Status   SARS Coronavirus 2 NEGATIVE NEGATIVE Final    Comment: (NOTE) SARS-CoV-2 target nucleic acids are NOT DETECTED.  The SARS-CoV-2 RNA is generally detectable in upper and lower respiratory specimens during the acute phase of infection. The lowest concentration of SARS-CoV-2 viral copies this assay can detect is 250 copies / mL. A negative result does not preclude SARS-CoV-2 infection and should not be used as the sole basis for treatment or other patient management decisions.  A negative result may occur with improper specimen collection / handling, submission of specimen other than nasopharyngeal swab, presence of viral mutation(s) within the areas targeted by this assay, and inadequate number of viral copies (<250 copies / mL). A negative result must be combined with clinical observations, patient history, and epidemiological information.  Fact Sheet for Patients:   StrictlyIdeas.no  Fact Sheet for Healthcare Providers: BankingDealers.co.za  This test is not yet approved or  cleared by the [place] FDA and has been authorized for detection and/or diagnosis of SARS-CoV-2 by FDA under an Emergency Use Authorization (EUA).  This EUA will remain in effect (meaning this test can be used) for the duration of the COVID-19 declaration under Section 564(b)(1) of the Act, 21 U.S.C. section  360bbb-3(b)(1), unless the authorization is terminated or revoked sooner.  Performed at [facility], [address] [place], [place] [address]   Aerobic Culture (superficial specimen)     Status: None (Preliminary result)   Collection Time: 03/26/20  8:33 AM   Specimen: Toe; Wound  Result Value Ref Range Status   Specimen Description   Final    TOE Performed at [facility], [address] [place], [place] [address]    Special Requests   Final    Normal Performed at [facility] [place], [place] [address]    Gram Stain   Final    FEW WBC PRESENT, PREDOMINANTLY PMN FEW GRAM POSITIVE COCCI IN PAIRS IN CHAINS    Culture   Final    RARE STAPHYLOCOCCUS SIMULANS FEW GROUP B STREP(S.AGALACTIAE)ISOLATED TESTING AGAINST S. AGALACTIAE NOT ROUTINELY PERFORMED DUE TO PREDICTABILITY OF AMP/PEN/VAN SUSCEPTIBILITY. Performed at [facility], [place] [address], [place], [place] [address]    Report Status PENDING  Incomplete    Time coordinating discharge: 35 minutes  SIGNED:  Irwin Brakeman, MD  [facility] 03/28/2020, 1:03 PM How to contact the [facility] Attending or Consulting provider [place] or covering provider during after hours [place], for this patient?  1. Check the care team in [facility] and look for a) attending/consulting [facility] provider listed and b) the [facility] team listed 2. Log into [email or web address] and use [place]'s universal password to access. If you do not have the password, please contact the hospital operator. 3. Locate the [facility] provider you are looking for under [facility] and page to a number that you can be directly reached. 4. If you still have difficulty reaching the provider, please page the [facility] (Director on Call) for the Hospitalists listed on amion for assistance.

## 2020-03-28 NOTE — TOC Transition Note (Signed)
Transition of Care [facility]) - CM/SW Discharge Note   Patient Details  Name: [name] MRN: [id number] Date of Birth: 1961-08-08  Transition of Care [facility]) CM/SW Contact:  Shade Flood, LCSW Phone Number: 03/28/2020, 12:56 PM   Clinical Narrative:     Pt stable for dc. Initially Dr. Arnoldo Morale wanting daily dressing changes with HH. Unable to find a [facility] agency that will accept pt's insurance. Updated Dr. Arnoldo Morale who stated that pt can just clean daily with soap and water. TOC spoke with pt who states that she lives with family and has a PCS Aide M-F. Discussed [place] referral and inability to find [facility] agency. Discussed [facility] with pt for Paramedic follow up on wound care and insulin teaching. Pt declines referral at this time stating that she doesn't want the Paramedics coming to her house.   Dr. Wynetta Emery to order Glucose Meter and supplies along with insulin to pt's pharmacy at dc.  No other TOC needs identified at this time.  Expected Discharge Plan: Home/Self Care Barriers to Discharge: No [facility] will accept this patient   Patient Goals and CMS Choice        Expected Discharge Plan and Services Expected Discharge Plan: Home/Self Care In-house Referral: Clinical Social Work   Post Acute Care Choice: Resumption of Svcs/PTA Provider Living arrangements for the past 2 months: Single Family Home Expected Discharge Date: 03/28/20                                    Prior Living Arrangements/Services Living arrangements for the past 2 months: Single Family Home Lives with:: Relatives Patient language and need for interpreter reviewed:: Yes Do you feel safe going back to the place where you live?: Yes      Need for Family Participation in Patient Care: Yes (Comment) Care giver support system in place?: Yes (comment)   Criminal Activity/Legal Involvement Pertinent to Current Situation/Hospitalization: No - Comment as needed  Activities of  Daily Living Home Assistive Devices/Equipment: Wheelchair ADL Screening (condition at time of admission) Patient's cognitive ability adequate to safely complete daily activities?: Yes Is the patient deaf or have difficulty hearing?: No Does the patient have difficulty seeing, even when wearing glasses/contacts?: No Does the patient have difficulty concentrating, remembering, or making decisions?: No Patient able to express need for assistance with ADLs?: Yes Does the patient have difficulty dressing or bathing?: No Independently performs ADLs?: Yes (appropriate for developmental age) Does the patient have difficulty walking or climbing stairs?: No Weakness of Legs: None Weakness of Arms/Hands: None  Permission Sought/Granted                  Emotional Assessment       Orientation: : Oriented to Self, Oriented to Place, Oriented to  Time, Oriented to Situation Alcohol / Substance Use: Not Applicable Psych Involvement: No (comment)  Admission diagnosis:  Osteomyelitis ([place]) [M86.9] Cellulitis of left lower extremity [L03.116] Osteomyelitis of left foot, unspecified type [facility]) [M86.9] Patient Active Problem List   Diagnosis Date Noted  . Polyneuropathy in diabetes ([place]) 03/26/2020  . Cellulitis of left lower extremity   . Osteomyelitis ([place]) 03/25/2020  . Closed fracture of right proximal humerus 10/08/18 10/14/2018  . Special screening for malignant neoplasms, colon   . Hidradenitis 12/13/2015  . Smoker 12/13/2015  . Bipolar I disorder, most recent episode (or current) manic ([place]) 04/01/2015  . Unspecified constipation  06/26/2013  . Nausea and vomiting 06/25/2013  . Cellulitis and abscess of foot 06/23/2013  . DM (diabetes mellitus) ([facility]) 06/23/2013  . HTN (hypertension) 06/23/2013  . Tobacco abuse 06/23/2013  . Hyperlipidemia 06/23/2013   PCP:  Avon Gully, MD Pharmacy:   [facility] [place], [place] - [address] [place]  [address] Phone: [phone] Fax: [phone]     Social Determinants of Health (SDOH) Interventions    Readmission Risk Interventions No flowsheet data found.  Final next level of care: Home/Self Care Barriers to Discharge: No Home Care Agency will accept this patient   Patient Goals and CMS Choice        Discharge Placement                       Discharge Plan and Services In-house Referral: Clinical Social Work   Post Acute Care Choice: Resumption of Svcs/PTA Provider                               Social Determinants of Health (SDOH) Interventions     Readmission Risk Interventions No flowsheet data found.

## 2020-03-28 NOTE — Progress Notes (Signed)
  Subjective: Patient denies any left foot pain.  Objective: Vital signs in last 24 hours: Temp:  [98.3 F (36.8 C)-99.1 F (37.3 C)] 98.8 F (37.1 C) (06/28 0537) Pulse Rate:  [78-87] 81 (06/28 0537) Resp:  [16-18] 16 (06/28 0537) BP: (114-128)/(68-79) 116/77 (06/28 0537) SpO2:  [96 %-100 %] 96 % (06/28 0748) Last BM Date: 03/26/20  Intake/Output from previous day: 06/27 0701 - 06/28 0700 In: 3434.4 [P.O.:1200; I.V.:1489.8; IV Piggyback:744.5] Out: -  Intake/Output this shift: No intake/output data recorded.  General appearance: alert, cooperative and no distress Extremities: Left foot wound healing well.  No purulent drainage noted.  Base of wound without hematoma.  Left foot erythema and edema significantly decreased.  Lab Results:  Recent Labs    03/26/20 0635 03/27/20 0522  WBC 16.7* 7.5  HGB 12.2 11.4*  HCT 35.5* 33.8*  PLT 207 203   BMET Recent Labs    03/27/20 0522 03/28/20 0432  NA 132* 138  K 3.5 4.3  CL 98 103  CO2 25 26  GLUCOSE 192* 172*  BUN 13 13  CREATININE 0.84 0.91  CALCIUM 8.3* 8.4*   PT/INR No results for input(s): LABPROT, INR in the last 72 hours.  Studies/Results: No results found.  Anti-infectives: Anti-infectives (From admission, onward)   Start     Dose/Rate Route Frequency Ordered Stop   03/26/20 0500  vancomycin (VANCOREADY) IVPB 750 mg/150 mL     Discontinue     750 mg 150 mL/hr over 60 Minutes Intravenous Every 12 hours 03/25/20 2116     03/25/20 2300  piperacillin-tazobactam (ZOSYN) IVPB 3.375 g     Discontinue     3.375 g 12.5 mL/hr over 240 Minutes Intravenous Every 8 hours 03/25/20 2116     03/25/20 1530  piperacillin-tazobactam (ZOSYN) IVPB 3.375 g        3.375 g 100 mL/hr over 30 Minutes Intravenous  Once 03/25/20 1521 03/25/20 1635   03/25/20 1530  vancomycin (VANCOCIN) IVPB 1000 mg/200 mL premix        1,000 mg 200 mL/hr over 60 Minutes Intravenous  Once 03/25/20 1522 03/25/20 1737       Assessment/Plan: Impression: Left foot abscess, resolving nicely.  Preliminary cultures reveal staph species, though multiple organisms present.  It is not MRSA at this time.  Blood sugars are improved.  Discussed with Dr. Laural Benes.  May discharge home on Bactrim.  Patient has a home health aide.  I would like to have them pack the left foot wound daily.  Patient is to follow-up in my office in 1 week.  Okay for discharge from surgery standpoint.  LOS: 3 days    Franky Macho 03/28/2020

## 2020-03-28 NOTE — Progress Notes (Signed)
Nsg Discharge Note  Admit Date:  03/25/2020 Discharge date: 03/28/2020   [name] to be D/C'd  Home per MD order.  AVS completed.  Copy for chart, and copy for patient signed, and dated. Patient/caregiver able to verbalize understanding.  Discharge Medication: Allergies as of 03/28/2020   No Known Allergies     Medication List    STOP taking these medications   glipiZIDE 5 MG tablet Commonly known as: GLUCOTROL   metFORMIN 850 MG tablet Commonly known as: GLUCOPHAGE Replaced by: metFORMIN 500 MG 24 hr tablet     TAKE these medications   blood glucose meter kit and supplies Dispense based on patient and insurance preference. Use up to four times daily as directed. (FOR ICD-10 E10.9, E11.9).   insulin glargine 100 UNIT/ML Solostar Pen Commonly known as: LANTUS Inject 10 Units into the skin at bedtime.   insulin lispro 100 UNIT/ML KwikPen Commonly known as: HUMALOG Inject 0.03 mLs (3 Units total) into the skin with breakfast, with lunch, and with evening meal.   Insulin Pen Needle 31G X 5 MM Misc 1 Device by Does not apply route as directed.   lisinopril-hydrochlorothiazide 10-12.5 MG tablet Commonly known as: ZESTORETIC Take 1 tablet by mouth daily.   metFORMIN 500 MG 24 hr tablet Commonly known as: GLUCOPHAGE-XR Take 1 tablet (500 mg total) by mouth 2 (two) times daily with a meal. Replaces: metFORMIN 850 MG tablet   sulfamethoxazole-trimethoprim 800-160 MG tablet Commonly known as: BACTRIM DS Take 1 tablet by mouth every 12 (twelve) hours for 10 days.       Discharge Assessment: Vitals:   03/28/20 0537 03/28/20 0748  BP: 116/77   Pulse: 81   Resp: 16   Temp: 98.8 F (37.1 C)   SpO2: 99% 96%   Skin clean, dry and intact without evidence of skin break down, no evidence of skin tears noted. IV catheter discontinued intact. Site without signs and symptoms of complications - no redness or edema noted at insertion site, patient denies c/o pain - only  slight tenderness at site.  Dressing with slight pressure applied.  D/c Instructions-Education: Discharge instructions given to patient/family with verbalized understanding. D/c education completed with patient/family including follow up instructions, medication list, d/c activities limitations if indicated, with other d/c instructions as indicated by MD - patient able to verbalize understanding, all questions fully answered. Patient instructed to return to ED, call 911, or call MD for any changes in condition.  Patient escorted via [place], and D/C home via private auto.  Zachery Conch, RN 03/28/2020 3:58 PM

## 2020-03-31 LAB — CULTURE, BLOOD (ROUTINE X 2)
Culture: NO GROWTH
Culture: NO GROWTH
Special Requests: ADEQUATE
Special Requests: ADEQUATE

## 2020-04-01 ENCOUNTER — Ambulatory Visit: Payer: Medicaid Other | Admitting: Podiatry

## 2020-04-01 ENCOUNTER — Other Ambulatory Visit: Payer: Self-pay

## 2020-04-01 ENCOUNTER — Ambulatory Visit (INDEPENDENT_AMBULATORY_CARE_PROVIDER_SITE_OTHER): Payer: Medicaid Other

## 2020-04-01 DIAGNOSIS — L97522 Non-pressure chronic ulcer of other part of left foot with fat layer exposed: Secondary | ICD-10-CM | POA: Diagnosis not present

## 2020-04-01 DIAGNOSIS — M79672 Pain in left foot: Secondary | ICD-10-CM

## 2020-04-01 DIAGNOSIS — L02612 Cutaneous abscess of left foot: Secondary | ICD-10-CM

## 2020-04-01 DIAGNOSIS — L02619 Cutaneous abscess of unspecified foot: Secondary | ICD-10-CM

## 2020-04-05 ENCOUNTER — Encounter: Payer: Self-pay | Admitting: Podiatry

## 2020-04-05 NOTE — Progress Notes (Signed)
Subjective:  Patient ID: [name], female    DOB: Dec 30, 1960,  MRN: [id number]  Chief Complaint  Patient presents with  . Foot Pain    pt is here for left foot pain.     59 y.o. female presents for wound care.  Patient presents with left submetatarsal 2 ulceration that has been going on for quite some time.  Patient states that over 2 to 3 weeks has progressive gotten worse.  It is sometimes painful to walk on.  Patient states that she has been keeping it bandaged but has not been able to treat the ulcer.  Patient states he also has been about the same.  She does smoke.  She denies any other acute complaints.  She would like to discuss treatment options.   Review of Systems: Negative except as noted in the HPI. Denies N/V/F/Ch.  Past Medical History:  Diagnosis Date  . Diabetes mellitus without complication ([place])   . Hidradenitis 12/13/2015  . Hypercholesterolemia   . Hypertension   . Smoker 12/13/2015    Current Outpatient Medications:  .  ACCU-CHEK GUIDE test strip, USE TO TEST 3 TIMESDDAILY., Disp: , Rfl:  .  Accu-Chek Softclix Lancets lancets, 3 (three) times daily., Disp: , Rfl:  .  blood glucose meter kit and supplies, Dispense based on patient and insurance preference. Use up to four times daily as directed. (FOR ICD-10 E10.9, E11.9)., Disp: 1 each, Rfl: 0 .  insulin glargine (LANTUS) 100 UNIT/ML Solostar Pen, Inject 10 Units into the skin at bedtime., Disp: 15 mL, Rfl: 1 .  insulin lispro (HUMALOG) 100 UNIT/ML KwikPen, Inject 0.03 mLs (3 Units total) into the skin with breakfast, with lunch, and with evening meal., Disp: 15 mL, Rfl: 1 .  Insulin Pen Needle 31G X 5 MM MISC, 1 Device by Does not apply route as directed., Disp: 100 each, Rfl: 1 .  lisinopril-hydrochlorothiazide (PRINZIDE,ZESTORETIC) 10-12.5 MG tablet, Take 1 tablet by mouth daily., Disp: , Rfl:  .  metFORMIN (GLUCOPHAGE-XR) 500 MG 24 hr tablet, Take 1 tablet (500 mg total) by mouth 2 (two) times daily with  a meal., Disp: 60 tablet, Rfl: 1 .  sulfamethoxazole-trimethoprim (BACTRIM DS) 800-160 MG tablet, Take 1 tablet by mouth every 12 (twelve) hours for 10 days., Disp: 20 tablet, Rfl: 0  Social History   Tobacco Use  Smoking Status Current Every Day Smoker  . Packs/day: 0.50  . Years: 34.00  . Pack years: 17.00  . Types: Cigarettes  Smokeless Tobacco Never Used    No Known Allergies Objective:  There were no vitals filed for this visit. There is no height or weight on file to calculate BMI. Constitutional Well developed. Well nourished.  Vascular Dorsalis pedis pulses palpable bilaterally. Posterior tibial pulses palpable bilaterally. Capillary refill normal to all digits.  No cyanosis or clubbing noted. Pedal hair growth normal.  Neurologic Normal speech. Oriented to person, place, and time. Protective sensation absent  Dermatologic Wound Location:  left submetatarsal 2 ulceration with fat layer exposed.  Probes down to deep tissue/bone.  No clinical signs of infection noted.  No purulent drainage noted.  No malodor present.  No redness present. Wound Base: Mixed Granular/Fibrotic Peri-wound: Calloused Exudate: Scant/small amount Serosanguinous exudate Wound Measurements: -See below  Orthopedic: No pain to palpation either foot.   Radiographs: 3 views of skeletally mature adult left foot: No osteoarthritic changes/cortical destruction consistent with osteomyelitis noted.  Soft tissue defect noted. Assessment:   1. Foot pain, left   2.  Ulcer of left foot with fat layer exposed ([place])    Plan:  Patient was evaluated and treated and all questions answered.  Ulcer submetatarsal 2 with probing down to bone -Debridement as below. -Dressed with Betadine wet-to-dry, DSD. -Continue off-loading with surgical shoe. -Patient is currently on antibiotics we will plan on continuing the antibiotics indefinitely.  If there is no improvement by this clinical visit I will plan on sending  her to the emergency room to be admitted to the hospital for IV antibiotics.  Patient is a high risk of losing the second digit as well as the bone behind it possibly the third digit but not the entire foot.  I explained this with patient extensive detail.  Patient states understanding.  Procedure: Excisional Debridement of Wound Tool: Sharp chisel blade/tissue nipper Rationale: Removal of non-viable soft tissue from the wound to promote healing.  Anesthesia: none Pre-Debridement Wound Measurements: 1 cm x 1 cm x 0.4 cm  Post-Debridement Wound Measurements: 1.1 cm x 1.2 cm x 0.4 cm  Type of Debridement: Sharp Excisional Tissue Removed: Non-viable soft tissue Blood loss: Minimal (<50cc) Depth of Debridement: subcutaneous tissue. Technique: Sharp excisional debridement to bleeding, viable wound base.  Wound Progress: This is my initial encounter I will continue to monitor the progression of it. Site healing conversation 7 Dressing: Dry, sterile, compression dressing. Disposition: Patient tolerated procedure well. Patient to return in 1 week for follow-up.  No follow-ups on file.

## 2020-04-07 ENCOUNTER — Encounter: Payer: Self-pay | Admitting: General Surgery

## 2020-04-07 ENCOUNTER — Ambulatory Visit (INDEPENDENT_AMBULATORY_CARE_PROVIDER_SITE_OTHER): Payer: Medicaid Other | Admitting: General Surgery

## 2020-04-07 ENCOUNTER — Other Ambulatory Visit: Payer: Self-pay

## 2020-04-07 ENCOUNTER — Other Ambulatory Visit: Payer: Self-pay | Admitting: Podiatry

## 2020-04-07 VITALS — BP 120/73 | HR 89 | Temp 97.3°F | Resp 18 | Ht 66.0 in | Wt 164.0 lb

## 2020-04-07 DIAGNOSIS — L02619 Cutaneous abscess of unspecified foot: Secondary | ICD-10-CM

## 2020-04-07 NOTE — Progress Notes (Signed)
Subjective:     [name]  Patient here for follow-up, status post incision and drainage of left foot abscess.  She is also being seen by Dr. Allena Katz of podiatry.  He is addressing the callus on the sole of her foot.  She is finishing up her antibiotic course.  Her home health aide is not doing dressing changes. Objective:    BP 120/73   Pulse 89   Temp (!) 97.3 F (36.3 C) (Temporal)   Resp 18   Ht 5\' 6"  (1.676 m)   Wt 164 lb (74.4 kg)   SpO2 98%   BMI 26.47 kg/m   General:  alert, cooperative and no distress  Left foot incision site healing well.  No purulent drainage.  No erythema or induration noted in the left foot.     Assessment:    Doing well, status post I&D of left foot abscess    Plan:   Finish antibiotic course.  Follow-up here as needed.  Will defer to podiatry any further foot care.

## 2020-04-08 ENCOUNTER — Ambulatory Visit: Payer: Medicaid Other | Admitting: Podiatry

## 2020-04-22 ENCOUNTER — Encounter: Payer: Self-pay | Admitting: Podiatry

## 2020-04-22 ENCOUNTER — Other Ambulatory Visit: Payer: Self-pay

## 2020-04-22 ENCOUNTER — Ambulatory Visit: Payer: Medicaid Other | Admitting: Podiatry

## 2020-04-22 DIAGNOSIS — M79672 Pain in left foot: Secondary | ICD-10-CM

## 2020-04-22 DIAGNOSIS — L97522 Non-pressure chronic ulcer of other part of left foot with fat layer exposed: Secondary | ICD-10-CM

## 2020-04-22 MED ORDER — GABAPENTIN 100 MG PO CAPS
100.0000 mg | ORAL_CAPSULE | Freq: Three times a day (TID) | ORAL | 3 refills | Status: DC
Start: 2020-04-22 — End: 2023-03-05

## 2020-04-22 MED ORDER — DOXYCYCLINE HYCLATE 100 MG PO TABS
100.0000 mg | ORAL_TABLET | Freq: Two times a day (BID) | ORAL | 0 refills | Status: DC
Start: 2020-04-22 — End: 2021-03-11

## 2020-04-22 NOTE — Progress Notes (Signed)
Subjective:  Patient ID: [name], female    DOB: [birth year],  MRN: [id number]  Chief Complaint  Patient presents with   Foot Pain    pt is here for 1 week f/u of osteomyeltis of the left foot.    59 y.o. female presents for wound care.  Patient presents with follow-up of left submetatarsal 2 ulceration.  Patient states she is doing a lot better.  The redness is all resolved.  The wound is closed off.  The interspace is looking a lot better.  However there is still a wound present between the second and the third digit interspace.  She denies any other acute complaints.  They have been ambulating with surgical shoe.  They have been doing Betadine wet-to-dry dressing changes.  No other acute complaints.   Review of Systems: Negative except as noted in the HPI. Denies N/V/F/Ch.  Past Medical History:  Diagnosis Date   Diabetes mellitus without complication ([place])    Hidradenitis 12/13/2015   Hypercholesterolemia    Hypertension    Smoker 12/13/2015    Current Outpatient Medications:    ACCU-CHEK GUIDE test strip, USE TO TEST 3 TIMESDDAILY., Disp: , Rfl:    Accu-Chek Softclix Lancets lancets, 3 (three) times daily., Disp: , Rfl:    blood glucose meter kit and supplies, Dispense based on patient and insurance preference. Use up to four times daily as directed. (FOR ICD-10 E10.9, E11.9)., Disp: 1 each, Rfl: 0   doxycycline (VIBRA-TABS) 100 MG tablet, Take 1 tablet (100 mg total) by mouth 2 (two) times daily., Disp: 24 tablet, Rfl: 0   gabapentin (NEURONTIN) 100 MG capsule, Take 1 capsule (100 mg total) by mouth 3 (three) times daily., Disp: 90 capsule, Rfl: 3   insulin glargine (LANTUS) 100 UNIT/ML Solostar Pen, Inject 10 Units into the skin at bedtime., Disp: 15 mL, Rfl: 1   insulin lispro (HUMALOG) 100 UNIT/ML KwikPen, Inject 0.03 mLs (3 Units total) into the skin with breakfast, with lunch, and with evening meal., Disp: 15 mL, Rfl: 1   Insulin Pen Needle 31G X 5 MM  MISC, 1 Device by Does not apply route as directed., Disp: 100 each, Rfl: 1   lisinopril-hydrochlorothiazide (PRINZIDE,ZESTORETIC) 10-12.5 MG tablet, Take 1 tablet by mouth daily., Disp: , Rfl:    metFORMIN (GLUCOPHAGE-XR) 500 MG 24 hr tablet, Take 1 tablet (500 mg total) by mouth 2 (two) times daily with a meal., Disp: 60 tablet, Rfl: 1  Social History   Tobacco Use  Smoking Status Current Every Day Smoker   Packs/day: 0.50   Years: 34.00   Pack years: 17.00   Types: Cigarettes  Smokeless Tobacco Never Used    No Known Allergies Objective:  There were no vitals filed for this visit. There is no height or weight on file to calculate BMI. Constitutional Well developed. Well nourished.  Vascular Dorsalis pedis pulses palpable bilaterally. Posterior tibial pulses palpable bilaterally. Capillary refill normal to all digits.  No cyanosis or clubbing noted. Pedal hair growth normal.  Neurologic Normal speech. Oriented to person, place, and time. Protective sensation absent  Dermatologic Wound Location: Left submetatarsal 2 ulcer completely reepithelialized no ulcers noted.  Left third interdigital sulcus ulceration noted probing down to deep tissue.  No purulent drainage noted.  Serosanguineous fluid.  No other signs of infection noted. Wound Base: Mixed Granular/Fibrotic Peri-wound: Calloused Exudate: Scant/small amount Serosanguinous exudate Wound Measurements: -See below  Orthopedic: No pain to palpation either foot.   Radiographs: 3 views of skeletally  mature adult left foot: No osteoarthritic changes/cortical destruction consistent with osteomyelitis noted.  Soft tissue defect noted. Assessment:   1. Ulcer of left foot with fat layer exposed ([place])   2. Foot pain, left    Plan:  Patient was evaluated and treated and all questions answered.  Left submetatarsal 2 ulcer is completely reepithelialized and clinically healed.  Left third interdigital sulcus ulceration  probing down to deep tissue improving considerably -Debridement as below. -Dressed with Betadine wet-to-dry, DSD. -Continue off-loading with surgical shoe. -Patient is currently on antibiotics we will plan on continuing the antibiotics indefinitely.  If there is no improvement by this clinical visit I will plan on sending her to the emergency room to be admitted to the hospital for IV antibiotics.  Patient is a high risk of losing the second digit as well as the bone behind it possibly the third digit but not the entire foot.  I explained this with patient extensive detail.  Patient states understanding. -Gabapentin for diabetic neuropathic pain -Continue doxycycline until resolve meant of wound.  Procedure: Excisional Debridement of Wound Tool: Sharp chisel blade/tissue nipper Rationale: Removal of non-viable soft tissue from the wound to promote healing.  Anesthesia: none Pre-Debridement Wound Measurements: 0.3 cm x 0.3 cm x 0.6 cm Post-Debridement Wound Measurements: 0.3 cm x 0.3 cm x 0.7 cm Type of Debridement: Sharp Excisional Tissue Removed: Non-viable soft tissue Blood loss: Minimal (<50cc) Depth of Debridement: subcutaneous tissue. Technique: Sharp excisional debridement to bleeding, viable wound base.  Wound Progress: The wound is decreasing in size considerably. Site healing conversation 7 Dressing: Dry, sterile, compression dressing. Disposition: Patient tolerated procedure well. Patient to return in 1 week for follow-up.  No follow-ups on file.

## 2020-05-06 ENCOUNTER — Ambulatory Visit: Payer: Medicaid Other | Admitting: Podiatry

## 2020-05-20 ENCOUNTER — Other Ambulatory Visit: Payer: Self-pay

## 2020-05-20 ENCOUNTER — Ambulatory Visit (INDEPENDENT_AMBULATORY_CARE_PROVIDER_SITE_OTHER): Payer: Medicaid Other | Admitting: Podiatry

## 2020-05-20 ENCOUNTER — Encounter: Payer: Self-pay | Admitting: Podiatry

## 2020-05-20 DIAGNOSIS — M79672 Pain in left foot: Secondary | ICD-10-CM

## 2020-05-20 DIAGNOSIS — L97522 Non-pressure chronic ulcer of other part of left foot with fat layer exposed: Secondary | ICD-10-CM

## 2020-05-24 ENCOUNTER — Encounter: Payer: Self-pay | Admitting: Podiatry

## 2020-05-24 NOTE — Progress Notes (Signed)
Subjective:  Patient ID: [name], female    DOB: 14-May-1961,  MRN: [id number]  Chief Complaint  Patient presents with  . Callouses    i have a spot on the ball of my left foot and the space between my toes are doing better    59 y.o. female presents for wound care.  Patient presents with a follow-up of left submetatarsal 2 ulceration as well as the left third interdigital space ulceration.  Patient states she is doing a lot better.  It has completely reepithelialized.  No acute complaints.   Review of Systems: Negative except as noted in the HPI. Denies N/V/F/Ch.  Past Medical History:  Diagnosis Date  . Diabetes mellitus without complication ([place])   . Hidradenitis 12/13/2015  . Hypercholesterolemia   . Hypertension   . Smoker 12/13/2015    Current Outpatient Medications:  .  ACCU-CHEK GUIDE test strip, USE TO TEST 3 TIMESDDAILY., Disp: , Rfl:  .  Accu-Chek Softclix Lancets lancets, 3 (three) times daily., Disp: , Rfl:  .  blood glucose meter kit and supplies, Dispense based on patient and insurance preference. Use up to four times daily as directed. (FOR ICD-10 E10.9, E11.9)., Disp: 1 each, Rfl: 0 .  doxycycline (VIBRA-TABS) 100 MG tablet, Take 1 tablet (100 mg total) by mouth 2 (two) times daily., Disp: 24 tablet, Rfl: 0 .  gabapentin (NEURONTIN) 100 MG capsule, Take 1 capsule (100 mg total) by mouth 3 (three) times daily., Disp: 90 capsule, Rfl: 3 .  insulin glargine (LANTUS) 100 UNIT/ML Solostar Pen, Inject 10 Units into the skin at bedtime., Disp: 15 mL, Rfl: 1 .  insulin lispro (HUMALOG) 100 UNIT/ML KwikPen, Inject 0.03 mLs (3 Units total) into the skin with breakfast, with lunch, and with evening meal., Disp: 15 mL, Rfl: 1 .  Insulin Pen Needle 31G X 5 MM MISC, 1 Device by Does not apply route as directed., Disp: 100 each, Rfl: 1 .  lisinopril-hydrochlorothiazide (PRINZIDE,ZESTORETIC) 10-12.5 MG tablet, Take 1 tablet by mouth daily., Disp: , Rfl:  .  metFORMIN  (GLUCOPHAGE-XR) 500 MG 24 hr tablet, Take 1 tablet (500 mg total) by mouth 2 (two) times daily with a meal., Disp: 60 tablet, Rfl: 1  Social History   Tobacco Use  Smoking Status Current Every Day Smoker  . Packs/day: 0.50  . Years: 34.00  . Pack years: 17.00  . Types: Cigarettes  Smokeless Tobacco Never Used    No Known Allergies Objective:  There were no vitals filed for this visit. There is no height or weight on file to calculate BMI. Constitutional Well developed. Well nourished.  Vascular Dorsalis pedis pulses palpable bilaterally. Posterior tibial pulses palpable bilaterally. Capillary refill normal to all digits.  No cyanosis or clubbing noted. Pedal hair growth normal.  Neurologic Normal speech. Oriented to person, place, and time. Protective sensation absent  Dermatologic Wound Location: Left submetatarsal 2 ulcer completely epithelialized as well as left third interdigital sulcus sulcus ulceration as well.  Does not probe down to bone.  No clinical signs of infection noted.  Orthopedic: No pain to palpation either foot.   Radiographs: 3 views of skeletally mature adult left foot: No osteoarthritic changes/cortical destruction consistent with osteomyelitis noted.  Soft tissue defect noted. Assessment:   1. Ulcer of left foot with fat layer exposed ([place])   2. Foot pain, left    Plan:  Patient was evaluated and treated and all questions answered.  Left submetatarsal 2 ulcer is completely reepithelialized and clinically  healed.  Left third interdigital sulcus ulceration probing down to deep tissue improving considerably -Clinically resolved.  The wound is closed and reepithelialized.  No longer probes down to bone.  At this time I will hold off on getting an x-ray as the wound has completely closed off and if there was an active infection present the wound would not. -I discussed the importance of shoe gear modification with the patient extensive detail.  I have also  asked her to evaluate her foot once a day to make sure that no new ulcerations open up. -If any foot and ankle issues arise in the future come back and see me.  Patient states understanding  No follow-ups on file.

## 2020-07-05 ENCOUNTER — Other Ambulatory Visit (HOSPITAL_COMMUNITY): Payer: Self-pay | Admitting: Gerontology

## 2020-07-05 ENCOUNTER — Other Ambulatory Visit: Payer: Self-pay

## 2020-07-05 ENCOUNTER — Other Ambulatory Visit (HOSPITAL_COMMUNITY)
Admission: RE | Admit: 2020-07-05 | Discharge: 2020-07-05 | Disposition: A | Discharge status: Home / Self Care | Payer: Medicaid Other | Source: Ambulatory Visit | Attending: Internal Medicine | Admitting: Internal Medicine

## 2020-07-05 ENCOUNTER — Ambulatory Visit (HOSPITAL_COMMUNITY)
Admission: RE | Admit: 2020-07-05 | Discharge: 2020-07-05 | Disposition: A | Discharge status: Home / Self Care | Payer: Medicaid Other | Source: Ambulatory Visit | Attending: Gerontology | Admitting: Gerontology

## 2020-07-05 DIAGNOSIS — R062 Wheezing: Secondary | ICD-10-CM | POA: Diagnosis present

## 2020-07-05 DIAGNOSIS — E1140 Type 2 diabetes mellitus with diabetic neuropathy, unspecified: Secondary | ICD-10-CM | POA: Insufficient documentation

## 2020-07-05 DIAGNOSIS — Z0001 Encounter for general adult medical examination with abnormal findings: Secondary | ICD-10-CM | POA: Insufficient documentation

## 2020-07-05 DIAGNOSIS — Z79899 Other long term (current) drug therapy: Secondary | ICD-10-CM | POA: Insufficient documentation

## 2020-07-05 DIAGNOSIS — E1165 Type 2 diabetes mellitus with hyperglycemia: Secondary | ICD-10-CM | POA: Insufficient documentation

## 2020-07-05 LAB — CBC WITH DIFFERENTIAL/PLATELET
Abs Immature Granulocytes: 0.03 10*3/uL (ref 0.00–0.07)
Basophils Absolute: 0 10*3/uL (ref 0.0–0.1)
Basophils Relative: 1 %
Eosinophils Absolute: 0.2 10*3/uL (ref 0.0–0.5)
Eosinophils Relative: 2 %
HCT: 49.2 % — ABNORMAL HIGH (ref 36.0–46.0)
Hemoglobin: 16.2 g/dL — ABNORMAL HIGH (ref 12.0–15.0)
Immature Granulocytes: 0 %
Lymphocytes Relative: 30 %
Lymphs Abs: 2.6 10*3/uL (ref 0.7–4.0)
MCH: 31.7 pg (ref 26.0–34.0)
MCHC: 32.9 g/dL (ref 30.0–36.0)
MCV: 96.3 fL (ref 80.0–100.0)
Monocytes Absolute: 0.6 10*3/uL (ref 0.1–1.0)
Monocytes Relative: 7 %
Neutro Abs: 5.2 10*3/uL (ref 1.7–7.7)
Neutrophils Relative %: 60 %
Platelets: 208 10*3/uL (ref 150–400)
RBC: 5.11 MIL/uL (ref 3.87–5.11)
RDW: 11.8 % (ref 11.5–15.5)
WBC: 8.6 10*3/uL (ref 4.0–10.5)
nRBC: 0 % (ref 0.0–0.2)

## 2020-07-05 LAB — HEPATIC FUNCTION PANEL
ALT: 14 U/L (ref 0–44)
AST: 12 U/L — ABNORMAL LOW (ref 15–41)
Albumin: 4 g/dL (ref 3.5–5.0)
Alkaline Phosphatase: 156 U/L — ABNORMAL HIGH (ref 38–126)
Bilirubin, Direct: 0.1 mg/dL (ref 0.0–0.2)
Indirect Bilirubin: 0.7 mg/dL (ref 0.3–0.9)
Total Bilirubin: 0.8 mg/dL (ref 0.3–1.2)
Total Protein: 7 g/dL (ref 6.5–8.1)

## 2020-07-05 LAB — BASIC METABOLIC PANEL
Anion gap: 11 (ref 5–15)
BUN: 11 mg/dL (ref 6–20)
CO2: 29 mmol/L (ref 22–32)
Calcium: 9.1 mg/dL (ref 8.9–10.3)
Chloride: 96 mmol/L — ABNORMAL LOW (ref 98–111)
Creatinine, Ser: 0.87 mg/dL (ref 0.44–1.00)
GFR calc non Af Amer: >60 mL/min (ref >60)
Glucose, Bld: 229 mg/dL — ABNORMAL HIGH (ref 70–99)
Potassium: 4.4 mmol/L (ref 3.5–5.1)
Sodium: 136 mmol/L (ref 135–145)

## 2020-07-05 LAB — LIPID PANEL
Cholesterol: 223 mg/dL — ABNORMAL HIGH (ref 0–200)
HDL: 41 mg/dL (ref >40)
LDL Cholesterol: 135 mg/dL — ABNORMAL HIGH (ref 0–99)
Total CHOL/HDL Ratio: 5.4 RATIO
Triglycerides: 235 mg/dL — ABNORMAL HIGH (ref <150)
VLDL: 47 mg/dL — ABNORMAL HIGH (ref 0–40)

## 2020-07-05 LAB — HEMOGLOBIN A1C
Hgb A1c MFr Bld: 9.9 % — ABNORMAL HIGH (ref 4.8–5.6)
Mean Plasma Glucose: 237.43 mg/dL

## 2020-07-06 LAB — MICROALBUMIN / CREATININE URINE RATIO
Creatinine, Urine: 97.6 mg/dL
Microalb Creat Ratio: 86 mg/g creat — ABNORMAL HIGH (ref 0–29)
Microalb, Ur: 83.5 ug/mL — ABNORMAL HIGH

## 2020-09-27 ENCOUNTER — Other Ambulatory Visit (HOSPITAL_COMMUNITY): Payer: Self-pay | Admitting: Internal Medicine

## 2020-09-27 ENCOUNTER — Other Ambulatory Visit: Payer: Self-pay | Admitting: Internal Medicine

## 2020-09-27 DIAGNOSIS — Z87891 Personal history of nicotine dependence: Secondary | ICD-10-CM

## 2020-10-21 ENCOUNTER — Ambulatory Visit (HOSPITAL_COMMUNITY): Admission: RE | Admit: 2020-10-21 | Payer: Medicaid Other | Source: Ambulatory Visit

## 2020-11-14 ENCOUNTER — Encounter (HOSPITAL_COMMUNITY): Payer: Self-pay

## 2020-11-14 ENCOUNTER — Ambulatory Visit (HOSPITAL_COMMUNITY): Payer: Medicaid Other

## 2020-12-14 ENCOUNTER — Other Ambulatory Visit (HOSPITAL_COMMUNITY): Payer: Self-pay | Admitting: Internal Medicine

## 2020-12-14 DIAGNOSIS — Z1231 Encounter for screening mammogram for malignant neoplasm of breast: Secondary | ICD-10-CM

## 2020-12-23 ENCOUNTER — Ambulatory Visit (HOSPITAL_COMMUNITY): Payer: Medicaid Other

## 2020-12-28 ENCOUNTER — Ambulatory Visit (HOSPITAL_COMMUNITY)
Admission: RE | Admit: 2020-12-28 | Discharge: 2020-12-28 | Disposition: A | Discharge status: Home / Self Care | Payer: Medicaid Other | Source: Ambulatory Visit | Attending: Internal Medicine | Admitting: Internal Medicine

## 2020-12-28 DIAGNOSIS — Z1231 Encounter for screening mammogram for malignant neoplasm of breast: Secondary | ICD-10-CM | POA: Insufficient documentation

## 2021-02-02 ENCOUNTER — Other Ambulatory Visit (HOSPITAL_COMMUNITY)
Admission: RE | Admit: 2021-02-02 | Discharge: 2021-02-02 | Disposition: A | Discharge status: Home / Self Care | Payer: Medicaid Other | Source: Ambulatory Visit | Attending: Adult Health | Admitting: Adult Health

## 2021-02-02 ENCOUNTER — Encounter: Payer: Self-pay | Admitting: Adult Health

## 2021-02-02 ENCOUNTER — Ambulatory Visit (INDEPENDENT_AMBULATORY_CARE_PROVIDER_SITE_OTHER): Payer: Medicaid Other | Admitting: Adult Health

## 2021-02-02 ENCOUNTER — Other Ambulatory Visit: Payer: Self-pay

## 2021-02-02 VITALS — BP 140/77 | HR 102 | Temp 98.2°F | Ht 64.25 in | Wt 154.0 lb

## 2021-02-02 DIAGNOSIS — Z0000 Encounter for general adult medical examination without abnormal findings: Secondary | ICD-10-CM | POA: Insufficient documentation

## 2021-02-02 DIAGNOSIS — Z01419 Encounter for gynecological examination (general) (routine) without abnormal findings: Secondary | ICD-10-CM

## 2021-02-02 DIAGNOSIS — N3946 Mixed incontinence: Secondary | ICD-10-CM | POA: Diagnosis not present

## 2021-02-02 DIAGNOSIS — Z1211 Encounter for screening for malignant neoplasm of colon: Secondary | ICD-10-CM | POA: Insufficient documentation

## 2021-02-02 LAB — HEMOCCULT GUIAC POC 1CARD (OFFICE): Fecal Occult Blood, POC: NEGATIVE

## 2021-02-02 MED ORDER — OXYBUTYNIN CHLORIDE ER 10 MG PO TB24: 10.0000 mg | ORAL_TABLET | Freq: Every day | ORAL | 3 refills | Status: DC

## 2021-02-02 NOTE — Progress Notes (Signed)
Patient ID: [name], female   DOB: Feb 09, 1961, 60 y.o.   MRN: [id number] History of Present Illness: [name] is a 60 year old white female, divorced, PM in for a well woman gyn exam and pap.Complains of bladder leakage. PCP is Dr Felecia Shelling.   Current Medications, Allergies, Past Medical History, Past Surgical History, Family History and Social History were reviewed in [employer] record.     Review of Systems: Patient denies any headaches, hearing loss, fatigue, blurred vision, shortness of breath, chest pain, abdominal pain, problems with bowel movements,  or intercourse(not active). No joint pain or mood swings. Has stress and urge incontinence  Denies any itching or burning    Physical Exam:BP 140/77 (BP Location: Left Arm, Patient Position: Sitting, Cuff Size: Normal)   Pulse (!) 102   Temp 98.2 F (36.8 C)   Ht 5' 4.25" (1.632 m)   Wt 154 lb (69.9 kg)   BMI 26.23 kg/m  General:  Well developed, well nourished, no acute distress Skin:  Warm and dry Neck:  Midline trachea, normal thyroid, good ROM, no lymphadenopathy Lungs; Clear to auscultation bilaterally Breast:  No dominant palpable mass, retraction, or nipple discharge, has scaring from hidradenitis  Cardiovascular: Regular rate and rhythm Abdomen:  Soft, non tender, no hepatosplenomegaly Pelvic:  External genitalia is normal in appearance, no lesions,labia shows moisture changes.  The vagina is pale. Urethra has no lesions or masses. The cervix is smooth, pap with HR HPV genotyping performed.   Uterus is felt to be normal size, shape, and contour.  No adnexal masses or tenderness noted.Bladder is non tender, no masses felt. Rectal: Good sphincter tone, no polyps, or hemorrhoids felt.  Hemoccult negative. Extremities/musculoskeletal:  No swelling or varicosities noted, no clubbing or cyanosis Psych:  No mood changes, alert and cooperative,seems happy AA is 0 Fall risk is low PHQ 9 score is 3 GAD  7 score is 0  Upstream - 02/02/21 1029      Pregnancy Intention Screening   Does the patient want to become pregnant in the next year? N/A    Does the patient's partner want to become pregnant in the next year? N/A    Would the patient like to discuss contraceptive options today? N/A      Contraception Wrap Up   Current Method Abstinence   PM   End Method Abstinence   PM   Contraception Counseling Provided No         Examination chaperoned by Malachy Mood LPN  Impression and Plan: 1. Routine general medical examination at a health care facility Pap sent   2. Encounter for gynecological examination with Papanicolaou smear of cervix Pap sent Physical in 1 year Pap in 3 if normal  Labs with PCP Colonoscopy advised Get mammogram 2024  3. Encounter for screening fecal occult blood testing   4. Mixed stress and urge urinary incontinence Will try ditropan,call me if its helps and needs refills later Meds ordered this encounter  Medications  . oxybutynin (DITROPAN-XL) 10 MG 24 hr tablet    Sig: Take 1 tablet (10 mg total) by mouth at bedtime.    Dispense:  30 tablet    Refill:  3    Order Specific Question:   Supervising Provider    Answer:   Duane Lope H [[id number]]

## 2021-02-06 LAB — CYTOLOGY - PAP
Comment: NEGATIVE
Diagnosis: NEGATIVE
High risk HPV: NEGATIVE

## 2021-03-07 ENCOUNTER — Emergency Department (HOSPITAL_COMMUNITY)
Admission: EM | Admit: 2021-03-07 | Discharge: 2021-03-07 | Discharge status: Against Medical Advice | Payer: Medicaid Other

## 2021-03-10 ENCOUNTER — Encounter (HOSPITAL_COMMUNITY): Payer: Self-pay | Admitting: *Deleted

## 2021-03-10 ENCOUNTER — Emergency Department (HOSPITAL_COMMUNITY): Payer: Medicaid Other

## 2021-03-10 ENCOUNTER — Ambulatory Visit (INDEPENDENT_AMBULATORY_CARE_PROVIDER_SITE_OTHER): Payer: Medicaid Other | Admitting: Podiatry

## 2021-03-10 ENCOUNTER — Inpatient Hospital Stay (HOSPITAL_COMMUNITY)
Admission: EM | Admit: 2021-03-10 | Discharge: 2021-03-12 | DRG: 240 | Disposition: A | Discharge status: Home / Self Care | Payer: Medicaid Other | Attending: Family Medicine | Admitting: Family Medicine

## 2021-03-10 ENCOUNTER — Inpatient Hospital Stay (HOSPITAL_COMMUNITY): Payer: Medicaid Other

## 2021-03-10 ENCOUNTER — Other Ambulatory Visit: Payer: Self-pay

## 2021-03-10 ENCOUNTER — Encounter: Payer: Self-pay | Admitting: Podiatry

## 2021-03-10 DIAGNOSIS — I10 Essential (primary) hypertension: Secondary | ICD-10-CM | POA: Diagnosis present

## 2021-03-10 DIAGNOSIS — E876 Hypokalemia: Secondary | ICD-10-CM | POA: Diagnosis not present

## 2021-03-10 DIAGNOSIS — E785 Hyperlipidemia, unspecified: Secondary | ICD-10-CM | POA: Diagnosis present

## 2021-03-10 DIAGNOSIS — E11628 Type 2 diabetes mellitus with other skin complications: Secondary | ICD-10-CM | POA: Diagnosis not present

## 2021-03-10 DIAGNOSIS — M79672 Pain in left foot: Secondary | ICD-10-CM | POA: Diagnosis present

## 2021-03-10 DIAGNOSIS — E1142 Type 2 diabetes mellitus with diabetic polyneuropathy: Secondary | ICD-10-CM | POA: Diagnosis not present

## 2021-03-10 DIAGNOSIS — Z79899 Other long term (current) drug therapy: Secondary | ICD-10-CM

## 2021-03-10 DIAGNOSIS — L89312 Pressure ulcer of right buttock, stage 2: Secondary | ICD-10-CM | POA: Diagnosis present

## 2021-03-10 DIAGNOSIS — E1152 Type 2 diabetes mellitus with diabetic peripheral angiopathy with gangrene: Principal | ICD-10-CM | POA: Diagnosis present

## 2021-03-10 DIAGNOSIS — Z7984 Long term (current) use of oral hypoglycemic drugs: Secondary | ICD-10-CM

## 2021-03-10 DIAGNOSIS — I96 Gangrene, not elsewhere classified: Secondary | ICD-10-CM | POA: Diagnosis not present

## 2021-03-10 DIAGNOSIS — Z833 Family history of diabetes mellitus: Secondary | ICD-10-CM

## 2021-03-10 DIAGNOSIS — Z825 Family history of asthma and other chronic lower respiratory diseases: Secondary | ICD-10-CM

## 2021-03-10 DIAGNOSIS — L03116 Cellulitis of left lower limb: Secondary | ICD-10-CM | POA: Diagnosis present

## 2021-03-10 DIAGNOSIS — L03119 Cellulitis of unspecified part of limb: Secondary | ICD-10-CM | POA: Diagnosis not present

## 2021-03-10 DIAGNOSIS — L97426 Non-pressure chronic ulcer of left heel and midfoot with bone involvement without evidence of necrosis: Secondary | ICD-10-CM | POA: Diagnosis present

## 2021-03-10 DIAGNOSIS — L089 Local infection of the skin and subcutaneous tissue, unspecified: Secondary | ICD-10-CM | POA: Diagnosis not present

## 2021-03-10 DIAGNOSIS — M869 Osteomyelitis, unspecified: Secondary | ICD-10-CM | POA: Diagnosis present

## 2021-03-10 DIAGNOSIS — Z794 Long term (current) use of insulin: Secondary | ICD-10-CM

## 2021-03-10 DIAGNOSIS — Z83438 Family history of other disorder of lipoprotein metabolism and other lipidemia: Secondary | ICD-10-CM

## 2021-03-10 DIAGNOSIS — R8281 Pyuria: Secondary | ICD-10-CM | POA: Diagnosis present

## 2021-03-10 DIAGNOSIS — Z20822 Contact with and (suspected) exposure to covid-19: Secondary | ICD-10-CM | POA: Diagnosis present

## 2021-03-10 DIAGNOSIS — E11621 Type 2 diabetes mellitus with foot ulcer: Secondary | ICD-10-CM | POA: Diagnosis present

## 2021-03-10 DIAGNOSIS — F17210 Nicotine dependence, cigarettes, uncomplicated: Secondary | ICD-10-CM | POA: Diagnosis present

## 2021-03-10 DIAGNOSIS — Z803 Family history of malignant neoplasm of breast: Secondary | ICD-10-CM

## 2021-03-10 DIAGNOSIS — Z8249 Family history of ischemic heart disease and other diseases of the circulatory system: Secondary | ICD-10-CM | POA: Diagnosis not present

## 2021-03-10 DIAGNOSIS — Z823 Family history of stroke: Secondary | ICD-10-CM | POA: Diagnosis not present

## 2021-03-10 DIAGNOSIS — L97422 Non-pressure chronic ulcer of left heel and midfoot with fat layer exposed: Secondary | ICD-10-CM | POA: Diagnosis not present

## 2021-03-10 DIAGNOSIS — M86172 Other acute osteomyelitis, left ankle and foot: Secondary | ICD-10-CM | POA: Diagnosis present

## 2021-03-10 DIAGNOSIS — L97522 Non-pressure chronic ulcer of other part of left foot with fat layer exposed: Secondary | ICD-10-CM | POA: Diagnosis not present

## 2021-03-10 DIAGNOSIS — L97509 Non-pressure chronic ulcer of other part of unspecified foot with unspecified severity: Secondary | ICD-10-CM | POA: Diagnosis not present

## 2021-03-10 DIAGNOSIS — L0390 Cellulitis, unspecified: Secondary | ICD-10-CM | POA: Diagnosis not present

## 2021-03-10 DIAGNOSIS — E119 Type 2 diabetes mellitus without complications: Secondary | ICD-10-CM

## 2021-03-10 DIAGNOSIS — E7800 Pure hypercholesterolemia, unspecified: Secondary | ICD-10-CM | POA: Diagnosis present

## 2021-03-10 DIAGNOSIS — E1169 Type 2 diabetes mellitus with other specified complication: Secondary | ICD-10-CM | POA: Diagnosis present

## 2021-03-10 DIAGNOSIS — Z89431 Acquired absence of right foot: Secondary | ICD-10-CM

## 2021-03-10 DIAGNOSIS — L97529 Non-pressure chronic ulcer of other part of left foot with unspecified severity: Secondary | ICD-10-CM | POA: Diagnosis present

## 2021-03-10 LAB — APTT: aPTT: 32 seconds (ref 24–36)

## 2021-03-10 LAB — URINALYSIS, ROUTINE W REFLEX MICROSCOPIC
Bilirubin Urine: NEGATIVE
Glucose, UA: NEGATIVE mg/dL
Ketones, ur: NEGATIVE mg/dL
Nitrite: POSITIVE — AB
Protein, ur: 30 mg/dL — AB
Specific Gravity, Urine: 1.014 (ref 1.005–1.030)
WBC, UA: >50 WBC/hpf — ABNORMAL HIGH (ref 0–5)
pH: 5 (ref 5.0–8.0)

## 2021-03-10 LAB — SEDIMENTATION RATE: Sed Rate: 82 mm/hr — ABNORMAL HIGH (ref 0–22)

## 2021-03-10 LAB — CBC WITH DIFFERENTIAL/PLATELET
Abs Immature Granulocytes: 0.08 10*3/uL — ABNORMAL HIGH (ref 0.00–0.07)
Basophils Absolute: 0 10*3/uL (ref 0.0–0.1)
Basophils Relative: 0 %
Eosinophils Absolute: 0.1 10*3/uL (ref 0.0–0.5)
Eosinophils Relative: 1 %
HCT: 31.9 % — ABNORMAL LOW (ref 36.0–46.0)
Hemoglobin: 10.6 g/dL — ABNORMAL LOW (ref 12.0–15.0)
Immature Granulocytes: 1 %
Lymphocytes Relative: 15 %
Lymphs Abs: 1.9 10*3/uL (ref 0.7–4.0)
MCH: 30.8 pg (ref 26.0–34.0)
MCHC: 33.2 g/dL (ref 30.0–36.0)
MCV: 92.7 fL (ref 80.0–100.0)
Monocytes Absolute: 0.9 10*3/uL (ref 0.1–1.0)
Monocytes Relative: 7 %
Neutro Abs: 9.5 10*3/uL — ABNORMAL HIGH (ref 1.7–7.7)
Neutrophils Relative %: 76 %
Platelets: 372 10*3/uL (ref 150–400)
RBC: 3.44 MIL/uL — ABNORMAL LOW (ref 3.87–5.11)
RDW: 11.7 % (ref 11.5–15.5)
WBC: 12.5 10*3/uL — ABNORMAL HIGH (ref 4.0–10.5)
nRBC: 0 % (ref 0.0–0.2)

## 2021-03-10 LAB — COMPREHENSIVE METABOLIC PANEL
ALT: 27 U/L (ref 0–44)
AST: 25 U/L (ref 15–41)
Albumin: 2.9 g/dL — ABNORMAL LOW (ref 3.5–5.0)
Alkaline Phosphatase: 113 U/L (ref 38–126)
Anion gap: 12 (ref 5–15)
BUN: 26 mg/dL — ABNORMAL HIGH (ref 6–20)
CO2: 26 mmol/L (ref 22–32)
Calcium: 9 mg/dL (ref 8.9–10.3)
Chloride: 95 mmol/L — ABNORMAL LOW (ref 98–111)
Creatinine, Ser: 1.15 mg/dL — ABNORMAL HIGH (ref 0.44–1.00)
GFR, Estimated: 55 mL/min — ABNORMAL LOW (ref >60)
Glucose, Bld: 109 mg/dL — ABNORMAL HIGH (ref 70–99)
Potassium: 3.3 mmol/L — ABNORMAL LOW (ref 3.5–5.1)
Sodium: 133 mmol/L — ABNORMAL LOW (ref 135–145)
Total Bilirubin: 0.6 mg/dL (ref 0.3–1.2)
Total Protein: 6.8 g/dL (ref 6.5–8.1)

## 2021-03-10 LAB — RESP PANEL BY RT-PCR (FLU A&B, COVID) ARPGX2
Influenza A by PCR: NEGATIVE
Influenza B by PCR: NEGATIVE
SARS Coronavirus 2 by RT PCR: NEGATIVE

## 2021-03-10 LAB — GLUCOSE, CAPILLARY: Glucose-Capillary: 179 mg/dL — ABNORMAL HIGH (ref 70–99)

## 2021-03-10 LAB — PROTIME-INR
INR: 1.1 (ref 0.8–1.2)
Prothrombin Time: 13.8 seconds (ref 11.4–15.2)

## 2021-03-10 LAB — PREALBUMIN: Prealbumin: 7 mg/dL — ABNORMAL LOW (ref 18–38)

## 2021-03-10 LAB — LACTIC ACID, PLASMA
Lactic Acid, Venous: 0.8 mmol/L (ref 0.5–1.9)
Lactic Acid, Venous: 0.8 mmol/L (ref 0.5–1.9)

## 2021-03-10 LAB — C-REACTIVE PROTEIN: CRP: 16.1 mg/dL — ABNORMAL HIGH (ref <1.0)

## 2021-03-10 MED ORDER — LACTATED RINGERS IV SOLN: INTRAVENOUS | Status: DC

## 2021-03-10 MED ORDER — ACETAMINOPHEN 325 MG PO TABS
650.0000 mg | ORAL_TABLET | Freq: Four times a day (QID) | ORAL | Status: DC | PRN
  Administered 2021-03-11 – 2021-03-12 (×3): 650 mg via ORAL
  Filled 2021-03-10 (×3): qty 2

## 2021-03-10 MED ORDER — VANCOMYCIN HCL 1000 MG/200ML IV SOLN
1000.0000 mg | INTRAVENOUS | Status: DC
  Administered 2021-03-11: 1000 mg via INTRAVENOUS
  Filled 2021-03-10: qty 200

## 2021-03-10 MED ORDER — ACETAMINOPHEN 650 MG RE SUPP: 650.0000 mg | Freq: Four times a day (QID) | RECTAL | Status: DC | PRN

## 2021-03-10 MED ORDER — SODIUM CHLORIDE 0.9 % IV BOLUS
1000.0000 mL | Freq: Once | INTRAVENOUS | Status: AC
  Administered 2021-03-10: 1000 mL via INTRAVENOUS

## 2021-03-10 MED ORDER — SODIUM CHLORIDE 0.9 % IV SOLN
2.0000 g | Freq: Two times a day (BID) | INTRAVENOUS | Status: DC
  Administered 2021-03-10: 2 g via INTRAVENOUS
  Filled 2021-03-10: qty 2

## 2021-03-10 MED ORDER — VANCOMYCIN HCL 1250 MG/250ML IV SOLN
1250.0000 mg | Freq: Once | INTRAVENOUS | Status: AC
  Administered 2021-03-10: 1250 mg via INTRAVENOUS
  Filled 2021-03-10: qty 250

## 2021-03-10 MED ORDER — INSULIN ASPART 100 UNIT/ML IJ SOLN
0.0000 [IU] | INTRAMUSCULAR | Status: DC
  Administered 2021-03-10 – 2021-03-11 (×2): 2 [IU] via SUBCUTANEOUS
  Administered 2021-03-11: 3 [IU] via SUBCUTANEOUS
  Administered 2021-03-11: 2 [IU] via SUBCUTANEOUS
  Administered 2021-03-12: 1 [IU] via SUBCUTANEOUS
  Administered 2021-03-12: 2 [IU] via SUBCUTANEOUS
  Administered 2021-03-12: 1 [IU] via SUBCUTANEOUS

## 2021-03-10 MED ORDER — METRONIDAZOLE 500 MG PO TABS
500.0000 mg | ORAL_TABLET | Freq: Three times a day (TID) | ORAL | Status: DC
  Administered 2021-03-10 – 2021-03-11 (×4): 500 mg via ORAL
  Filled 2021-03-10 (×4): qty 1

## 2021-03-10 MED ORDER — ONDANSETRON HCL 4 MG PO TABS: 4.0000 mg | ORAL_TABLET | Freq: Four times a day (QID) | ORAL | Status: DC | PRN

## 2021-03-10 MED ORDER — ONDANSETRON HCL 4 MG/2ML IJ SOLN: 4.0000 mg | Freq: Four times a day (QID) | INTRAMUSCULAR | Status: DC | PRN

## 2021-03-10 MED ORDER — SODIUM CHLORIDE 0.9 % IV SOLN
1.0000 g | INTRAVENOUS | Status: DC
  Administered 2021-03-11: 1 g via INTRAVENOUS
  Filled 2021-03-10: qty 1
  Filled 2021-03-10: qty 10

## 2021-03-10 MED ORDER — SODIUM CHLORIDE 0.9 % IV SOLN: 2.0000 g | INTRAVENOUS | Status: DC

## 2021-03-10 MED ORDER — VANCOMYCIN HCL 1000 MG/200ML IV SOLN
1000.0000 mg | INTRAVENOUS | Status: DC
  Filled 2021-03-10: qty 200

## 2021-03-10 MED ORDER — NICOTINE 14 MG/24HR TD PT24
14.0000 mg | MEDICATED_PATCH | Freq: Every day | TRANSDERMAL | Status: DC
  Administered 2021-03-11 – 2021-03-12 (×2): 14 mg via TRANSDERMAL
  Filled 2021-03-10 (×2): qty 1

## 2021-03-10 NOTE — Progress Notes (Signed)
Pharmacy Antibiotic Note  [name] is a 60 y.o. female admitted on 03/10/2021 with  diabetic foot infection .  Pharmacy has been consulted for vancomycin dosing. Of note, patient is to continue ceftriaxone for gram negative coverage   WBC 12.5, SCr 1.15  Plan: -Vancomycin 1250 mg IV load followed by Vancomycin 1000 mg IV Q 24 hrs. Goal AUC 400-550. Expected AUC: 453 SCr used: 1.15 -Ceftriaxone per MD -Monitor CBC, renal fx, cultures and clinical progress -Vanc levels as indicated    Height: 5' 5.25" (165.7 cm) Weight: 69.9 kg (154 lb 1.6 oz) IBW/kg (Calculated) : 57.58  Temp (24hrs), Avg:99.1 F (37.3 C), Min:99.1 F (37.3 C), Max:99.1 F (37.3 C)  Recent Labs  Lab 03/10/21 1555 03/10/21 1755  WBC 12.5*  --   CREATININE 1.15*  --   LATICACIDVEN 0.8 0.8    Estimated Creatinine Clearance: 52 mL/min (A) (by C-G formula based on SCr of 1.15 mg/dL (H)).    No Known Allergies  Antimicrobials this admission: Vancomycin 6/10 >>  Ceftriaxone 6/10 >>  Flagyl 6/10 >>   Dose adjustments this admission:   Microbiology results: 6/10 BCx:  6/10 UCx:     Thank you for allowing pharmacy to be a part of this patient's care.  Vinnie Level, PharmD., BCPS, BCCCP Clinical Pharmacist Please refer to [facility] for unit-specific pharmacist

## 2021-03-10 NOTE — ED Provider Notes (Signed)
Emergency Medicine Provider Triage Evaluation Note  MADELIENE Rivera , a 60 y.o. female  was evaluated in triage.  Pt unable to provide history, she does not know why she is here  Per chart review, pt was sent here after being seen by her podiatrist. He recommended that she come to the ED for admission for IV abx due to concern for possible osteomyelitis  Review of Systems  Positive: Foot wound, fever Negative: vomiting  Physical Exam  BP (!) 97/57 (BP Location: Right Arm)   Pulse 80   Temp 99.1 F (37.3 C) (Oral)   Resp 16   Ht 5' 5.25" (1.657 m)   Wt 69.9 kg   SpO2 97%   BMI 25.45 kg/m  Gen:   Awake, no distress   Resp:  Normal effort  MSK:   Moves extremities without difficulty  Other:  Deep ulceration to the left foot with surrounding erythema, warmth and induration  Medical Decision Making  Medically screening exam initiated at 4:00 PM.  Appropriate orders placed.  KYNEDI PROFITT was informed that the remainder of the evaluation will be completed by another provider, this initial triage assessment does not replace that evaluation, and the importance of remaining in the ED until their evaluation is complete.     Karrie Meres, PA-C 03/10/21 1600    Eber Hong, MD 03/10/21 1620

## 2021-03-10 NOTE — ED Triage Notes (Signed)
The ot does not why shes here she thinks she is being seen for her lt foot  that has a ortho shoe in place

## 2021-03-10 NOTE — ED Notes (Signed)
Son [phone] would like an update

## 2021-03-10 NOTE — ED Provider Notes (Signed)
[facility] EMERGENCY DEPARTMENT Provider Note   CSN: [id number] Arrival date & time: 03/10/21  1542     History Chief Complaint  Patient presents with   Foot Pain    [name] is a 60 y.o. female with to left foot that has been an ongoing issue.  She has been following with Dr. Posey Pronto (podiatry) for management of her wound.  She states that she has been doing all the measures that he has recommended but felt like over the last few days, the wound had gotten worse.  She saw Dr. Posey Pronto today for evaluation and was recommended to come to the ED for admission, IV antibiotics given concerns of infection, osteomyelitis.  She reports that she has felt hot at home and has had to take Tylenol but has not measured a fever.  She reports that the right foot has become increasingly more painful, swollen, red.  She states that she has been checking her blood sugars at home and sometimes they run low.  She denies any chest pain, difficulty breathing, abdominal pain, numbness.  The history is provided by the patient and medical records.      Past Medical History:  Diagnosis Date   Diabetes mellitus without complication ([place])    Hidradenitis 12/13/2015   Hypercholesterolemia    Hypertension    Smoker 12/13/2015    Patient Active Problem List   Diagnosis Date Noted   Diabetic infection of left foot ([place]) 03/10/2021   DM2 (diabetes mellitus, type 2) ([place]) 03/10/2021   Diabetic ulcer of left foot ([place]) 03/10/2021   Mixed stress and urge urinary incontinence 02/02/2021   Encounter for screening fecal occult blood testing 02/02/2021   Routine general medical examination at a health care facility 02/02/2021   Polyneuropathy in diabetes ([place]) 03/26/2020   Cellulitis of left lower extremity    Osteomyelitis ([place]) 03/25/2020   Closed fracture of right proximal humerus 10/08/18 10/14/2018   Special screening for malignant neoplasms, colon    Hidradenitis 12/13/2015   Smoker  12/13/2015   Bipolar I disorder, most recent episode (or current) manic ([place]) 04/01/2015   Unspecified constipation 06/26/2013   Nausea and vomiting 06/25/2013   Cellulitis and abscess of foot 06/23/2013   DM (diabetes mellitus) ([place]) 06/23/2013   HTN (hypertension) 06/23/2013   Tobacco abuse 06/23/2013   Hyperlipidemia 06/23/2013    Past Surgical History:  Procedure Laterality Date   FLEXIBLE SIGMOIDOSCOPY N/A 09/30/2017   Procedure: FLEXIBLE SIGMOIDOSCOPY;  Surgeon: Danie Binder, MD;  Location: [facility];  Service: Endoscopy;  Laterality: N/A;     OB History     Gravida  1   Para  1   Term  1   Preterm      AB      Living  1      SAB      IAB      Ectopic      Multiple      Live Births  1           Family History  Problem Relation Age of Onset   Asthma Mother    Cancer Mother    Hypertension Mother    Diabetes Mother    Hypertension Father    Diabetes Father    Other Brother        hit by car   Breast cancer Sister    Stroke Brother    Hypertension Maternal Grandmother    Diabetes Maternal Grandmother  Hyperlipidemia Maternal Grandmother    Hypertension Maternal Grandfather    Hyperlipidemia Maternal Grandfather    Hypertension Paternal Grandmother    Hyperlipidemia Paternal Grandmother    Hypertension Paternal Grandfather    Hyperlipidemia Paternal Grandfather     Social History   Tobacco Use   Smoking status: Every Day    Packs/day: 0.50    Years: 34.00    Pack years: 17.00    Types: Cigarettes   Smokeless tobacco: Never  Vaping Use   Vaping Use: Never used  Substance Use Topics   Alcohol use: No   Drug use: No    Home Medications Prior to Admission medications   Medication Sig Start Date End Date Taking? Authorizing Provider  ACCU-CHEK GUIDE test strip USE TO TEST 3 TIMESDDAILY. 03/30/20   [provider]  Accu-Chek Softclix Lancets lancets 3 (three) times daily. 03/30/20   [provider]   atorvastatin (LIPITOR) 40 MG tablet Take 1 tablet by mouth daily. 10/05/20   [provider]  blood glucose meter kit and supplies Dispense based on patient and insurance preference. Use up to four times daily as directed. (FOR ICD-10 E10.9, E11.9). 03/28/20   Johnson, Clanford L, MD  diphenhydrAMINE HCl (BENADRYL PO) Take by mouth.    [provider]  doxycycline (VIBRA-TABS) 100 MG tablet Take 1 tablet (100 mg total) by mouth 2 (two) times daily. 04/22/20   Felipa Furnace, DPM  gabapentin (NEURONTIN) 100 MG capsule Take 1 capsule (100 mg total) by mouth 3 (three) times daily. 04/22/20   Felipa Furnace, DPM  glipiZIDE (GLUCOTROL) 5 MG tablet Take 5 mg by mouth 2 (two) times daily. 12/06/20   [provider]  insulin glargine (LANTUS) 100 UNIT/ML Solostar Pen Inject 10 Units into the skin at bedtime. 03/28/20   Johnson, Clanford L, MD  insulin lispro (HUMALOG) 100 UNIT/ML KwikPen Inject 0.03 mLs (3 Units total) into the skin with breakfast, with lunch, and with evening meal. 03/28/20   Johnson, Clanford L, MD  Insulin Pen Needle 31G X 5 MM MISC 1 Device by Does not apply route as directed. 03/28/20   Johnson, Clanford L, MD  lisinopril-hydrochlorothiazide (PRINZIDE,ZESTORETIC) 10-12.5 MG tablet Take 1 tablet by mouth daily.    [provider]  metFORMIN (GLUCOPHAGE) 850 MG tablet Take 850 mg by mouth 2 (two) times daily. 01/25/21   [provider]  oxybutynin (DITROPAN-XL) 10 MG 24 hr tablet Take 1 tablet (10 mg total) by mouth at bedtime. 02/02/21   Estill Dooms, NP  PROAIR HFA 108 [phone] Base) MCG/ACT inhaler SMARTSIG:2 Puff(s) Via Inhaler 4 Times Daily PRN 12/06/20   [provider]    Allergies    Patient has no known allergies.  Review of Systems   Review of Systems  Constitutional:  Positive for fever.  Respiratory:  Negative for cough and shortness of breath.   Cardiovascular:  Negative for chest pain.  Gastrointestinal:  Negative for  abdominal pain, nausea and vomiting.  Genitourinary:  Negative for dysuria and hematuria.  Musculoskeletal:        Left foot pain  Skin:  Positive for color change and wound.  Neurological:  Negative for headaches.  All other systems reviewed and are negative.  Physical Exam Updated Vital Signs BP 121/62   Pulse 81   Temp 99.1 F (37.3 C) (Oral)   Resp (!) 21   Ht 5' 5.25" (1.657 m)   Wt 69.9 kg   SpO2 99%   BMI  25.45 kg/m   Physical Exam Vitals and nursing note reviewed.  Constitutional:      Appearance: Normal appearance. She is well-developed.  HENT:     Head: Normocephalic and atraumatic.  Eyes:     General: Lids are normal.     Conjunctiva/sclera: Conjunctivae normal.     Pupils: Pupils are equal, round, and reactive to light.  Cardiovascular:     Rate and Rhythm: Normal rate and regular rhythm.     Pulses:          Dorsalis pedis pulses are 2+ on the right side and 1+ on the left side.     Heart sounds: Normal heart sounds. No murmur heard.   No friction rub. No gallop.  Pulmonary:     Effort: Pulmonary effort is normal.     Breath sounds: Normal breath sounds.     Comments: Lungs clear to auscultation bilaterally.  Symmetric chest rise.  No wheezing, rales, rhonchi. Abdominal:     Palpations: Abdomen is soft. Abdomen is not rigid.     Tenderness: There is no abdominal tenderness. There is no guarding.     Comments: Abdomen is soft, non-distended, non-tender. No rigidity, No guarding. No peritoneal signs.  Musculoskeletal:        General: Normal range of motion.     Cervical back: Full passive range of motion without pain.     Comments: Diffuse soft tissue swelling, warmth, redness noted to the left foot.  There is evidence of swelling noted to the left second toe with a ulcer noted to the plantar surface that is about the size of a dime.  Skin:    General: Skin is warm and dry.     Capillary Refill: Capillary refill takes 2 to 3 seconds.     Comments: LLE is  not dusky in appearance or cool to touch. Slightly delayed cap refill but present.   Neurological:     Mental Status: She is alert and oriented to person, place, and time.  Psychiatric:        Speech: Speech normal.          ED Results / Procedures / Treatments   Labs (all labs ordered are listed, but only abnormal results are displayed) Labs Reviewed  COMPREHENSIVE METABOLIC PANEL - Abnormal; Notable for the following components:      Result Value   Sodium 133 (*)    Potassium 3.3 (*)    Chloride 95 (*)    Glucose, Bld 109 (*)    BUN 26 (*)    Creatinine, Ser 1.15 (*)    Albumin 2.9 (*)    GFR, Estimated 55 (*)    All other components within normal limits  CBC WITH DIFFERENTIAL/PLATELET - Abnormal; Notable for the following components:   WBC 12.5 (*)    RBC 3.44 (*)    Hemoglobin 10.6 (*)    HCT 31.9 (*)    Neutro Abs 9.5 (*)    Abs Immature Granulocytes 0.08 (*)    All other components within normal limits  RESP PANEL BY RT-PCR (FLU A&B, COVID) [id number]  CULTURE, BLOOD (ROUTINE X 2)  CULTURE, BLOOD (ROUTINE X 2)  URINE CULTURE  LACTIC ACID, PLASMA  LACTIC ACID, PLASMA  PROTIME-INR  APTT  URINALYSIS, ROUTINE W REFLEX MICROSCOPIC  HEMOGLOBIN A1C  SEDIMENTATION RATE  C-REACTIVE PROTEIN  PREALBUMIN  CBC  BASIC METABOLIC PANEL    EKG EKG Interpretation  Date/Time:  Friday March 10 2021 17:42:37 EDT Ventricular Rate:  71 PR Interval:  174 QRS Duration: 96 QT Interval:  394 QTC Calculation: 429 R Axis:   98 Text Interpretation: Sinus rhythm Borderline right axis deviation No significant change since last tracing Confirmed by Blanchie Dessert [phone]) on 03/10/2021 7:08:32 PM  Radiology DG Chest Port 1 View  Result Date: 03/10/2021 CLINICAL DATA:  Possible sepsis.  Smoker. EXAM: PORTABLE CHEST 1 VIEW COMPARISON:  07/05/2020 FINDINGS: Normal sized heart. Clear lungs. Old, healed right humeral neck fracture. IMPRESSION: No acute abnormality. Electronically  Signed   By: Claudie Revering M.D.   On: 03/10/2021 17:40   DG Foot Complete Left  Result Date: 03/10/2021 CLINICAL DATA:  Plantar left foot pain and possible sepsis. EXAM: LEFT FOOT - COMPLETE 3+ VIEW COMPARISON:  04/01/2020 FINDINGS: Soft tissue gas and swelling adjacent to distal 1st metatarsal and 2nd toe. On the frontal view, there is a suggestion of subluxation or dislocation at the 2nd MTP joint, with possible corresponding dorsal subluxation of the 2nd toe at the MTP joint on the lateral view, difficult to assess due to overlapping of the toes. No bone destruction or periosteal reaction. IMPRESSION: 1. Soft tissue gas and swelling in the medial aspect of the distal foot, suspicious for infection with a gas-forming organism. 2. Suggestion of subluxation/dislocation of the 2nd MTP joint. 3. No bony changes of osteomyelitis. Electronically Signed   By: Claudie Revering M.D.   On: 03/10/2021 17:52    Procedures Procedures   Medications Ordered in ED Medications  lactated ringers infusion (0 mLs Intravenous Paused 03/10/21 1815)  cefTRIAXone (ROCEPHIN) 2 g in sodium chloride 0.9 % 100 mL IVPB (has no administration in time range)  metroNIDAZOLE (FLAGYL) tablet 500 mg (has no administration in time range)  acetaminophen (TYLENOL) tablet 650 mg (has no administration in time range)    Or  acetaminophen (TYLENOL) suppository 650 mg (has no administration in time range)  ondansetron (ZOFRAN) tablet 4 mg (has no administration in time range)    Or  ondansetron (ZOFRAN) injection 4 mg (has no administration in time range)  sodium chloride 0.9 % bolus 1,000 mL (1,000 mLs Intravenous New Bag/Given 03/10/21 1817)    ED Course  I have reviewed the triage vital signs and the nursing notes.  Pertinent labs & imaging results that were available during my care of the patient were reviewed by me and considered in my medical decision making (see chart for details).    MDM Rules/Calculators/A&P                           60 y.o. F with PMH/o DM who presents for evaluation of left foot pain, redness, swelling.  She has been followed by podiatry for this wound but was sent over today when she is out of the office and wound appeared acutely worse.  Concern for infectious process.  On initial arrival, she has a low-grade temp of 99.1.  She has soft blood pressures.  Vitals otherwise stable.  On exam, her left foot is swollen, erythematous with evidence of wound noted to the plantar surface as well as swelling to the second toe.  Concern for infectious process versus osteomyelitis.  Plan to check labs, imaging.  Reviewed Dr. Serita Grit (Podiatry) note: He recommends admission with IV antibiotics and possible surgical intervention. He also recommends obtaining and MRI and ABIs, wet to dry dressing changes, and partial weightbearing to the heel.   CBC shows leukocytosis of 12.5. Hgb is 10.6. CXR  unremarkable. CMP shows sodium 133, calcium of 3.3.  BUN is 26, creatinine 1.15.  Foot x-ray shows soft tissue gas and swelling in medial aspect of distal foot suspicious for infection with gas-forming organism.  There is suggestion of subluxation/dislocation of second MTP joint.  I discussed with Dr. Posey Pronto (podiatry).  He is aware of patient's likely admission to the hospital.  He will plan to consult on patient tomorrow and probable OR.  He recommends keeping patient n.p.o. at midnight. He also requests that an MRI be obtained. MRI has been ordered.   Updated patient on plan. She is agreeable.   Discussed patient with Dr. Hurley [facility]) who accepts patient for admission.   Portions of this note were generated with [employer]. Dictation errors may occur despite best attempts at proofreading.   Final Clinical Impression(s) / ED Diagnoses Final diagnoses:  Left foot infection    Rx / DC Orders ED Discharge Orders     None        Volanda Napoleon, PA-C 03/10/21 1932    Blanchie Dessert,  MD 03/10/21 2023

## 2021-03-10 NOTE — Progress Notes (Signed)
Subjective:  Patient ID: [name], female    DOB: 12-Apr-1961,  MRN: [id number]  Chief Complaint  Patient presents with   Foot Ulcer    Left foot ulcer     60 y.o. female presents with the above complaint.  Patient presents with left submetatarsal 2 ulceration that probes down to bone.  She states that there is redness associated.  She states is gotten worse over the last few weeks.  Patient is here with his son.  She states that this has progressed gotten worse he started with a small ulceration and got more swollen.  She was recently sent over to the hospital however she left AMA.  She has not seen me in the interim.  She denies any other acute complaints.  She does smoke cigarettes.  No nausea fever chills vomiting.   Review of Systems: Negative except as noted in the HPI. Denies N/V/F/Ch.  Past Medical History:  Diagnosis Date   Diabetes mellitus without complication ([place])    Hidradenitis 12/13/2015   Hypercholesterolemia    Hypertension    Smoker 12/13/2015    Current Outpatient Medications:    ACCU-CHEK GUIDE test strip, USE TO TEST 3 TIMESDDAILY., Disp: , Rfl:    Accu-Chek Softclix Lancets lancets, 3 (three) times daily., Disp: , Rfl:    atorvastatin (LIPITOR) 40 MG tablet, Take 1 tablet by mouth daily., Disp: , Rfl:    blood glucose meter kit and supplies, Dispense based on patient and insurance preference. Use up to four times daily as directed. (FOR ICD-10 E10.9, E11.9)., Disp: 1 each, Rfl: 0   diphenhydrAMINE HCl (BENADRYL PO), Take by mouth., Disp: , Rfl:    doxycycline (VIBRA-TABS) 100 MG tablet, Take 1 tablet (100 mg total) by mouth 2 (two) times daily., Disp: 24 tablet, Rfl: 0   gabapentin (NEURONTIN) 100 MG capsule, Take 1 capsule (100 mg total) by mouth 3 (three) times daily., Disp: 90 capsule, Rfl: 3   glipiZIDE (GLUCOTROL) 5 MG tablet, Take 5 mg by mouth 2 (two) times daily., Disp: , Rfl:    insulin glargine (LANTUS) 100 UNIT/ML Solostar Pen, Inject 10 Units  into the skin at bedtime., Disp: 15 mL, Rfl: 1   insulin lispro (HUMALOG) 100 UNIT/ML KwikPen, Inject 0.03 mLs (3 Units total) into the skin with breakfast, with lunch, and with evening meal., Disp: 15 mL, Rfl: 1   Insulin Pen Needle 31G X 5 MM MISC, 1 Device by Does not apply route as directed., Disp: 100 each, Rfl: 1   lisinopril-hydrochlorothiazide (PRINZIDE,ZESTORETIC) 10-12.5 MG tablet, Take 1 tablet by mouth daily., Disp: , Rfl:    metFORMIN (GLUCOPHAGE) 850 MG tablet, Take 850 mg by mouth 2 (two) times daily., Disp: , Rfl:    oxybutynin (DITROPAN-XL) 10 MG 24 hr tablet, Take 1 tablet (10 mg total) by mouth at bedtime., Disp: 30 tablet, Rfl: 3   PROAIR HFA 108 (90 Base) MCG/ACT inhaler, SMARTSIG:2 Puff(s) Via Inhaler 4 Times Daily PRN, Disp: , Rfl:   Social History   Tobacco Use  Smoking Status Every Day   Packs/day: 0.50   Years: 34.00   Pack years: 17.00   Types: Cigarettes  Smokeless Tobacco Never    No Known Allergies Objective:  There were no vitals filed for this visit. There is no height or weight on file to calculate BMI. Constitutional Well developed. Well nourished.  Vascular Dorsalis pedis pulses palpable bilaterally. Posterior tibial pulses non palpable bilaterally. Capillary refill normal to all digits.  No cyanosis  or clubbing noted. Pedal hair growth normal.  Neurologic Normal speech. Oriented to person, place, and time. Epicritic sensation to light touch grossly present bilaterally.  Dermatologic Left submetatarsal 2 ulceration with probing down to bone.  Cellulitis noted up to the mid leg.  Concerning for possible abscess around the second digit.  Concern for osteomyelitis of the second metatarsal head as well as the second digit.  No purulent drainage noted no malodor present.  Orthopedic: Normal joint ROM without pain or crepitus bilaterally. No visible deformities. No bony tenderness.       Radiographs: None Assessment:   1. Ulcer of left foot  with fat layer exposed ([place])   2. Type 2 diabetes mellitus with diabetic polyneuropathy, with long-term current use of insulin (HCC)   3. Cellulitis of foot    Plan:  Patient was evaluated and treated and all questions answered.  Left submetatarsal 2 ulceration probing down to bone with cellulitis -I explained patient the etiology of ulceration retreatment options were discussed.  Clinically the wound has regressed considerably.  At this time given that there is redness present with concern for osteomyelitis patient will benefit from hospital admission for IV antibiotics broad-spectrum and a possible surgical intervention. -She was sent immediately to the emergency room to be admitted for IV antibiotics and x-ray -She will also need an MRI of the left foot with and without contrast to assess for abscess as well as osteomyelitis -ABIs PVRs to assess the vascular flow -Betadine wet-to-dry dressing changes -Partial weightbearing to the heel -Given the nature of the infection as well as the status of being diabetes she is a high risk of losing the digit as well as the foot possibly the leg.  I discussed this with the patient and her son in extensive detail they both state understanding.  No follow-ups on file.

## 2021-03-10 NOTE — H&P (Addendum)
History and Physical    [name] GGY:[id number] DOB: [birth year] DOA: 03/10/2021  PCP: Rosita Fire, MD  Patient coming from: Home  I have personally briefly reviewed patient's old medical records in [place]  Chief Complaint: Diabetic foot ulcer / infection  HPI: [name] is a 60 y.o. female with medical history significant of DM2, HTN, diabetic peripheral neuropathy.  Pt with ongoing L foot ulcer.  Followed by Dr. Posey Pronto (podiatry) for management.  Despite outpt management foot has gotten worse over the past few days.  Saw Dr. Posey Pronto today, recd to come to ED for admission, IV ABx and surgery planned for tomorrow.  Pt reports taking tylenol at home due to feeling hot but hasnt measured a fever.  R foot increasingly swollen, red.  No CP, no SOB, no abd pain.   ED Course: Started on ABx.  Hospitalist asked to admit.   Review of Systems: As per HPI, otherwise all review of systems negative.  Past Medical History:  Diagnosis Date   Diabetes mellitus without complication ([place])    Hidradenitis 12/13/2015   Hypercholesterolemia    Hypertension    Smoker 12/13/2015    Past Surgical History:  Procedure Laterality Date   FLEXIBLE SIGMOIDOSCOPY N/A 09/30/2017   Procedure: FLEXIBLE SIGMOIDOSCOPY;  Surgeon: Danie Binder, MD;  Location: [facility];  Service: Endoscopy;  Laterality: N/A;     reports that she has been smoking cigarettes. She has a 17.00 pack-year smoking history. She has never used smokeless tobacco. She reports that she does not drink alcohol and does not use drugs.  No Known Allergies  Family History  Problem Relation Age of Onset   Asthma Mother    Cancer Mother    Hypertension Mother    Diabetes Mother    Hypertension Father    Diabetes Father    Other Brother        hit by car   Breast cancer Sister    Stroke Brother    Hypertension Maternal Grandmother    Diabetes Maternal Grandmother    Hyperlipidemia Maternal  Grandmother    Hypertension Maternal Grandfather    Hyperlipidemia Maternal Grandfather    Hypertension Paternal Grandmother    Hyperlipidemia Paternal Grandmother    Hypertension Paternal Grandfather    Hyperlipidemia Paternal Grandfather      Prior to Admission medications   Medication Sig Start Date End Date Taking? Authorizing Provider  ACCU-CHEK GUIDE test strip USE TO TEST 3 TIMESDDAILY. 03/30/20   [provider]  Accu-Chek Softclix Lancets lancets 3 (three) times daily. 03/30/20   [provider]  atorvastatin (LIPITOR) 40 MG tablet Take 1 tablet by mouth daily. 10/05/20   [provider]  blood glucose meter kit and supplies Dispense based on patient and insurance preference. Use up to four times daily as directed. (FOR ICD-10 E10.9, E11.9). 03/28/20   Johnson, Clanford L, MD  diphenhydrAMINE HCl (BENADRYL PO) Take by mouth.    [provider]  doxycycline (VIBRA-TABS) 100 MG tablet Take 1 tablet (100 mg total) by mouth 2 (two) times daily. 04/22/20   Felipa Furnace, DPM  gabapentin (NEURONTIN) 100 MG capsule Take 1 capsule (100 mg total) by mouth 3 (three) times daily. 04/22/20   Felipa Furnace, DPM  glipiZIDE (GLUCOTROL) 5 MG tablet Take 5 mg by mouth 2 (two) times daily. 12/06/20   [provider]  insulin glargine (LANTUS) 100 UNIT/ML Solostar Pen Inject 10 Units into the skin at bedtime.  03/28/20   Johnson, Clanford L, MD  insulin lispro (HUMALOG) 100 UNIT/ML KwikPen Inject 0.03 mLs (3 Units total) into the skin with breakfast, with lunch, and with evening meal. 03/28/20   Johnson, Clanford L, MD  Insulin Pen Needle 31G X 5 MM MISC 1 Device by Does not apply route as directed. 03/28/20   Johnson, Clanford L, MD  lisinopril-hydrochlorothiazide (PRINZIDE,ZESTORETIC) 10-12.5 MG tablet Take 1 tablet by mouth daily.    [provider]  metFORMIN (GLUCOPHAGE) 850 MG tablet Take 850 mg by mouth 2 (two) times daily. 01/25/21   [provider]  oxybutynin (DITROPAN-XL) 10 MG 24 hr tablet Take 1 tablet (10 mg total) by mouth at bedtime. 02/02/21   Estill Dooms, NP  PROAIR HFA 108 [phone] Base) MCG/ACT inhaler SMARTSIG:2 Puff(s) Via Inhaler 4 Times Daily PRN 12/06/20   [provider]    Physical Exam: Vitals:   03/10/21 1745 03/10/21 1815 03/10/21 1830 03/10/21 1900  BP: (!) 107/55 (!) 97/57 (!) 101/57 121/62  Pulse: 67 66 67 81  Resp: _0 (!) 21  Temp:      TempSrc:      SpO2: 98% 96% 99% 99%  Weight:      Height:        Constitutional: NAD, calm, comfortable Eyes: PERRL, lids and conjunctivae normal ENMT: Mucous membranes are moist. Posterior pharynx clear of any exudate or lesions.Normal dentition.  Neck: normal, supple, no masses, no thyromegaly Respiratory: clear to auscultation bilaterally, no wheezing, no crackles. Normal respiratory effort. No accessory muscle use.  Cardiovascular: Regular rate and rhythm, no murmurs / rubs / gallops. No extremity edema. 2+ pedal pulses. No carotid bruits.  Abdomen: no tenderness, no masses palpated. No hepatosplenomegaly. Bowel sounds positive.  Musculoskeletal: no clubbing / cyanosis. No joint deformity upper and lower extremities. Good ROM, no contractures. Normal muscle tone.  Skin:     Neurologic: CN 2-12 grossly intact. Sensation intact, DTR normal. Strength 5/5 in all 4.  Psychiatric: Normal judgment and insight. Alert and oriented x 3. Normal mood.    Labs on Admission: I have personally reviewed following labs and imaging studies  CBC: Recent Labs  Lab 03/10/21 1555  WBC 12.5*  NEUTROABS 9.5*  HGB 10.6*  HCT 31.9*  MCV 92.7  PLT 503   Basic Metabolic Panel: Recent Labs  Lab 03/10/21 1555  NA 133*  K 3.3*  CL 95*  CO2 26  GLUCOSE 109*  BUN 26*  CREATININE 1.15*  CALCIUM 9.0   GFR: Estimated Creatinine Clearance: 52 mL/min (A) (by C-G formula based on SCr of 1.15 mg/dL (H)). Liver Function Tests: Recent Labs  Lab  03/10/21 1555  AST 25  ALT 27  ALKPHOS 113  BILITOT 0.6  PROT 6.8  ALBUMIN 2.9*   No results for input(s): LIPASE, AMYLASE in the last 168 hours. No results for input(s): AMMONIA in the last 168 hours. Coagulation Profile: Recent Labs  Lab 03/10/21 1555  INR 1.1   Cardiac Enzymes: No results for input(s): CKTOTAL, CKMB, CKMBINDEX, TROPONINI in the last 168 hours. BNP (last 3 results) No results for input(s): PROBNP in the last 8760 hours. HbA1C: No results for input(s): HGBA1C in the last 72 hours. CBG: No results for input(s): GLUCAP in the last 168 hours. Lipid Profile: No results for input(s): CHOL, HDL, LDLCALC, TRIG, CHOLHDL, LDLDIRECT in the last 72 hours. Thyroid Function Tests: No results for input(s): TSH, T4TOTAL, FREET4, T3FREE, THYROIDAB in the last 72 hours. Anemia  Panel: No results for input(s): VITAMINB12, FOLATE, FERRITIN, TIBC, IRON, RETICCTPCT in the last 72 hours. Urine analysis:    Component Value Date/Time   COLORURINE YELLOW 11/14/2017 0355   APPEARANCEUR CLEAR 11/14/2017 0355   LABSPEC 1.008 11/14/2017 0355   PHURINE 6.0 11/14/2017 0355   GLUCOSEU 50 (A) 11/14/2017 0355   HGBUR NEGATIVE 11/14/2017 0355   BILIRUBINUR NEGATIVE 11/14/2017 0355   KETONESUR NEGATIVE 11/14/2017 0355   PROTEINUR NEGATIVE 11/14/2017 0355   UROBILINOGEN 0.2 06/23/2013 1750   NITRITE NEGATIVE 11/14/2017 0355   LEUKOCYTESUR MODERATE (A) 11/14/2017 0355    Radiological Exams on Admission: DG Chest Port 1 View  Result Date: 03/10/2021 CLINICAL DATA:  Possible sepsis.  Smoker. EXAM: PORTABLE CHEST 1 VIEW COMPARISON:  07/05/2020 FINDINGS: Normal sized heart. Clear lungs. Old, healed right humeral neck fracture. IMPRESSION: No acute abnormality. Electronically Signed   By: Claudie Revering M.D.   On: 03/10/2021 17:40   DG Foot Complete Left  Result Date: 03/10/2021 CLINICAL DATA:  Plantar left foot pain and possible sepsis. EXAM: LEFT FOOT - COMPLETE 3+ VIEW COMPARISON:   04/01/2020 FINDINGS: Soft tissue gas and swelling adjacent to distal 1st metatarsal and 2nd toe. On the frontal view, there is a suggestion of subluxation or dislocation at the 2nd MTP joint, with possible corresponding dorsal subluxation of the 2nd toe at the MTP joint on the lateral view, difficult to assess due to overlapping of the toes. No bone destruction or periosteal reaction. IMPRESSION: 1. Soft tissue gas and swelling in the medial aspect of the distal foot, suspicious for infection with a gas-forming organism. 2. Suggestion of subluxation/dislocation of the 2nd MTP joint. 3. No bony changes of osteomyelitis. Electronically Signed   By: Claudie Revering M.D.   On: 03/10/2021 17:52    EKG: Independently reviewed.  Assessment/Plan Principal Problem:   Diabetic infection of left foot ([place]) Active Problems:   Osteomyelitis (HCC)   Cellulitis of left lower extremity   Polyneuropathy in diabetes ([place])   DM2 (diabetes mellitus, type 2) (HCC)   Diabetic ulcer of left foot (HCC)    Infected diabetic foot ulcer of L foot - ? Osteomyelitis (Dr. Posey Pronto reports being able to probe to bone). MRI ordered at Dr. Serita Grit request Though typically, the ability to probe to bone (as he reports he is able to do in his note) is usually considered diagnostic of osteomyelitis just by itself. LE wound pathway ABx: rocephin + flagyl + vanc NPO after MN Dr. Posey Pronto plans on seeing in consult tomorrow and possibly taking to OR. ABIs ordered Wound care consult ordered DM2 - Med rec pending SSI Q4H for the moment HTN - Med rec pending, plan to continue home meds  DVT prophylaxis: SCDs Code Status: Full Family Communication: no family in room Disposition Plan: Home after treatment for diabetic foot infection Consults called: Dr. Posey Pronto ([facility]) Admission status: Admit to inpatient  Severity of Illness: The appropriate patient status for this patient is INPATIENT. Inpatient status is judged to be reasonable and  necessary in order to provide the required intensity of service to ensure the patient's safety. The patient's presenting symptoms, physical exam findings, and initial radiographic and laboratory data in the context of their chronic comorbidities is felt to place them at high risk for further clinical deterioration. Furthermore, it is not anticipated that the patient will be medically stable for discharge from the hospital within 2 midnights of admission. The following factors support the patient status of inpatient.   IP status  for treatment of limb threatening infection.  Failed outpt therapy.  Likely to need surgical management.  * I certify that at the point of admission it is my clinical judgment that the patient will require inpatient hospital care spanning beyond 2 midnights from the point of admission due to high intensity of service, high risk for further deterioration and high frequency of surveillance required.*   Micholas Drumwright M. DO [facility]  How to contact the [facility] Attending or Consulting provider [place] or covering provider during after hours [place], for this patient?  Check the care team in [facility] and look for a) attending/consulting [facility] provider listed and b) the [facility] team listed Log into [email or web address]  Amion Physician Scheduling and messaging for groups and whole hospitals  On call and physician scheduling software for group practices, residents, hospitalists and other medical providers for call, clinic, rotation and shift schedules. On[employer] is a hospital-wide system for scheduling doctors and paging doctors on call. EasyPlot is for scientific plotting and data analysis.  [email or web address]  and use [place]'s universal password to access. If you do not have the password, please contact the hospital operator.  Locate the [facility] provider you are looking for under [facility] and page to a number that you can be directly reached. If you still have difficulty  reaching the provider, please page the [facility] (Director on Call) for the Hospitalists listed on amion for assistance.  03/10/2021, 7:24 PM

## 2021-03-10 NOTE — ED Notes (Signed)
Pt cooperative and able to follow commands, but appears to be responding to external stimuli, short attention span, and disoriented to event/situation, A&Ox3, GCS 14.

## 2021-03-11 ENCOUNTER — Inpatient Hospital Stay (HOSPITAL_COMMUNITY): Payer: Medicaid Other

## 2021-03-11 DIAGNOSIS — I96 Gangrene, not elsewhere classified: Secondary | ICD-10-CM

## 2021-03-11 DIAGNOSIS — L97422 Non-pressure chronic ulcer of left heel and midfoot with fat layer exposed: Secondary | ICD-10-CM

## 2021-03-11 DIAGNOSIS — L03116 Cellulitis of left lower limb: Secondary | ICD-10-CM

## 2021-03-11 DIAGNOSIS — E11621 Type 2 diabetes mellitus with foot ulcer: Secondary | ICD-10-CM

## 2021-03-11 DIAGNOSIS — L0390 Cellulitis, unspecified: Secondary | ICD-10-CM

## 2021-03-11 DIAGNOSIS — E1142 Type 2 diabetes mellitus with diabetic polyneuropathy: Secondary | ICD-10-CM

## 2021-03-11 DIAGNOSIS — L97522 Non-pressure chronic ulcer of other part of left foot with fat layer exposed: Secondary | ICD-10-CM

## 2021-03-11 DIAGNOSIS — L97509 Non-pressure chronic ulcer of other part of unspecified foot with unspecified severity: Secondary | ICD-10-CM

## 2021-03-11 DIAGNOSIS — L089 Local infection of the skin and subcutaneous tissue, unspecified: Secondary | ICD-10-CM

## 2021-03-11 DIAGNOSIS — E11628 Type 2 diabetes mellitus with other skin complications: Secondary | ICD-10-CM

## 2021-03-11 DIAGNOSIS — Z794 Long term (current) use of insulin: Secondary | ICD-10-CM

## 2021-03-11 LAB — CBC
HCT: 30.4 % — ABNORMAL LOW (ref 36.0–46.0)
Hemoglobin: 10.2 g/dL — ABNORMAL LOW (ref 12.0–15.0)
MCH: 30.8 pg (ref 26.0–34.0)
MCHC: 33.6 g/dL (ref 30.0–36.0)
MCV: 91.8 fL (ref 80.0–100.0)
Platelets: 319 10*3/uL (ref 150–400)
RBC: 3.31 MIL/uL — ABNORMAL LOW (ref 3.87–5.11)
RDW: 11.7 % (ref 11.5–15.5)
WBC: 9 10*3/uL (ref 4.0–10.5)
nRBC: 0 % (ref 0.0–0.2)

## 2021-03-11 LAB — BASIC METABOLIC PANEL
Anion gap: 14 (ref 5–15)
BUN: 20 mg/dL (ref 6–20)
CO2: 23 mmol/L (ref 22–32)
Calcium: 8.8 mg/dL — ABNORMAL LOW (ref 8.9–10.3)
Chloride: 100 mmol/L (ref 98–111)
Creatinine, Ser: 0.95 mg/dL (ref 0.44–1.00)
GFR, Estimated: >60 mL/min (ref >60)
Glucose, Bld: 141 mg/dL — ABNORMAL HIGH (ref 70–99)
Potassium: 3.4 mmol/L — ABNORMAL LOW (ref 3.5–5.1)
Sodium: 137 mmol/L (ref 135–145)

## 2021-03-11 LAB — GLUCOSE, CAPILLARY
Glucose-Capillary: 118 mg/dL — ABNORMAL HIGH (ref 70–99)
Glucose-Capillary: 99 mg/dL (ref 70–99)
Glucose-Capillary: 177 mg/dL — ABNORMAL HIGH (ref 70–99)
Glucose-Capillary: 217 mg/dL — ABNORMAL HIGH (ref 70–99)
Glucose-Capillary: 195 mg/dL — ABNORMAL HIGH (ref 70–99)
Glucose-Capillary: 170 mg/dL — ABNORMAL HIGH (ref 70–99)

## 2021-03-11 LAB — SURGICAL PCR SCREEN
MRSA, PCR: NEGATIVE
Staphylococcus aureus: NEGATIVE

## 2021-03-11 MED ORDER — HEPARIN SODIUM (PORCINE) 5000 UNIT/ML IJ SOLN
5000.0000 [IU] | Freq: Three times a day (TID) | INTRAMUSCULAR | Status: DC
  Administered 2021-03-11 (×2): 5000 [IU] via SUBCUTANEOUS
  Filled 2021-03-11 (×2): qty 1

## 2021-03-11 MED ORDER — ASCORBIC ACID 500 MG PO TABS
500.0000 mg | ORAL_TABLET | Freq: Every day | ORAL | Status: DC
  Administered 2021-03-11 – 2021-03-12 (×2): 500 mg via ORAL
  Filled 2021-03-11 (×2): qty 1

## 2021-03-11 MED ORDER — POTASSIUM CHLORIDE CRYS ER 20 MEQ PO TBCR
20.0000 meq | EXTENDED_RELEASE_TABLET | Freq: Once | ORAL | Status: AC
  Administered 2021-03-11: 20 meq via ORAL
  Filled 2021-03-11: qty 1

## 2021-03-11 MED ORDER — ADULT MULTIVITAMIN W/MINERALS CH
1.0000 | ORAL_TABLET | Freq: Every day | ORAL | Status: DC
  Administered 2021-03-11 – 2021-03-12 (×2): 1 via ORAL
  Filled 2021-03-11 (×2): qty 1

## 2021-03-11 MED ORDER — JUVEN PO PACK
1.0000 | PACK | Freq: Two times a day (BID) | ORAL | Status: DC
  Administered 2021-03-11 – 2021-03-12 (×3): 1 via ORAL
  Filled 2021-03-11 (×3): qty 1

## 2021-03-11 NOTE — Plan of Care (Signed)
  Problem: Education: Goal: Knowledge of General Education information will improve Description: Including pain rating scale, medication(s)/side effects and non-pharmacologic comfort measures Outcome: Progressing   Problem: Health Behavior/Discharge Planning: Goal: Ability to manage health-related needs will improve Outcome: Progressing   Problem: Clinical Measurements: Goal: Ability to maintain clinical measurements within normal limits will improve Outcome: Progressing Goal: Will remain free from infection Outcome: Progressing   Problem: Activity: Goal: Risk for activity intolerance will decrease Outcome: Progressing   Problem: Elimination: Goal: Will not experience complications related to urinary retention Outcome: Progressing   Problem: Pain Managment: Goal: General experience of comfort will improve Outcome: Progressing   Problem: Safety: Goal: Ability to remain free from injury will improve Outcome: Progressing   Problem: Skin Integrity: Goal: Risk for impaired skin integrity will decrease Outcome: Progressing   

## 2021-03-11 NOTE — Progress Notes (Signed)
PROGRESS NOTE  [name]  MRN:[id number] DOB: 05/17/1961 DOA: 03/10/2021 PCP: Fanta, Tesfaye, MD  Outpatient Specialists: Kevin Patel, DPM Brief Narrative: [name] is a 59 y.o. female with a history of left foot ulcer, IDT2DM with polyneuropathy, HLD, HTN, and tobacco use who was referred to the ED 6/10 by podiatry due to left submetatarsal ulceration that probes to bone worsening despite outpatient management. WBC and inflammatory markers elevated. Pt did not cooperate with MRI. Podiatry inpatient consultation pending, as are ABIs.   Assessment & Plan: Principal Problem:   Diabetic infection of left foot (HCC) Active Problems:   Osteomyelitis (HCC)   Cellulitis of left lower extremity   Polyneuropathy in diabetes (HCC)   DM2 (diabetes mellitus, type 2) (HCC)   Diabetic ulcer of left foot (HCC)  Diabetic left foot ulcer: Failing outpatient management. WBC 12.5k, CRP 16.1, ESR 82.  - ABIs pending - Continue vancomycin, ceftriaxone, flagyl pending blood culture results and/or operative culture.  - MRI not tolerated last night, hopeful to retry today.  Suspected malnutrition: Prealbumin is 7.0.  - Dietitian consult.   IDT2DM: Poorly controlled, with polyneuropathy. Last HbA1c was 9.9%.  - Continue SSI, augment regimen as indicated.  HTN:  - Normotensive off medications.   HLD:  - Restart statin once confirmed by med rec.  Hypokalemia:  - supplement and monitor.   Tobacco use:  - Nicotine patch  - Cessation counseling  Pyuria: On very contaminated specimen. No symptoms. No Tx planned.   RN Pressure Injury Documentation: Pressure Injury 03/10/21 Buttocks Right;Medial Stage 2 -  Partial thickness loss of dermis presenting as a shallow open injury with a red, pink wound bed without slough. (Active)  03/10/21 2200  Location: Buttocks  Location Orientation: Right;Medial  Staging: Stage 2 -  Partial thickness loss of dermis presenting as a shallow open injury  with a red, pink wound bed without slough.  Wound Description (Comments):   Present on Admission: Yes   DVT prophylaxis: Heparin Code Status: Full Family Communication: None at bedside Disposition Plan:  Status is: Inpatient  Remains inpatient appropriate because: Operative management of wound that has failed outpatient management at high risk of loss of limb.  Dispo: The patient is from: Home              Anticipated d/c is to: Home              Patient currently is not medically stable to d/c.   Difficult to place patient No  Consultants:  Podiatry, Dr. Patel  Procedures:  TBD  Antimicrobials: Vancomycin, CTX, flagyl 6/10 >>   Subjective: Pt with minimal pain this morning. She was not cooperative with MRI last night and is requesting ice this morning.   Objective: Vitals:   03/10/21 1830 03/10/21 1900 03/10/21 2059 03/11/21 0339  BP: (!) 101/57 121/62 116/64 112/62  Pulse: 67 81 87 88  Resp: 18 (!) 21 18 17  Temp:   98 F (36.7 C) 98.9 F (37.2 C)  TempSrc:    Oral  SpO2: 99% 99% 98% 96%  Weight:      Height:        Intake/Output Summary (Last 24 hours) at 03/11/2021 0843 Last data filed at 03/11/2021 0509 Gross per 24 hour  Intake 1693.7 ml  Output --  Net 1693.7 ml   Filed Weights   03/10/21 1550  Weight: 69.9 kg    Gen: 59 y.o. female in no distress Pulm: Non-labored breathing room air. Clear   to auscultation bilaterally.  CV: Regular rate and rhythm. No murmur, rub, or gallop. No JVD, no pitting LE edema. GI: Abdomen soft, non-tender, non-distended, with normoactive bowel sounds. No organomegaly or masses felt. Ext: Warm, no deformities Skin: Left ankle/foot wrapped extensively with decreased sensation bilateral feet, motor function intact, cap refill adequate. Dressing is c/d/I. Wound pictured <24 hours ago in PN. Neuro: Alert and oriented. No focal neurological deficits. Psych: Judgement and insight appear normal. Mood & affect appropriate.   Data  Reviewed: I have personally reviewed following labs and imaging studies  CBC: Recent Labs  Lab 03/10/21 1555 03/11/21 0019  WBC 12.5* 9.0  NEUTROABS 9.5*  --   HGB 10.6* 10.2*  HCT 31.9* 30.4*  MCV 92.7 91.8  PLT 372 341   Basic Metabolic Panel: Recent Labs  Lab 03/10/21 1555 03/11/21 0019  NA 133* 137  K 3.3* 3.4*  CL 95* 100  CO2 26 23  GLUCOSE 109* 141*  BUN 26* 20  CREATININE 1.15* 0.95  CALCIUM 9.0 8.8*   GFR: Estimated Creatinine Clearance: 62.9 mL/min (by C-G formula based on SCr of 0.95 mg/dL). Liver Function Tests: Recent Labs  Lab 03/10/21 1555  AST 25  ALT 27  ALKPHOS 113  BILITOT 0.6  PROT 6.8  ALBUMIN 2.9*   No results for input(s): LIPASE, AMYLASE in the last 168 hours. No results for input(s): AMMONIA in the last 168 hours. Coagulation Profile: Recent Labs  Lab 03/10/21 1555  INR 1.1   Cardiac Enzymes: No results for input(s): CKTOTAL, CKMB, CKMBINDEX, TROPONINI in the last 168 hours. BNP (last 3 results) No results for input(s): PROBNP in the last 8760 hours. HbA1C: No results for input(s): HGBA1C in the last 72 hours. CBG: Recent Labs  Lab 03/10/21 2352 03/11/21 0337 03/11/21 0820  GLUCAP 179* 118* 99   Lipid Profile: No results for input(s): CHOL, HDL, LDLCALC, TRIG, CHOLHDL, LDLDIRECT in the last 72 hours. Thyroid Function Tests: No results for input(s): TSH, T4TOTAL, FREET4, T3FREE, THYROIDAB in the last 72 hours. Anemia Panel: No results for input(s): VITAMINB12, FOLATE, FERRITIN, TIBC, IRON, RETICCTPCT in the last 72 hours. Urine analysis:    Component Value Date/Time   COLORURINE AMBER (A) 03/10/2021 1919   APPEARANCEUR CLOUDY (A) 03/10/2021 1919   LABSPEC 1.014 03/10/2021 1919   PHURINE 5.0 03/10/2021 1919   GLUCOSEU NEGATIVE 03/10/2021 1919   HGBUR SMALL (A) 03/10/2021 1919   BILIRUBINUR NEGATIVE 03/10/2021 1919   KETONESUR NEGATIVE 03/10/2021 1919   PROTEINUR 30 (A) 03/10/2021 1919   UROBILINOGEN 0.2 06/23/2013  1750   NITRITE POSITIVE (A) 03/10/2021 1919   LEUKOCYTESUR LARGE (A) 03/10/2021 1919   Recent Results (from the past 240 hour(s))  Resp Panel by RT-PCR (Flu A&B, Covid) Nasopharyngeal Swab     Status: None   Collection Time: 03/10/21  5:30 PM   Specimen: Nasopharyngeal Swab; Nasopharyngeal(NP) swabs in vial transport medium  Result Value Ref Range Status   SARS Coronavirus 2 by RT PCR NEGATIVE NEGATIVE Final    Comment: (NOTE) SARS-CoV-2 target nucleic acids are NOT DETECTED.  The SARS-CoV-2 RNA is generally detectable in upper respiratory specimens during the acute phase of infection. The lowest concentration of SARS-CoV-2 viral copies this assay can detect is 138 copies/mL. A negative result does not preclude SARS-Cov-2 infection and should not be used as the sole basis for treatment or other patient management decisions. A negative result may occur with  improper specimen collection/handling, submission of specimen other than nasopharyngeal swab, presence of  viral mutation(s) within the areas targeted by this assay, and inadequate number of viral copies(<138 copies/mL). A negative result must be combined with clinical observations, patient history, and epidemiological information. The expected result is Negative.  Fact Sheet for Patients:  EntrepreneurPulse.com.au  Fact Sheet for Healthcare Providers:  IncredibleEmployment.be  This test is no t yet approved or cleared by the [place] FDA and  has been authorized for detection and/or diagnosis of SARS-CoV-2 by FDA under an Emergency Use Authorization (EUA). This EUA will remain  in effect (meaning this test can be used) for the duration of the COVID-19 declaration under Section 564(b)(1) of the Act, 21 U.S.C.section 360bbb-3(b)(1), unless the authorization is terminated  or revoked sooner.       Influenza A by PCR NEGATIVE NEGATIVE Final   Influenza B by PCR NEGATIVE NEGATIVE Final     Comment: (NOTE) The Xpert Xpress SARS-CoV-2/FLU/RSV plus assay is intended as an aid in the diagnosis of influenza from Nasopharyngeal swab specimens and should not be used as a sole basis for treatment. Nasal washings and aspirates are unacceptable for Xpert Xpress SARS-CoV-2/FLU/RSV testing.  Fact Sheet for Patients: EntrepreneurPulse.com.au  Fact Sheet for Healthcare Providers: IncredibleEmployment.be  This test is not yet approved or cleared by the [place] FDA and has been authorized for detection and/or diagnosis of SARS-CoV-2 by FDA under an Emergency Use Authorization (EUA). This EUA will remain in effect (meaning this test can be used) for the duration of the COVID-19 declaration under Section 564(b)(1) of the Act, 21 U.S.C. section 360bbb-3(b)(1), unless the authorization is terminated or revoked.  Performed at [facility], [place] [address], [place], [place] [address]       Radiology Studies: DG Chest Port 1 View  Result Date: 03/10/2021 CLINICAL DATA:  Possible sepsis.  Smoker. EXAM: PORTABLE CHEST 1 VIEW COMPARISON:  07/05/2020 FINDINGS: Normal sized heart. Clear lungs. Old, healed right humeral neck fracture. IMPRESSION: No acute abnormality. Electronically Signed   By: Claudie Revering M.D.   On: 03/10/2021 17:40   DG Foot Complete Left  Result Date: 03/10/2021 CLINICAL DATA:  Plantar left foot pain and possible sepsis. EXAM: LEFT FOOT - COMPLETE 3+ VIEW COMPARISON:  04/01/2020 FINDINGS: Soft tissue gas and swelling adjacent to distal 1st metatarsal and 2nd toe. On the frontal view, there is a suggestion of subluxation or dislocation at the 2nd MTP joint, with possible corresponding dorsal subluxation of the 2nd toe at the MTP joint on the lateral view, difficult to assess due to overlapping of the toes. No bone destruction or periosteal reaction. IMPRESSION: 1. Soft tissue gas and swelling in the medial aspect of the distal  foot, suspicious for infection with a gas-forming organism. 2. Suggestion of subluxation/dislocation of the 2nd MTP joint. 3. No bony changes of osteomyelitis. Electronically Signed   By: Claudie Revering M.D.   On: 03/10/2021 17:52    Scheduled Meds:  insulin aspart  0-9 Units Subcutaneous Q4H   metroNIDAZOLE  500 mg Oral Q8H   nicotine  14 mg Transdermal Daily   Continuous Infusions:  cefTRIAXone (ROCEPHIN)  IV 1 g (03/11/21 0810)   lactated ringers 150 mL/hr at 03/10/21 2107   vancomycin       LOS: 1 day   Time spent: 25 minutes.  Patrecia Pour, MD [facility] [email or web address] 03/11/2021, 8:43 AM

## 2021-03-11 NOTE — Progress Notes (Signed)
2340 Pt was picked up for Lt foot MRI but was not able to do it  because pt can't stay still and very confuse at the moment. 0040 Notified Dr Loney Loh.

## 2021-03-11 NOTE — Consult Note (Addendum)
Subjective:  Patient ID: [name], female    DOB: July 06, 1961,  MRN: [id number]  Chief Complaint  Patient presents with   Foot Pain    60 y.o. female presents medical history significant of DM2, HTN, diabetic peripheral neuropathy. Patient presents with left submetatarsal 2 ulceration that probes down to bone.  She states she is doing well.  She can discuss that she wants to go home.  I encouraged her that she needs to stand for me to help clear out the infection.  She is more amenable now.  She denies any nausea fever chills vomiting.  She denies any other acute complaints.  I will discussed the case with our son as well.   Review of Systems: Negative except as noted in the HPI. Denies N/V/F/Ch.  Past Medical History:  Diagnosis Date   Diabetes mellitus without complication (HCC)    Hidradenitis 12/13/2015   Hypercholesterolemia    Hypertension    Smoker 12/13/2015    Current Facility-Administered Medications:    acetaminophen (TYLENOL) tablet 650 mg, 650 mg, Oral, Q6H PRN, 650 mg at 03/11/21 0035 **OR** acetaminophen (TYLENOL) suppository 650 mg, 650 mg, Rectal, Q6H PRN, Julian Reil, Jared M, DO   ascorbic acid (VITAMIN C) tablet 500 mg, 500 mg, Oral, Daily, Hazeline Junker B, MD   cefTRIAXone (ROCEPHIN) 1 g in sodium chloride 0.9 % 100 mL IVPB, 1 g, Intravenous, Q24H, Julian Reil, Jared M, DO, Last Rate: 200 mL/hr at 03/11/21 0810, 1 g at 03/11/21 0810   heparin injection 5,000 Units, 5,000 Units, Subcutaneous, Q8H, Hazeline Junker B, MD   insulin aspart (novoLOG) injection 0-9 Units, 0-9 Units, Subcutaneous, Q4H, Julian Reil, Jared M, DO, 2 Units at 03/10/21 2354   lactated ringers infusion, , Intravenous, Continuous, Couture, Cortni S, PA-C, Last Rate: 150 mL/hr at 03/10/21 2107, New Bag at 03/10/21 2107   metroNIDAZOLE (FLAGYL) tablet 500 mg, 500 mg, Oral, Q8H, Julian Reil, Jared M, DO, 500 mg at 03/11/21 2694   multivitamin with minerals tablet 1 tablet, 1 tablet, Oral, Daily, Tyrone Nine, MD    nicotine (NICODERM CQ - dosed in mg/24 hours) patch 14 mg, 14 mg, Transdermal, Daily, John Giovanni, MD, 14 mg at 03/11/21 0901   nutrition supplement (JUVEN) (JUVEN) powder packet 1 packet, 1 packet, Oral, BID BM, Tyrone Nine, MD   ondansetron (ZOFRAN) tablet 4 mg, 4 mg, Oral, Q6H PRN **OR** ondansetron (ZOFRAN) injection 4 mg, 4 mg, Intravenous, Q6H PRN, Julian Reil, Jared M, DO   potassium chloride SA (KLOR-CON) CR tablet 20 mEq, 20 mEq, Oral, Once, Tyrone Nine, MD   vancomycin (VANCOREADY) IVPB 1000 mg/200 mL, 1,000 mg, Intravenous, Q24H, Mancheril, Candis Schatz, RPH  Social History   Tobacco Use  Smoking Status Every Day   Packs/day: 0.50   Years: 34.00   Pack years: 17.00   Types: Cigarettes  Smokeless Tobacco Never    No Known Allergies Objective:   Vitals:   03/10/21 2059 03/11/21 0339  BP: 116/64 112/62  Pulse: 87 88  Resp: 18 17  Temp: 98 F (36.7 C) 98.9 F (37.2 C)  SpO2: 98% 96%   Body mass index is 25.45 kg/m. Constitutional Well developed. Well nourished.  Vascular Dorsalis pedis pulses palpable bilaterally. Posterior tibial pulses non palpable bilaterally. Capillary refill normal to all digits.  No cyanosis or clubbing noted. Pedal hair growth normal.  Neurologic Normal speech. Oriented to person, place, and time. Epicritic sensation to light touch grossly present bilaterally.  Dermatologic Left submetatarsal 2 ulceration with probing  down to bone.  Cellulitis noted up to the mid leg.  Concerning for possible abscess around the second digit.  Concern for osteomyelitis of the second metatarsal head as well as the second digit.  No purulent drainage noted no malodor present.  Orthopedic: Normal joint ROM without pain or crepitus bilaterally. No visible deformities. No bony tenderness.       Radiographs:  1. Soft tissue gas and swelling in the medial aspect of the distal foot, suspicious for infection with a gas-forming organism.  However soft  tissue gas is correlating with the wound.  Does not appear to be spreading proximally. 2. Suggestion of subluxation/dislocation of the 2nd MTP joint. 3. No bony changes of osteomyelitis. Assessment:   1. Left foot infection    Plan:  Patient was evaluated and treated and all questions answered.  Left submetatarsal 2 ulceration probing down to bone with cellulitis -I explained patient the etiology of ulceration retreatment options were discussed.  Clinically the wound continues to progress. -She was not able to tolerate the MRI.  However clinically the wound does probe down to bone to the metatarsal head of the second therefore is a high indication that there is osteomyelitic changes present along with skin and soft tissue infection. -I have scheduled her for the operating room tomorrow for partial second ray amputation with incision and drainage washout debridement of an abscess -ABIs PVRs to assess the vascular flow and possible vascular consult pending ABIs.  However I still plan to take him to the operating room tomorrow given the nature of the infection. -Betadine wet-to-dry dressing changes -Partial weightbearing to the heel -Given the nature of the infection as well as the status of being diabetes she is a high risk of losing the digit as well as the foot possibly the leg.  I discussed this with the patient and her son in extensive detail they both state understanding.  No follow-ups on file.

## 2021-03-11 NOTE — Progress Notes (Signed)
Inpatient Diabetes Program Recommendations  AACE/ADA: New Consensus Statement on Inpatient Glycemic Control   Target Ranges:  Prepandial:   less than 140 mg/dL      Peak postprandial:   less than 180 mg/dL (1-2 hours)      Critically ill patients:  140 - 180 mg/dL   Results for AVABELLA, WAILES (MRN [id number]) as of 03/11/2021 09:04  Ref. Range 03/10/2021 23:52 03/11/2021 03:37 03/11/2021 08:20  Glucose-Capillary Latest Ref Range: 70 - 99 mg/dL 622 (H) 633 (H) 99    Review of Glycemic Control  Diabetes history: DM2 Outpatient Diabetes medications: Lantus 10 units QHS, Humalog 3 units TID with meals, Glipizide 5 mg BID, Metformin 850 mg BID Current orders for Inpatient glycemic control: Novolog 0-9 units Q4H  NOTE: Noted consult for Diabetes Coordinator. Diabetes Coordinator is not on campus over the weekend but available by pager from 8am to 5pm for questions or concerns. Chart reviewed. Admitted with foot ulcer and glucose has ranged from 99-179 mg/dl over the past 9 hours. Agree with orders.  Will continue to follow along while inpatient.  Thanks, Orlando Penner, RN, MSN, CDE Diabetes Coordinator Inpatient Diabetes Program [phone] (Team Pager from 8am to 5pm)

## 2021-03-11 NOTE — Progress Notes (Signed)
VASCULAR LAB    ABIs have been performed. See CV proc for preliminary results.   Ashlan Dignan, RVT 03/11/2021, 4:04 PM

## 2021-03-11 NOTE — Progress Notes (Signed)
Initial Nutrition Assessment  DOCUMENTATION CODES:   Not applicable  INTERVENTION:   Juven BID, each packet provides 80 calories, 8 grams of carbohydrate, 2.5  grams of protein (collagen), 7 grams of L-arginine and 7 grams of L-glutamine; supplement contains CaHMB, Vitamins C, E, B12 and Zinc to promote wound healing  Add MVI with Minerals  Follow-up and reinforce adherence to diabetic diet with regards to wound healing  Add Vitamin C 500 mg daily as pt is at risk for deficiency given poor wound healing with failed outpatient management, tobacco use   NUTRITION DIAGNOSIS:   Increased nutrient needs related to wound healing as evidenced by estimated needs.  GOAL:   Patient will meet greater than or equal to 90% of their needs  MONITOR:   PO intake, Supplement acceptance, Labs, Weight trends, Skin  REASON FOR ASSESSMENT:   Consult Wound healing  ASSESSMENT:   60 yo female admitted with infection of diabetic ulcer on left foot which has failed outpatient management.  PMH  includes DM with polyneuropathy, HLD, HTN, tobacco use, left food ulcer  Noted plan for MRI of letf foot today; concern for osteo Attempted to reach patient via telephone but unsuccessful  Pt currently on diabetic diet, no recorded po intake. Spoke with RN who indicates pt was NPO this AM and diet just advanced, no intake yet today  CRP elevated indicating inflammation which negatively impacts prealbumin level irregardless of nutritional status  Lab Results  Component Value Date   HGBA1C 9.9 (H) 07/05/2020   No recent HgbA1c  Current wt 69.9 kg; does not appear to be any recent wt loss per weight encounters  Labs: CRP 16 (H), potassium 3.4 (L), CBGs 99-179 Meds: ss novolog, KCl  NUTRITION - FOCUSED PHYSICAL EXAM:  Deferred, unable to assess  Diet Order:   Diet Order             Diet NPO time specified  Diet effective midnight           Diet Carb Modified Fluid consistency: Thin; Room  service appropriate? Yes  Diet effective now                   EDUCATION NEEDS:   Education needs have been addressed  Skin:  Skin Assessment: Skin Integrity Issues: Skin Integrity Issues:: Stage II, Diabetic Ulcer, Other (Comment) Stage II: buttocks Diabetic Ulcer: Left foot Other: cellulitis LLE  Last BM:  PTA  Height:   Ht Readings from Last 1 Encounters:  03/10/21 5' 5.25" (1.657 m)    Weight:   Wt Readings from Last 1 Encounters:  03/10/21 69.9 kg    BMI:  Body mass index is 25.45 kg/m.  Estimated Nutritional Needs:   Kcal:  1800-2100 kcals  Protein:  100-120 g  Fluid:  >/= 1.8 L    Romelle Starcher MS, RDN, LDN, CNSC Registered Dietitian III Clinical Nutrition RD Pager and On-Call Pager Number Located in [place]

## 2021-03-12 ENCOUNTER — Inpatient Hospital Stay (HOSPITAL_COMMUNITY): Payer: Medicaid Other | Admitting: Critical Care Medicine

## 2021-03-12 ENCOUNTER — Inpatient Hospital Stay (HOSPITAL_COMMUNITY): Payer: Medicaid Other

## 2021-03-12 ENCOUNTER — Encounter (HOSPITAL_COMMUNITY)
Admission: EM | Disposition: A | Discharge status: Home / Self Care | Payer: Self-pay | Source: Home / Self Care | Attending: Family Medicine

## 2021-03-12 ENCOUNTER — Encounter (HOSPITAL_COMMUNITY): Payer: Self-pay | Admitting: Internal Medicine

## 2021-03-12 ENCOUNTER — Other Ambulatory Visit: Payer: Self-pay | Admitting: Podiatry

## 2021-03-12 DIAGNOSIS — L97522 Non-pressure chronic ulcer of other part of left foot with fat layer exposed: Secondary | ICD-10-CM

## 2021-03-12 HISTORY — PX: I & D EXTREMITY: SHX5045

## 2021-03-12 LAB — CBC
HCT: 27.8 % — ABNORMAL LOW (ref 36.0–46.0)
Hemoglobin: 9.2 g/dL — ABNORMAL LOW (ref 12.0–15.0)
MCH: 30.8 pg (ref 26.0–34.0)
MCHC: 33.1 g/dL (ref 30.0–36.0)
MCV: 93 fL (ref 80.0–100.0)
Platelets: 324 10*3/uL (ref 150–400)
RBC: 2.99 MIL/uL — ABNORMAL LOW (ref 3.87–5.11)
RDW: 11.8 % (ref 11.5–15.5)
WBC: 7.9 10*3/uL (ref 4.0–10.5)
nRBC: 0 % (ref 0.0–0.2)

## 2021-03-12 LAB — BASIC METABOLIC PANEL
Anion gap: 6 (ref 5–15)
BUN: 19 mg/dL (ref 6–20)
CO2: 27 mmol/L (ref 22–32)
Calcium: 8.3 mg/dL — ABNORMAL LOW (ref 8.9–10.3)
Chloride: 101 mmol/L (ref 98–111)
Creatinine, Ser: 0.95 mg/dL (ref 0.44–1.00)
GFR, Estimated: >60 mL/min (ref >60)
Glucose, Bld: 135 mg/dL — ABNORMAL HIGH (ref 70–99)
Potassium: 3.3 mmol/L — ABNORMAL LOW (ref 3.5–5.1)
Sodium: 134 mmol/L — ABNORMAL LOW (ref 135–145)

## 2021-03-12 LAB — GLUCOSE, CAPILLARY
Glucose-Capillary: 132 mg/dL — ABNORMAL HIGH (ref 70–99)
Glucose-Capillary: 86 mg/dL (ref 70–99)
Glucose-Capillary: 91 mg/dL (ref 70–99)
Glucose-Capillary: 143 mg/dL — ABNORMAL HIGH (ref 70–99)

## 2021-03-12 SURGERY — IRRIGATION AND DEBRIDEMENT EXTREMITY
Anesthesia: General | Laterality: Left

## 2021-03-12 MED ORDER — FENTANYL CITRATE (PF) 100 MCG/2ML IJ SOLN: 25.0000 ug | INTRAMUSCULAR | Status: DC | PRN

## 2021-03-12 MED ORDER — DOXYCYCLINE HYCLATE 100 MG PO CAPS: 100.0000 mg | ORAL_CAPSULE | Freq: Two times a day (BID) | ORAL | 0 refills | Status: AC

## 2021-03-12 MED ORDER — DOXYCYCLINE HYCLATE 100 MG PO TABS
100.0000 mg | ORAL_TABLET | Freq: Two times a day (BID) | ORAL | Status: DC
  Administered 2021-03-12: 100 mg via ORAL
  Filled 2021-03-12: qty 1

## 2021-03-12 MED ORDER — VANCOMYCIN HCL 1250 MG/250ML IV SOLN
1250.0000 mg | INTRAVENOUS | Status: DC
  Filled 2021-03-12: qty 250

## 2021-03-12 MED ORDER — OXYCODONE HCL 5 MG/5ML PO SOLN: 5.0000 mg | Freq: Once | ORAL | Status: DC | PRN

## 2021-03-12 MED ORDER — PHENYLEPHRINE HCL-NACL 10-0.9 MG/250ML-% IV SOLN
INTRAVENOUS | Status: DC | PRN
  Administered 2021-03-12: 50 ug/min via INTRAVENOUS

## 2021-03-12 MED ORDER — ACETAMINOPHEN 500 MG PO TABS: 1000.0000 mg | ORAL_TABLET | Freq: Once | ORAL | Status: DC | PRN

## 2021-03-12 MED ORDER — PROPOFOL 10 MG/ML IV BOLUS
INTRAVENOUS | Status: AC
  Filled 2021-03-12: qty 20

## 2021-03-12 MED ORDER — ONDANSETRON HCL 4 MG/2ML IJ SOLN
INTRAMUSCULAR | Status: DC | PRN
  Administered 2021-03-12: 4 mg via INTRAVENOUS

## 2021-03-12 MED ORDER — ACETAMINOPHEN 10 MG/ML IV SOLN: 1000.0000 mg | Freq: Once | INTRAVENOUS | Status: DC | PRN

## 2021-03-12 MED ORDER — BUPIVACAINE-EPINEPHRINE 0.5% -1:200000 IJ SOLN
INTRAMUSCULAR | Status: AC
  Filled 2021-03-12: qty 1

## 2021-03-12 MED ORDER — LACTATED RINGERS IV SOLN: INTRAVENOUS | Status: DC

## 2021-03-12 MED ORDER — ASCORBIC ACID 500 MG PO TABS: 500.0000 mg | ORAL_TABLET | Freq: Every day | ORAL | 0 refills | Status: DC

## 2021-03-12 MED ORDER — LIDOCAINE-EPINEPHRINE 1 %-1:100000 IJ SOLN
INTRAMUSCULAR | Status: DC | PRN
  Administered 2021-03-12: 10 mL via INTRADERMAL

## 2021-03-12 MED ORDER — PHENYLEPHRINE 40 MCG/ML (10ML) SYRINGE FOR IV PUSH (FOR BLOOD PRESSURE SUPPORT)
PREFILLED_SYRINGE | INTRAVENOUS | Status: DC | PRN
  Administered 2021-03-12 (×2): 80 ug via INTRAVENOUS

## 2021-03-12 MED ORDER — FENTANYL CITRATE (PF) 250 MCG/5ML IJ SOLN
INTRAMUSCULAR | Status: DC | PRN
  Administered 2021-03-12: 25 ug via INTRAVENOUS
  Administered 2021-03-12: 50 ug via INTRAVENOUS
  Administered 2021-03-12 (×2): 25 ug via INTRAVENOUS

## 2021-03-12 MED ORDER — OXYCODONE HCL 5 MG PO TABS: 5.0000 mg | ORAL_TABLET | Freq: Once | ORAL | Status: DC | PRN

## 2021-03-12 MED ORDER — LIDOCAINE HCL (CARDIAC) PF 100 MG/5ML IV SOSY
PREFILLED_SYRINGE | INTRAVENOUS | Status: DC | PRN
  Administered 2021-03-12: 40 mg via INTRATRACHEAL

## 2021-03-12 MED ORDER — LIDOCAINE-EPINEPHRINE 1 %-1:100000 IJ SOLN
INTRAMUSCULAR | Status: AC
  Filled 2021-03-12: qty 1

## 2021-03-12 MED ORDER — CHLORHEXIDINE GLUCONATE 0.12 % MT SOLN
15.0000 mL | Freq: Once | OROMUCOSAL | Status: AC
  Administered 2021-03-12: 15 mL via OROMUCOSAL
  Filled 2021-03-12: qty 15

## 2021-03-12 MED ORDER — BUPIVACAINE HCL 0.5 % IJ SOLN
INTRAMUSCULAR | Status: AC
  Filled 2021-03-12: qty 1

## 2021-03-12 MED ORDER — OXYCODONE-ACETAMINOPHEN 5-325 MG PO TABS: 1.0000 | ORAL_TABLET | ORAL | 0 refills | Status: DC | PRN

## 2021-03-12 MED ORDER — LIDOCAINE HCL (PF) 1 % IJ SOLN
INTRAMUSCULAR | Status: AC
  Filled 2021-03-12: qty 30

## 2021-03-12 MED ORDER — PROPOFOL 10 MG/ML IV BOLUS
INTRAVENOUS | Status: DC | PRN
  Administered 2021-03-12: 10 mg via INTRAVENOUS
  Administered 2021-03-12: 90 mg via INTRAVENOUS
  Administered 2021-03-12: 10 mg via INTRAVENOUS

## 2021-03-12 MED ORDER — FENTANYL CITRATE (PF) 250 MCG/5ML IJ SOLN
INTRAMUSCULAR | Status: AC
  Filled 2021-03-12: qty 5

## 2021-03-12 MED ORDER — MIDAZOLAM HCL 2 MG/2ML IJ SOLN
INTRAMUSCULAR | Status: AC
  Filled 2021-03-12: qty 2

## 2021-03-12 MED ORDER — LIDOCAINE HCL 1 % IJ SOLN
INTRAMUSCULAR | Status: DC | PRN
  Administered 2021-03-12: 10 mL

## 2021-03-12 MED ORDER — ACETAMINOPHEN 160 MG/5ML PO SOLN: 1000.0000 mg | Freq: Once | ORAL | Status: DC | PRN

## 2021-03-12 SURGICAL SUPPLY — 29 items
BNDG COHESIVE 4X5 TAN STRL (GAUZE/BANDAGES/DRESSINGS) ×3 IMPLANT
BNDG ELASTIC 4X5.8 VLCR STR LF (GAUZE/BANDAGES/DRESSINGS) ×3 IMPLANT
BNDG ESMARK 4X9 LF (GAUZE/BANDAGES/DRESSINGS) IMPLANT
BNDG GAUZE ELAST 4 BULKY (GAUZE/BANDAGES/DRESSINGS) ×3 IMPLANT
COVER SURGICAL LIGHT HANDLE (MISCELLANEOUS) ×6 IMPLANT
COVER WAND RF STERILE (DRAPES) IMPLANT
CUFF TOURN SGL QUICK 18X4 (TOURNIQUET CUFF) ×3 IMPLANT
DRSG PAD ABDOMINAL 8X10 ST (GAUZE/BANDAGES/DRESSINGS) ×3 IMPLANT
ELECT REM PT RETURN 9FT ADLT (ELECTROSURGICAL) ×3
ELECTRODE REM PT RTRN 9FT ADLT (ELECTROSURGICAL) ×1 IMPLANT
GAUZE SPONGE 4X4 12PLY STRL (GAUZE/BANDAGES/DRESSINGS) IMPLANT
GLOVE BIO SURGEON STRL SZ7 (GLOVE) ×3 IMPLANT
GLOVE BIOGEL PI IND STRL 7.5 (GLOVE) ×1 IMPLANT
GLOVE BIOGEL PI INDICATOR 7.5 (GLOVE) ×2
GOWN STRL REUS W/ TWL LRG LVL3 (GOWN DISPOSABLE) ×2 IMPLANT
GOWN STRL REUS W/TWL LRG LVL3 (GOWN DISPOSABLE) ×6
HANDPIECE INTERPULSE COAX TIP (DISPOSABLE) ×3
IV NS 1000ML (IV SOLUTION) ×3
IV NS 1000ML BAXH (IV SOLUTION) ×1 IMPLANT
KIT BASIN OR (CUSTOM PROCEDURE TRAY) ×3 IMPLANT
KIT TURNOVER KIT B (KITS) ×3 IMPLANT
MANIFOLD NEPTUNE II (INSTRUMENTS) ×3 IMPLANT
NEEDLE 22X1 1/2 (OR ONLY) (NEEDLE) IMPLANT
NS IRRIG 1000ML POUR BTL (IV SOLUTION) ×3 IMPLANT
PACK ORTHO EXTREMITY (CUSTOM PROCEDURE TRAY) ×3 IMPLANT
PAD ARMBOARD 7.5X6 YLW CONV (MISCELLANEOUS) ×6 IMPLANT
SET HNDPC FAN SPRY TIP SCT (DISPOSABLE) ×1 IMPLANT
SYR CONTROL 10ML LL (SYRINGE) IMPLANT
TOWEL GREEN STERILE (TOWEL DISPOSABLE) ×3 IMPLANT

## 2021-03-12 NOTE — Interval H&P Note (Signed)
History and Physical Interval Note:  03/12/2021 7:17 AM  [name]  has presented today for surgery, with the diagnosis of Left Foot Infection.  The various methods of treatment have been discussed with the patient and family. After consideration of risks, benefits and other options for treatment, the patient has consented to  Procedure(s): 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 (Left) as a surgical intervention.  The patient's history has been reviewed, patient examined, no change in status, stable for surgery.  I have reviewed the patient's chart and labs.  Questions were answered to the patient's satisfaction.     [name]

## 2021-03-12 NOTE — Anesthesia Postprocedure Evaluation (Signed)
Anesthesia Post Note  Patient: [name]  Procedure(s) Performed: 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 (Left)     Patient location during evaluation: PACU Anesthesia Type: General Level of consciousness: awake and patient cooperative Pain management: pain level controlled Vital Signs Assessment: post-procedure vital signs reviewed and stable Respiratory status: spontaneous breathing, nonlabored ventilation, respiratory function stable and patient connected to nasal cannula oxygen Cardiovascular status: blood pressure returned to baseline and stable Postop Assessment: no apparent nausea or vomiting Anesthetic complications: no   No notable events documented.  Last Vitals:  Vitals:   03/12/21 0948 03/12/21 1343  BP: 123/66 111/61  Pulse: 79 82  Resp: 16 16  Temp: 36.7 C 36.5 C  SpO2: 94% 97%    Last Pain:  Vitals:   03/12/21 1453  TempSrc:   PainSc: 3                  [name]

## 2021-03-12 NOTE — Op Note (Signed)
Surgeon: Surgeon(s): [name], DPM  Assistants: None Pre-operative diagnosis: 1.  Left diabetic foot infection 2.  Left foot osteomyelitis Post-operative diagnosis: same Procedure: 1.  Left foot partial second ray amputation with bone biopsy Pathology:  ID Type Source Tests Collected by Time Destination  1 : Left foot clean Margin Tissue PATH Digit amputation SURGICAL PATHOLOGY [name], DPM 03/12/2021 6433   2 : Left foot Dirty Tissue PATH Digit amputation SURGICAL PATHOLOGY [name], DPM 03/12/2021 2951   A :  Wound Abscess AEROBIC CULTURE W GRAM STAIN (SUPERFICIAL SPECIMEN), ANAEROBIC CULTURE W GRAM STAIN [name], [facility] 03/12/2021 8841     Pertinent Intra-op findings: Soft friable bone noted of the metatarsal head of the second with epidermolysis and necrotic skin of the second digit Anesthesia: General  Hemostasis: Anatomic dissection EBL: 10 mL  Materials: 3-0 Prolene Injectables: One-to-one mixture of 1% lidocaine plain half percent Marcaine plain 10 cc Complications: None  Indications for surgery: A 60 y.o. female presents with left foot osteomyelitis with diabetic foot infection. Patient has failed all conservative therapy including but not limited to IV antibiotics and local wound care. She wishes to have surgical correction of the foot/deformity. It was determined that patient would benefit from left foot partial second ray amputation with primary closure. Informed surgical risk consent was reviewed and read aloud to the patient.  I reviewed the films.  I have discussed my findings with the patient in great detail.  I have discussed all risks including but not limited to infection, stiffness, scarring, limp, disability, deformity, damage to blood vessels and nerves, numbness, poor healing, need for braces, arthritis, chronic pain, amputation, death.  All benefits and realistic expectations discussed in great detail.  I have made no promises as to the outcome.  I  have provided realistic expectations.  I have offered the patient a 2nd opinion, which they have declined and assured me they preferred to proceed despite the risks   Procedure in detail: The patient was both verbally and visually identified by myself, the nursing staff, and anesthesia staff in the preoperative holding area. They were then transferred to the operating room and placed on the operative table in supine position.  Attention was directed to the left foot, skin marker was delineated using racquet shaped incision, using 15 blade the incision was carried down from epidermal dermal junction down to the level of the bone.  The second digit was disarticulated metatarsophalangeal joint of the second.  At this time it appeared and clinically noted that there was osteomyelitic presents to the metatarsal head of the second as this was also correlating with submetatarsal 2 ulceration.  Using sagittal transverse metatarsal amputation was performed at the second metatarsal diaphysis in standard technique.  Using 3 L saline and pulse lavage the wound was thoroughly irrigated.  No clinical signs of infection was noted at this time.  A clean margin of the remanent second metatarsal was taken in standard technique and sent to pathology.  A culture was also obtained and sent to microbiology of the clean margin.  At this time the wound appeared clear of infection and was determined that it can be primarily closed.  The wound was primarily closed via 3-0 Prolene in simple interrupted suture technique.  All bony prominences were padded.  The wound was dressed with Betadine soaked gauze, Kerlix, Ace bandage.  At this time patient can be discharged from podiatric standpoint she will need antibiotics based on culture and she  can be partial weightbearing to the heel with Darco wedge shoe.  She will follow-up with me in clinic 1 week from discharge  At the conclusion of the procedure the patient was awoken from  anesthesia and found to have tolerated the procedure well any complications. There were transferred to PACU with vital signs stable and vascular status intact.  Nicholes Rough, DPM

## 2021-03-12 NOTE — Progress Notes (Signed)
Pharmacy Antibiotic Note  [name] is a 60 y.o. female admitted on 03/10/2021 with  diabetic foot infection .  Pharmacy has been consulted for vancomycin dosing. Of note, patient is to continue ceftriaxone for gram negative coverage. S/p partial amputation in OR on 6/12.  WBC 7.9, SCr 0.95 With improvement in SCr, estimated AUC with current dosing of vancomycin 1g q24h was subtherapeutic at 377.   Plan: -Increase vancomycin to 1250 mg IV Q 24 hrs. Goal AUC 400-550. Expected AUC: 477 SCr used: 0.95 -Ceftriaxone per MD -Monitor CBC, renal fx, cultures and clinical progress -Vanc levels as indicated    Height: 5' 5.25" (165.7 cm) Weight: 69.9 kg (154 lb 1.6 oz) IBW/kg (Calculated) : 57.58  Temp (24hrs), Avg:97.8 F (36.6 C), Min:97 F (36.1 C), Max:98.4 F (36.9 C)  Recent Labs  Lab 03/10/21 1555 03/10/21 1755 03/11/21 0019 03/12/21 0228  WBC 12.5*  --  9.0 7.9  CREATININE 1.15*  --  0.95 0.95  LATICACIDVEN 0.8 0.8  --   --      Estimated Creatinine Clearance: 62.9 mL/min (by C-G formula based on SCr of 0.95 mg/dL).    No Known Allergies  Antimicrobials this admission: Vancomycin 6/10 >>  Ceftriaxone 6/10 >>  Flagyl 6/10 >>   Dose adjustments this admission: 6/12: Vancomycin 1000 mg > 1250 mg   Microbiology results: 6/10 BCx: ngtd 6/10 UCx:  sent 6/12 wound: sent   Thank you for allowing pharmacy to be a part of this patient's care.  Sharen Hones, PharmD., BCPS Clinical Pharmacist Please refer to [facility] for unit-specific pharmacist

## 2021-03-12 NOTE — Transfer of Care (Signed)
Immediate Anesthesia Transfer of Care Note  Patient: [name]  Procedure(s) Performed: 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 (Left)  Patient Location: PACU  Anesthesia Type:General  Level of Consciousness: awake and alert   Airway & Oxygen Therapy: Patient Spontanous Breathing and Patient connected to nasal cannula oxygen  Post-op Assessment: Report given to RN and Post -op Vital signs reviewed and stable  Post vital signs: Reviewed and stable  Last Vitals:  Vitals Value Taken Time  BP 121/66   Temp    Pulse 69 03/12/21 0837  Resp 32 03/12/21 0837  SpO2 100 % 03/12/21 0837  Vitals shown include unvalidated device data.  Last Pain:  Vitals:   03/12/21 0652  TempSrc: Oral  PainSc: 0-No pain      Patients Stated Pain Goal: 0 (03/11/21 0035)  Complications: No notable events documented.

## 2021-03-12 NOTE — Plan of Care (Signed)

## 2021-03-12 NOTE — Anesthesia Preprocedure Evaluation (Addendum)
Anesthesia Evaluation  Patient identified by MRN, date of birth, ID band Patient awake    Reviewed: Allergy & Precautions, NPO status , Patient's Chart, lab work & pertinent test results  History of Anesthesia Complications Negative for: history of anesthetic complications  Airway Mallampati: I  TM Distance: >3 FB Neck ROM: Full    Dental  (+) Edentulous Upper, Edentulous Lower, Dental Advisory Given   Pulmonary neg shortness of breath, neg sleep apnea, neg COPD, neg recent URI, Current Smoker and Patient abstained from smoking.,  Covid-19 Nucleic Acid Test Results Lab Results      Component                Value               Date                      SARSCOV2NAA              NEGATIVE            03/10/2021                SARSCOV2NAA              NEGATIVE            03/25/2020              breath sounds clear to auscultation       Cardiovascular hypertension, Pt. on medications  Rhythm:Regular     Neuro/Psych PSYCHIATRIC DISORDERS Bipolar Disorder  Neuromuscular disease    GI/Hepatic negative GI ROS, Neg liver ROS,   Endo/Other  diabetes, Insulin Dependent  Renal/GU Lab Results      Component                Value               Date                      CREATININE               0.95                03/12/2021                Musculoskeletal Left Foot Infection   Abdominal   Peds  Hematology  (+) Blood dyscrasia, anemia , Lab Results      Component                Value               Date                      WBC                      7.9                 03/12/2021                HGB                      9.2 (L)             03/12/2021                HCT  27.8 (L)            03/12/2021                MCV                      93.0                03/12/2021                PLT                      324                 03/12/2021              Anesthesia Other Findings   Reproductive/Obstetrics                             Anesthesia Physical Anesthesia Plan  ASA: 2  Anesthesia Plan: General   Post-op Pain Management:    Induction: Intravenous  PONV Risk Score and Plan: 2 and Ondansetron and Midazolam  Airway Management Planned: LMA  Additional Equipment: None  Intra-op Plan:   Post-operative Plan: Extubation in OR  Informed Consent: I have reviewed the patients History and Physical, chart, labs and discussed the procedure including the risks, benefits and alternatives for the proposed anesthesia with the patient or authorized representative who has indicated his/her understanding and acceptance.     Dental advisory given  Plan Discussed with: CRNA and Surgeon  Anesthesia Plan Comments:         Anesthesia Quick Evaluation

## 2021-03-12 NOTE — Progress Notes (Signed)
AVS given and reviewed with pt and pt's son, Deniece Portela. Medications discussed. All questions answered to satisfaction. Pt's son verbalized understanding of information given. Pt chose to walk off the unit with all belongings with son.

## 2021-03-12 NOTE — Anesthesia Procedure Notes (Signed)
Procedure Name: LMA Insertion Date/Time: 03/12/2021 7:36 AM Performed by: Rachel Moulds, CRNA Pre-anesthesia Checklist: Patient identified, Emergency Drugs available, Suction available and Patient being monitored Patient Re-evaluated:Patient Re-evaluated prior to induction Oxygen Delivery Method: Circle System Utilized Preoxygenation: Pre-oxygenation with 100% oxygen Induction Type: IV induction Ventilation: Mask ventilation without difficulty LMA: LMA inserted LMA Size: 4.0 Number of attempts: 1 Placement Confirmation: positive ETCO2 and breath sounds checked- equal and bilateral Tube secured with: Tape Dental Injury: Teeth and Oropharynx as per pre-operative assessment

## 2021-03-12 NOTE — Progress Notes (Signed)
Pt returned to room 6N32 via bed after surgery. Received report from [place], [place] in PACU. See assessment. Will continue to monitor.

## 2021-03-12 NOTE — Progress Notes (Signed)
Orthopedic Tech Progress Note Patient Details:  IMAGINE NEST [birth year] [id number]  PACU RN called requesting a DARCO SHOE SHOE  Ortho Devices Type of Ortho Device: Darco shoe Ortho Device/Splint Location: LLE Ortho Device/Splint Interventions: Ordered, Application   Post Interventions Patient Tolerated: Well Instructions Provided: Care of device  Donald Pore 03/12/2021, 9:29 AM

## 2021-03-12 NOTE — Discharge Summary (Signed)
Physician Discharge Summary  [name] QDI:[id number] DOB: 1961-08-17 DOA: 03/10/2021  PCP: Rosita Fire, MD  Admit date: 03/10/2021 Discharge date: 03/12/2021  Admitted From: Home Disposition: Home   Recommendations for Outpatient Follow-up:  Follow up with podiatry, Dr. Posey Pronto, in 1 week. Remain PWB to left heel with wide shoe. Remain on doxycycline for 14 days or based on culture results. Please follow up on the following pending results: Intraoperative culture taken 03/12/2021  Home Health: None Equipment/Devices: None Discharge Condition: Stable CODE STATUS: Full Diet recommendation: Heart healthy, carb-modified  Brief/Interim Summary: [name] is a 60 y.o. female with a history of left foot ulcer, IDT2DM with polyneuropathy, HLD, HTN, and tobacco use who was referred to the ED 6/10 by podiatry due to left submetatarsal ulceration that probes to bone worsening despite outpatient management. WBC and inflammatory markers elevated. Pt did not cooperate with MRI. Podiatry performed a partial second ray amputation with primary closure on 03/12/2021 noting osteomyelitic changes in the OR. The patient is stable postoperatively for discharge with plans for 2 weeks of antibiotics to be modified by podiatry based on culture results.  Discharge Diagnoses:  Principal Problem:   Diabetic infection of left foot ([place]) Active Problems:   Osteomyelitis ([place])   Cellulitis of left lower extremity   Polyneuropathy in diabetes (H[facility])   DM2 (diabetes mellitus, type 2) (H[facility])   Diabetic ulcer of left foot ([facility])  Diabetic left foot ulcer with osteomyelitis: S/p partial second ray amputation with primary closure by Dr. Posey Pronto 03/12/2021, no dressing changes recommended.  - F/u intraoperative cultures, plan on doxycycline for 14 days - Partial weightbearing to the heel with a wide shoe. - Follow up with Dr. Posey Pronto 1 week from discharge.  - ABIs: right 1.09; left 0.89. Treat underlying  conditions to minimize risk of progressive atherosclerosis.   Diabetic left foot ulcer: Failing outpatient management. WBC 12.5k, CRP 16.1, ESR 82. - ABIs pending - Continue vancomycin, ceftriaxone, flagyl pending blood culture results and/or operative culture. - MRI not tolerated last night, hopeful to retry today.   Suspected malnutrition: Prealbumin is 7.0. - Empiric vitamin C   IDT2DM: Poorly controlled, with polyneuropathy. Last HbA1c was 9.9%. - Follow up with PCP   HTN: - Normotensive off medications.   HLD: - Restart statin    Hypokalemia: - Supplemented     Tobacco use: - Cessation counseling   Pyuria: On very contaminated specimen. No symptoms. No Tx planned.    Stage 2 right, medial buttock pressure injury POA: Offload as able.  Discharge Instructions  Allergies as of 03/12/2021   No Known Allergies      Medication List     TAKE these medications    Accu-Chek Guide test strip Generic drug: glucose blood USE TO TEST 3 TIMESDDAILY.   Accu-Chek Softclix Lancets lancets 3 (three) times daily.   ascorbic acid 500 MG tablet Commonly known as: VITAMIN C Take 1 tablet (500 mg total) by mouth daily.   aspirin 325 MG tablet Take 325 mg by mouth every 4 (four) hours as needed. pain   atorvastatin 40 MG tablet Commonly known as: LIPITOR Take 1 tablet by mouth daily.   BENADRYL PO Take 25 mg by mouth every 6 (six) hours as needed (itching).   blood glucose meter kit and supplies Dispense based on patient and insurance preference. Use up to four times daily as directed. (FOR ICD-10 E10.9, E11.9).   cholecalciferol 25 MCG (1000 UNIT) tablet Commonly known as: VITAMIN D3 Take  1,000 Units by mouth daily.   doxycycline 100 MG capsule Commonly known as: VIBRAMYCIN Take 1 capsule (100 mg total) by mouth 2 (two) times daily for 14 days.   gabapentin 100 MG capsule Commonly known as: NEURONTIN Take 1 capsule (100 mg total) by mouth 3 (three) times  daily.   glipiZIDE 5 MG tablet Commonly known as: GLUCOTROL Take 5 mg by mouth 2 (two) times daily.   insulin glargine 100 UNIT/ML Solostar Pen Commonly known as: LANTUS Inject 10 Units into the skin at bedtime.   insulin lispro 100 UNIT/ML KwikPen Commonly known as: HUMALOG Inject 0.03 mLs (3 Units total) into the skin with breakfast, with lunch, and with evening meal.   Insulin Pen Needle 31G X 5 MM Misc 1 Device by Does not apply route as directed.   lisinopril-hydrochlorothiazide 10-12.5 MG tablet Commonly known as: ZESTORETIC Take 1 tablet by mouth daily.   metFORMIN 850 MG tablet Commonly known as: GLUCOPHAGE Take 850 mg by mouth 2 (two) times daily.   oxybutynin 10 MG 24 hr tablet Commonly known as: DITROPAN-XL Take 1 tablet (10 mg total) by mouth at bedtime.   ProAir HFA 108 (90 Base) MCG/ACT inhaler Generic drug: albuterol Inhale 2 puffs into the lungs every 4 (four) hours as needed for wheezing.        Follow-up Information     Felipa Furnace, DPM. Go in 1 week(s).   Specialty: Podiatry Contact information: [address] [place] [address] [phone]                No Known Allergies  Consultations: Podiatry, Dr. Posey Pronto  Procedures/Studies: DG Chest Port 1 View  Result Date: 03/10/2021 CLINICAL DATA:  Possible sepsis.  Smoker. EXAM: PORTABLE CHEST 1 VIEW COMPARISON:  07/05/2020 FINDINGS: Normal sized heart. Clear lungs. Old, healed right humeral neck fracture. IMPRESSION: No acute abnormality. Electronically Signed   By: Claudie Revering M.D.   On: 03/10/2021 17:40   DG Foot Complete Left  Result Date: 03/12/2021 CLINICAL DATA:  Status post amputation of second toe. EXAM: LEFT FOOT - COMPLETE 3+ VIEW COMPARISON:  03/10/2021 FINDINGS: Diffuse soft tissue swelling. Soft tissue gas which could be postoperative or related to residual infection. Status post amputation of the second digit at the level of the distal metatarsal. Small calcaneal  spur. IMPRESSION: Interval second digit partial amputation with residual soft tissue swelling and gas as detailed above. Electronically Signed   By: Abigail Miyamoto M.D.   On: 03/12/2021 09:23   DG Foot Complete Left  Result Date: 03/10/2021 CLINICAL DATA:  Plantar left foot pain and possible sepsis. EXAM: LEFT FOOT - COMPLETE 3+ VIEW COMPARISON:  04/01/2020 FINDINGS: Soft tissue gas and swelling adjacent to distal 1st metatarsal and 2nd toe. On the frontal view, there is a suggestion of subluxation or dislocation at the 2nd MTP joint, with possible corresponding dorsal subluxation of the 2nd toe at the MTP joint on the lateral view, difficult to assess due to overlapping of the toes. No bone destruction or periosteal reaction. IMPRESSION: 1. Soft tissue gas and swelling in the medial aspect of the distal foot, suspicious for infection with a gas-forming organism. 2. Suggestion of subluxation/dislocation of the 2nd MTP joint. 3. No bony changes of osteomyelitis. Electronically Signed   By: Claudie Revering M.D.   On: 03/10/2021 17:52   VAS [place] ABI WITH/WO TBI  Result Date: 03/11/2021  LOWER EXTREMITY DOPPLER STUDY Patient Name:  [name]  Date of Exam:  03/11/2021 Medical Rec #: [id number]          Accession #:    [id number] Date of Birth: October 16, 1960          Patient Gender: F Patient Age:   80Y Exam Location:  [facility] Procedure:      VAS [place] ABI WITH/WO TBI Referring Phys: [id number] JARED M GARDNER --------------------------------------------------------------------------------  Indications: Ulceration, and gangrene. High Risk Factors: Hypertension, Diabetes, current smoker.  Comparison Study: No prior study on file Performing Technologist: Sharion Dove RVS  Examination Guidelines: A complete evaluation includes at minimum, Doppler waveform signals and systolic blood pressure reading at the level of bilateral brachial, anterior tibial, and posterior tibial arteries, when vessel segments are  accessible. Bilateral testing is considered an integral part of a complete examination. Photoelectric Plethysmograph (PPG) waveforms and toe systolic pressure readings are included as required and additional duplex testing as needed. Limited examinations for reoccurring indications may be performed as noted.  ABI Findings: +---------+------------------+-----+-----------+--------+ Right    Rt Pressure (mmHg)IndexWaveform   Comment  +---------+------------------+-----+-----------+--------+ Brachial 120                    multiphasic         +---------+------------------+-----+-----------+--------+ PTA      129               1.05 multiphasic         +---------+------------------+-----+-----------+--------+ DP       134               1.09 multiphasic         +---------+------------------+-----+-----------+--------+ Great Toe117               0.95                     +---------+------------------+-----+-----------+--------+ +---------+------------------+-----+-----------+-------+ Left     Lt Pressure (mmHg)IndexWaveform   Comment +---------+------------------+-----+-----------+-------+ Brachial 123                    multiphasic        +---------+------------------+-----+-----------+-------+ PTA      104               0.85 multiphasic        +---------+------------------+-----+-----------+-------+ DP       109               0.89 multiphasic        +---------+------------------+-----+-----------+-------+ Great Toe                                  bandage +---------+------------------+-----+-----------+-------+ +-------+-----------+-----------+------------+------------+ ABI/TBIToday's ABIToday's TBIPrevious ABIPrevious TBI +-------+-----------+-----------+------------+------------+ Right  1.09       0.95                                +-------+-----------+-----------+------------+------------+ Left   0.89                                            +-------+-----------+-----------+------------+------------+  Summary: Right: Resting right ankle-brachial index is within normal range. No evidence of significant right lower extremity arterial disease. The right toe-brachial index is normal. Left: Resting left ankle-brachial index indicates mild left lower extremity arterial disease. Waveform appears normal, unable to get pressure  secondary to bandage.  *See table(s) above for measurements and observations.  Electronically signed by Jamelle Haring on 03/11/2021 at 7:20:52 PM.    Final     03/12/2021 Dr. Posey Pronto: Left foot partial second ray amputation with bone biopsy  Subjective: Feels well, wants to go home.   Discharge Exam: Vitals:   03/12/21 0852 03/12/21 0907  BP: 138/60 123/63  Pulse: 89 77  Resp: 20 14  Temp:  (!) 97 F (36.1 C)  SpO2: 97% 95%   General: Pt is alert, awake, not in acute distress Cardiovascular: RRR, S1/S2 +, no rubs, no gallops Respiratory: CTA bilaterally, no wheezing, no rhonchi Abdominal: Soft, NT, ND, bowel sounds + Extremities: No edema, no cyanosis. Darco shoe LLE applied. 2nd toe surgically absent, other toes w/intact sensation, motor function.   Labs: BNP (last 3 results) No results for input(s): BNP in the last 8760 hours. Basic Metabolic Panel: Recent Labs  Lab 03/10/21 1555 03/11/21 0019 03/12/21 0228  NA 133* 137 134*  K 3.3* 3.4* 3.3*  CL 95* 100 101  CO2 26 23 27   GLUCOSE 109* 141* 135*  BUN 26* 20 19  CREATININE 1.15* 0.95 0.95  CALCIUM 9.0 8.8* 8.3*   Liver Function Tests: Recent Labs  Lab 03/10/21 1555  AST 25  ALT 27  ALKPHOS 113  BILITOT 0.6  PROT 6.8  ALBUMIN 2.9*   CBC: Recent Labs  Lab 03/10/21 1555 03/11/21 0019 03/12/21 0228  WBC 12.5* 9.0 7.9  NEUTROABS 9.5*  --   --   HGB 10.6* 10.2* 9.2*  HCT 31.9* 30.4* 27.8*  MCV 92.7 91.8 93.0  PLT 372 319 324    CBG: Recent Labs  Lab 03/11/21 2019 03/11/21 2307 03/12/21 0440 03/12/21 0702 03/12/21 0841   GLUCAP 195* 170* 132* 86 91   Urinalysis    Component Value Date/Time   COLORURINE AMBER (A) 03/10/2021 1919   APPEARANCEUR CLOUDY (A) 03/10/2021 1919   LABSPEC 1.014 03/10/2021 1919   PHURINE 5.0 03/10/2021 1919   GLUCOSEU NEGATIVE 03/10/2021 1919   HGBUR SMALL (A) 03/10/2021 1919   BILIRUBINUR NEGATIVE 03/10/2021 1919   KETONESUR NEGATIVE 03/10/2021 1919   PROTEINUR 30 (A) 03/10/2021 1919   UROBILINOGEN 0.2 06/23/2013 1750   NITRITE POSITIVE (A) 03/10/2021 1919   LEUKOCYTESUR LARGE (A) 03/10/2021 1919    Microbiology Recent Results (from the past 240 hour(s))  Blood Culture (routine x 2)     Status: None (Preliminary result)   Collection Time: 03/10/21  4:00 PM   Specimen: BLOOD RIGHT FOREARM  Result Value Ref Range Status   Specimen Description BLOOD RIGHT FOREARM  Final   Special Requests   Final    BOTTLES DRAWN AEROBIC AND ANAEROBIC Blood Culture adequate volume   Culture   Final    NO GROWTH 2 DAYS Performed at [facility], [address], [place], [place] [address]    Report Status PENDING  Incomplete  Blood Culture (routine x 2)     Status: None (Preliminary result)   Collection Time: 03/10/21  4:20 PM   Specimen: BLOOD LEFT FOREARM  Result Value Ref Range Status   Specimen Description BLOOD LEFT FOREARM  Final   Special Requests   Final    BOTTLES DRAWN AEROBIC AND ANAEROBIC Blood Culture adequate volume   Culture   Final    NO GROWTH 2 DAYS Performed at [facility], [place] [address] [place], [place] [address]    Report Status PENDING  Incomplete  Resp Panel by RT-PCR (Flu A&B, Covid) Nasopharyngeal Swab     Status: None   Collection Time: 03/10/21  5:30 PM   Specimen: Nasopharyngeal Swab; Nasopharyngeal(NP) swabs in vial transport medium  Result Value Ref Range Status   SARS Coronavirus 2 by RT PCR NEGATIVE NEGATIVE Final    Comment: (NOTE) SARS-CoV-2 target nucleic acids are NOT DETECTED.  The SARS-CoV-2 RNA is generally detectable in  upper respiratory specimens during the acute phase of infection. The lowest concentration of SARS-CoV-2 viral copies this assay can detect is 138 copies/mL. A negative result does not preclude SARS-Cov-2 infection and should not be used as the sole basis for treatment or other patient management decisions. A negative result may occur with  improper specimen collection/handling, submission of specimen other than nasopharyngeal swab, presence of viral mutation(s) within the areas targeted by this assay, and inadequate number of viral copies(<138 copies/mL). A negative result must be combined with clinical observations, patient history, and epidemiological information. The expected result is Negative.  Fact Sheet for Patients:  EntrepreneurPulse.com.au  Fact Sheet for Healthcare Providers:  IncredibleEmployment.be  This test is no t yet approved or cleared by the [place] FDA and  has been authorized for detection and/or diagnosis of SARS-CoV-2 by FDA under an Emergency Use Authorization (EUA). This EUA will remain  in effect (meaning this test can be used) for the duration of the COVID-19 declaration under Section 564(b)(1) of the Act, 21 U.S.C.section 360bbb-3(b)(1), unless the authorization is terminated  or revoked sooner.       Influenza A by PCR NEGATIVE NEGATIVE Final   Influenza B by PCR NEGATIVE NEGATIVE Final    Comment: (NOTE) The Xpert Xpress SARS-CoV-2/FLU/RSV plus assay is intended as an aid in the diagnosis of influenza from Nasopharyngeal swab specimens and should not be used as a sole basis for treatment. Nasal washings and aspirates are unacceptable for Xpert Xpress SARS-CoV-2/FLU/RSV testing.  Fact Sheet for Patients: EntrepreneurPulse.com.au  Fact Sheet for Healthcare Providers: IncredibleEmployment.be  This test is not yet approved or cleared by the [place] FDA and has been  authorized for detection and/or diagnosis of SARS-CoV-2 by FDA under an Emergency Use Authorization (EUA). This EUA will remain in effect (meaning this test can be used) for the duration of the COVID-19 declaration under Section 564(b)(1) of the Act, 21 U.S.C. section 360bbb-3(b)(1), unless the authorization is terminated or revoked.  Performed at [facility], [place] [address] [place], [place] [address]   Surgical pcr screen     Status: None   Collection Time: 03/11/21  8:44 PM   Specimen: Nasal Mucosa; Nasal Swab  Result Value Ref Range Status   MRSA, PCR NEGATIVE NEGATIVE Final   Staphylococcus aureus NEGATIVE NEGATIVE Final    Comment: (NOTE) The Xpert SA Assay (FDA approved for NASAL specimens in patients 70 years of age and older), is one component of a comprehensive surveillance program. It is not intended to diagnose infection nor to guide or monitor treatment. Performed at [facility], [place] [address] [place], [place] [address]     Time coordinating discharge: Approximately 40 minutes  Patrecia Pour, MD  [facility] 03/12/2021, 9:33 AM

## 2021-03-13 ENCOUNTER — Encounter (HOSPITAL_COMMUNITY): Payer: Self-pay | Admitting: Podiatry

## 2021-03-13 LAB — HEMOGLOBIN A1C
Hgb A1c MFr Bld: 6.5 % — ABNORMAL HIGH (ref 4.8–5.6)
Mean Plasma Glucose: 140 mg/dL

## 2021-03-13 LAB — GLUCOSE, CAPILLARY: Glucose-Capillary: 80 mg/dL (ref 70–99)

## 2021-03-15 LAB — CULTURE, BLOOD (ROUTINE X 2)
Culture: NO GROWTH
Culture: NO GROWTH
Special Requests: ADEQUATE
Special Requests: ADEQUATE

## 2021-03-15 LAB — SURGICAL PATHOLOGY

## 2021-03-17 ENCOUNTER — Other Ambulatory Visit: Payer: Self-pay

## 2021-03-17 ENCOUNTER — Ambulatory Visit (INDEPENDENT_AMBULATORY_CARE_PROVIDER_SITE_OTHER): Payer: Medicaid Other | Admitting: Podiatry

## 2021-03-17 DIAGNOSIS — Z9889 Other specified postprocedural states: Secondary | ICD-10-CM

## 2021-03-17 DIAGNOSIS — Z89422 Acquired absence of other left toe(s): Secondary | ICD-10-CM

## 2021-03-17 LAB — AEROBIC/ANAEROBIC CULTURE W GRAM STAIN (SURGICAL/DEEP WOUND): Gram Stain: NONE SEEN

## 2021-03-20 ENCOUNTER — Telehealth: Payer: Self-pay | Admitting: *Deleted

## 2021-03-20 MED ORDER — OXYCODONE-ACETAMINOPHEN 5-325 MG PO TABS: 1.0000 | ORAL_TABLET | ORAL | 0 refills | Status: DC | PRN

## 2021-03-20 NOTE — Telephone Encounter (Signed)
Patient's son is requesting a refill of oxyCodone-ace, 5-325 mg. Please advise.

## 2021-03-22 ENCOUNTER — Encounter: Payer: Self-pay | Admitting: Podiatry

## 2021-03-22 NOTE — Progress Notes (Signed)
Subjective:  Patient ID: [name], female    DOB: 08-17-1961,  MRN: [id number]  Chief Complaint  Patient presents with   Routine Post Op    Pt states she has had some vomiting. Denies fever/chills.    DOS: 03/12/2021 Procedure: Left incision and drainage with partial second ray amputation  60 y.o. female returns for post-op check.  Patient states she is doing well.  She would like to discuss local wound care.  She does not have any pain.  She has some vomiting.  But overall doing much better she is ambulating with a surgical shoe.  Review of Systems: Negative except as noted in the HPI. Denies N/V/F/Ch.  Past Medical History:  Diagnosis Date   Diabetes mellitus without complication ([place])    Hidradenitis 12/13/2015   Hypercholesterolemia    Hypertension    Smoker 12/13/2015    Current Outpatient Medications:    ACCU-CHEK GUIDE test strip, USE TO TEST 3 TIMESDDAILY., Disp: , Rfl:    Accu-Chek Softclix Lancets lancets, 3 (three) times daily., Disp: , Rfl:    ascorbic acid (VITAMIN C) 500 MG tablet, Take 1 tablet (500 mg total) by mouth daily., Disp: 30 tablet, Rfl: 0   aspirin 325 MG tablet, Take 325 mg by mouth every 4 (four) hours as needed. pain, Disp: , Rfl:    atorvastatin (LIPITOR) 40 MG tablet, Take 1 tablet by mouth daily., Disp: , Rfl:    blood glucose meter kit and supplies, Dispense based on patient and insurance preference. Use up to four times daily as directed. (FOR ICD-10 E10.9, E11.9)., Disp: 1 each, Rfl: 0   cholecalciferol (VITAMIN D3) 25 MCG (1000 UNIT) tablet, Take 1,000 Units by mouth daily., Disp: , Rfl:    diphenhydrAMINE HCl (BENADRYL PO), Take 25 mg by mouth every 6 (six) hours as needed (itching)., Disp: , Rfl:    doxycycline (VIBRA-TABS) 100 MG tablet, Take 100 mg by mouth 2 (two) times daily., Disp: , Rfl:    doxycycline (VIBRAMYCIN) 100 MG capsule, Take 1 capsule (100 mg total) by mouth 2 (two) times daily for 14 days., Disp: 28 capsule, Rfl: 0    gabapentin (NEURONTIN) 100 MG capsule, Take 1 capsule (100 mg total) by mouth 3 (three) times daily., Disp: 90 capsule, Rfl: 3   glipiZIDE (GLUCOTROL) 5 MG tablet, Take 5 mg by mouth 2 (two) times daily., Disp: , Rfl:    insulin glargine (LANTUS) 100 UNIT/ML Solostar Pen, Inject 10 Units into the skin at bedtime., Disp: 15 mL, Rfl: 1   insulin lispro (HUMALOG) 100 UNIT/ML KwikPen, Inject 0.03 mLs (3 Units total) into the skin with breakfast, with lunch, and with evening meal., Disp: 15 mL, Rfl: 1   Insulin Pen Needle 31G X 5 MM MISC, 1 Device by Does not apply route as directed., Disp: 100 each, Rfl: 1   lisinopril-hydrochlorothiazide (PRINZIDE,ZESTORETIC) 10-12.5 MG tablet, Take 1 tablet by mouth daily., Disp: , Rfl:    metFORMIN (GLUCOPHAGE) 850 MG tablet, Take 850 mg by mouth 2 (two) times daily., Disp: , Rfl:    oxybutynin (DITROPAN-XL) 10 MG 24 hr tablet, Take 1 tablet (10 mg total) by mouth at bedtime., Disp: 30 tablet, Rfl: 3   oxyCODONE-acetaminophen (PERCOCET) 5-325 MG tablet, Take 1-2 tablets by mouth every 4 (four) hours as needed for severe pain., Disp: 30 tablet, Rfl: 0   PROAIR HFA 108 (90 Base) MCG/ACT inhaler, Inhale 2 puffs into the lungs every 4 (four) hours as needed for wheezing., Disp: ,  Rfl:    oxyCODONE-acetaminophen (PERCOCET) 5-325 MG tablet, Take 1-2 tablets by mouth every 4 (four) hours as needed for severe pain., Disp: 30 tablet, Rfl: 0  Social History   Tobacco Use  Smoking Status Every Day   Packs/day: 0.50   Years: 34.00   Pack years: 17.00   Types: Cigarettes  Smokeless Tobacco Never    No Known Allergies Objective:  There were no vitals filed for this visit. There is no height or weight on file to calculate BMI. Constitutional Well developed. Well nourished.  Vascular Foot warm and well perfused. Capillary refill normal to all digits.   Neurologic Normal speech. Oriented to person, place, and time. Epicritic sensation to light touch grossly present  bilaterally.  Dermatologic Skin healing well without signs of infection. Skin edges well coapted without signs of infection.  Orthopedic: Tenderness to palpation noted about the surgical site.   Radiographs: None Assessment:   1. 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 Status post foot surgery    Plan:  Patient was evaluated and treated and all questions answered.  S/p foot surgery left -Progressing as expected post-operatively. -XR: None -WB Status: Weight-bear as tolerated in Darco wedge shoe -Sutures: Intact.  No clinical signs of dehiscence noted no complication noted. -Medications: I will hold off on antibiotics as her cultures are negative for any other growth especially on the clean margin. -Foot redressed.  No follow-ups on file.

## 2021-03-29 ENCOUNTER — Telehealth: Payer: Self-pay | Admitting: *Deleted

## 2021-03-29 NOTE — Telephone Encounter (Signed)
Patient's son is calling to request a refill of pain medicine(oxycodone-ace,5-325 mg) for mom. Please advise.

## 2021-03-30 ENCOUNTER — Other Ambulatory Visit: Payer: Self-pay

## 2021-03-30 ENCOUNTER — Emergency Department (HOSPITAL_COMMUNITY)
Admission: EM | Admit: 2021-03-30 | Discharge: 2021-04-04 | Disposition: A | Discharge status: Other Acute Inpatient Hospital | Payer: Medicaid Other | Attending: Emergency Medicine | Admitting: Emergency Medicine

## 2021-03-30 ENCOUNTER — Encounter (HOSPITAL_COMMUNITY): Payer: Self-pay | Admitting: Emergency Medicine

## 2021-03-30 DIAGNOSIS — I10 Essential (primary) hypertension: Secondary | ICD-10-CM | POA: Insufficient documentation

## 2021-03-30 DIAGNOSIS — Z20822 Contact with and (suspected) exposure to covid-19: Secondary | ICD-10-CM | POA: Diagnosis not present

## 2021-03-30 DIAGNOSIS — F3110 Bipolar disorder, current episode manic without psychotic features, unspecified: Secondary | ICD-10-CM | POA: Diagnosis present

## 2021-03-30 DIAGNOSIS — F99 Mental disorder, not otherwise specified: Secondary | ICD-10-CM | POA: Diagnosis not present

## 2021-03-30 DIAGNOSIS — R45851 Suicidal ideations: Secondary | ICD-10-CM | POA: Diagnosis not present

## 2021-03-30 DIAGNOSIS — Z008 Encounter for other general examination: Secondary | ICD-10-CM

## 2021-03-30 DIAGNOSIS — Z046 Encounter for general psychiatric examination, requested by authority: Secondary | ICD-10-CM | POA: Diagnosis present

## 2021-03-30 DIAGNOSIS — E119 Type 2 diabetes mellitus without complications: Secondary | ICD-10-CM | POA: Insufficient documentation

## 2021-03-30 DIAGNOSIS — F17210 Nicotine dependence, cigarettes, uncomplicated: Secondary | ICD-10-CM | POA: Insufficient documentation

## 2021-03-30 DIAGNOSIS — F3173 Bipolar disorder, in partial remission, most recent episode manic: Secondary | ICD-10-CM | POA: Diagnosis not present
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
## 2021-03-31 LAB — COMPREHENSIVE METABOLIC PANEL
ALT: 21 U/L (ref 0–44)
AST: 22 U/L (ref 15–41)
Albumin: 3.9 g/dL (ref 3.5–5.0)
Alkaline Phosphatase: 141 U/L — ABNORMAL HIGH (ref 38–126)
Anion gap: 7 (ref 5–15)
BUN: 18 mg/dL (ref 6–20)
CO2: 29 mmol/L (ref 22–32)
Calcium: 9 mg/dL (ref 8.9–10.3)
Chloride: 100 mmol/L (ref 98–111)
Creatinine, Ser: 0.9 mg/dL (ref 0.44–1.00)
GFR, Estimated: >60 mL/min (ref >60)
Glucose, Bld: 95 mg/dL (ref 70–99)
Potassium: 3.9 mmol/L (ref 3.5–5.1)
Sodium: 136 mmol/L (ref 135–145)
Total Bilirubin: 0.5 mg/dL (ref 0.3–1.2)
Total Protein: 7.3 g/dL (ref 6.5–8.1)

## 2021-03-31 LAB — CBC WITH DIFFERENTIAL/PLATELET
Abs Immature Granulocytes: 0.04 10*3/uL (ref 0.00–0.07)
Basophils Absolute: 0 10*3/uL (ref 0.0–0.1)
Basophils Relative: 0 %
Eosinophils Absolute: 0.2 10*3/uL (ref 0.0–0.5)
Eosinophils Relative: 3 %
HCT: 37.8 % (ref 36.0–46.0)
Hemoglobin: 11.9 g/dL — ABNORMAL LOW (ref 12.0–15.0)
Immature Granulocytes: 0 %
Lymphocytes Relative: 25 %
Lymphs Abs: 2.4 10*3/uL (ref 0.7–4.0)
MCH: 30.7 pg (ref 26.0–34.0)
MCHC: 31.5 g/dL (ref 30.0–36.0)
MCV: 97.7 fL (ref 80.0–100.0)
Monocytes Absolute: 0.8 10*3/uL (ref 0.1–1.0)
Monocytes Relative: 8 %
Neutro Abs: 6 10*3/uL (ref 1.7–7.7)
Neutrophils Relative %: 64 %
Platelets: 313 10*3/uL (ref 150–400)
RBC: 3.87 MIL/uL (ref 3.87–5.11)
RDW: 13.5 % (ref 11.5–15.5)
WBC: 9.4 10*3/uL (ref 4.0–10.5)
nRBC: 0 % (ref 0.0–0.2)

## 2021-03-31 LAB — ETHANOL: Alcohol, Ethyl (B): <10 mg/dL (ref <10)

## 2021-03-31 LAB — RAPID URINE DRUG SCREEN, HOSP PERFORMED
Amphetamines: NOT DETECTED
Barbiturates: NOT DETECTED
Benzodiazepines: NOT DETECTED
Cocaine: NOT DETECTED
Opiates: NOT DETECTED
Tetrahydrocannabinol: NOT DETECTED

## 2021-03-31 LAB — POC URINE PREG, ED: Preg Test, Ur: NEGATIVE

## 2021-03-31 MED ORDER — QUETIAPINE FUMARATE 25 MG PO TABS
50.0000 mg | ORAL_TABLET | Freq: Every day | ORAL | Status: DC
  Administered 2021-03-31 – 2021-04-03 (×4): 50 mg via ORAL
  Filled 2021-03-31 (×4): qty 2

## 2021-03-31 NOTE — ED Notes (Signed)
Pt walking around ED refusing to go back to her room. Pt tried to walk into another pts room. Tried redirecting but pt refuses. Security notified.

## 2021-03-31 NOTE — ED Notes (Signed)
Pt's son called to let us know that pt requires QOD dsg changes (clean with betadine and place clean dsg on L foot wound). Son states last dsg changed 7pm on 03/30/21. Son also states that pt has 2 days left on her post surgery doxycycline prescription. Son's name is Lyndal Pulley and he can be reached at [phone].

## 2021-03-31 NOTE — ED Notes (Signed)
Update given to son

## 2021-03-31 NOTE — ED Notes (Signed)
IVC paperwork faxed to [facility] 
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
## 2021-03-31 NOTE — BH Assessment (Addendum)
Disposition:    Inpatient geriatric psychiatric hospitalization recommended, per Doran Heater, NP. @1942 , Patient was faxed out to multiple facilities for consideration of bed placement. See listed facilities below. CSW to continue following patient's disposition.    Referrals faxed to the following facilities: [facility]  Pending - Request Sent N/A [address] [place] [phone] --  [facility]  Pending - Request Sent N/A [address] [place] [phone] --  [facility]  Pending - Request Sent N/A [address] [place] [address] [phone] --  [facility]  Pending - Request Sent N/A [address], [place] [phone] --  [facility]  Pending - Request Sent N/A [address] [place] [place] [phone] --  [facility]  Pending - Request Sent N/A [address] [place] [phone] --  [facility]  Pending - Request Sent N/A [phone] [address] [place] [place] [phone] --  [facility]  Pending - Request Sent N/A [address], [place] [phone] --  [facility]  Pending - Request Sent N/A [address] [place], [place] [phone] --  [facility]  Pending - Request Sent N/A [address] [place] [phone] --  [facility]  Pending - Request Sent N/A [address], [place] [address] [place] [address] [phone] --  [facility]  Pending - Request Sent N/A [address] [place] [phone] --  [facility]  Pending - Request Sent N/A [address] [place] [phone] --   [facility]  Pending - Request Sent N/A [address] [place] [phone] --  [facility]  Pending - Request Sent N/A [address] [place] [phone] --  [facility]  Pending - Request Sent N/A [address] [place] [phone] --  [facility]  Pending - Request Sent N/A [address], [place] [phone] --  [facility]  Pending - Request Sent N/A [address], [place] [phone] --  [facility]  Pending - Request Sent N/A [address] [place] [address] [phone] --  [facility]  Pending - Request Sent N/A [address] [place], [place] [phone] --  [facility]  Pending - Request Sent N/A [address] [place] [phone] --  [facility]  Pending - Request Sent N/A [address], [place] [phone] --  [facility]  Pending - Request Sent N/A [address] [place], [place] [phone] --  [facility]  Pending - Request Sent N/A [address] [place], [place] [phone] --  [facility]  Pending - Request Sent N/A [address] [place] [phone] --  [facility]  Pending - Request Sent N/A [address] [place] [place] [phone] --  [facility]  Pending - Request Sent N/A [address] [place] [address] [phone] --  [facility]   Pending - Request Sent N/A [address] [place]  [place] [address] [phone] --  [facility]  Pending - Request Sent N/A [address] [place] [address] [phone] --  [facility]  Pending - Request Sent N/A [address] [place] [address] [phone] --  [facility]  Pending - Request Sent N/A [address] [place] [address] [phone] --  [facility]  Pending - Request Sent N/A [address], [place] [address] [phone] --  [facility]  Pending - Request Sent N/A [address], [place] [address] [phone]

## 2021-03-31 NOTE — ED Notes (Signed)
Pt ambulated to the bathroom then back to room

## 2021-03-31 NOTE — ED Notes (Signed)
Security and [employer] officer at bedside. Pt redirected back to room.

## 2021-04-01 MED ORDER — ASCORBIC ACID 500 MG PO TABS
500.0000 mg | ORAL_TABLET | Freq: Every day | ORAL | Status: DC
  Administered 2021-04-01 – 2021-04-03 (×3): 500 mg via ORAL
  Filled 2021-04-01 (×3): qty 1

## 2021-04-01 MED ORDER — OXYCODONE-ACETAMINOPHEN 5-325 MG PO TABS
1.0000 | ORAL_TABLET | Freq: Three times a day (TID) | ORAL | Status: DC | PRN
Start: 2021-04-01 — End: 2021-04-04

## 2021-04-01 MED ORDER — ALBUTEROL SULFATE HFA 108 (90 BASE) MCG/ACT IN AERS: 2.0000 | INHALATION_SPRAY | RESPIRATORY_TRACT | Status: DC | PRN

## 2021-04-01 MED ORDER — OXYBUTYNIN CHLORIDE ER 5 MG PO TB24
10.0000 mg | ORAL_TABLET | Freq: Every day | ORAL | Status: DC
  Administered 2021-04-01 – 2021-04-03 (×3): 10 mg via ORAL
  Filled 2021-04-01 (×3): qty 2

## 2021-04-01 MED ORDER — ATORVASTATIN CALCIUM 40 MG PO TABS
40.0000 mg | ORAL_TABLET | Freq: Every day | ORAL | Status: DC
  Administered 2021-04-01 – 2021-04-03 (×3): 40 mg via ORAL
  Filled 2021-04-01 (×3): qty 1

## 2021-04-01 MED ORDER — VITAMIN D 25 MCG (1000 UNIT) PO TABS
1000.0000 [IU] | ORAL_TABLET | Freq: Every day | ORAL | Status: DC
  Administered 2021-04-01 – 2021-04-03 (×3): 1000 [IU] via ORAL
  Filled 2021-04-01 (×3): qty 1

## 2021-04-01 MED ORDER — LISINOPRIL-HYDROCHLOROTHIAZIDE 10-12.5 MG PO TABS: 1.0000 | ORAL_TABLET | Freq: Every day | ORAL | Status: DC

## 2021-04-01 MED ORDER — HYDROCHLOROTHIAZIDE 12.5 MG PO CAPS
12.5000 mg | ORAL_CAPSULE | Freq: Every day | ORAL | Status: DC
  Administered 2021-04-01 – 2021-04-03 (×3): 12.5 mg via ORAL
  Filled 2021-04-01 (×3): qty 1

## 2021-04-01 MED ORDER — GLIPIZIDE 5 MG PO TABS
5.0000 mg | ORAL_TABLET | Freq: Two times a day (BID) | ORAL | Status: DC
  Administered 2021-04-01 – 2021-04-03 (×6): 5 mg via ORAL
  Filled 2021-04-01 (×6): qty 1

## 2021-04-01 MED ORDER — INSULIN GLARGINE 100 UNIT/ML ~~LOC~~ SOLN
10.0000 [IU] | Freq: Every day | SUBCUTANEOUS | Status: DC
  Administered 2021-04-01 – 2021-04-03 (×2): 10 [IU] via SUBCUTANEOUS
  Filled 2021-04-01 (×4): qty 0.1

## 2021-04-01 MED ORDER — METFORMIN HCL 850 MG PO TABS
850.0000 mg | ORAL_TABLET | Freq: Two times a day (BID) | ORAL | Status: DC
  Administered 2021-04-01 – 2021-04-04 (×7): 850 mg via ORAL
  Filled 2021-04-01 (×11): qty 1

## 2021-04-01 MED ORDER — LISINOPRIL 10 MG PO TABS
10.0000 mg | ORAL_TABLET | Freq: Every day | ORAL | Status: DC
  Administered 2021-04-01 – 2021-04-03 (×3): 10 mg via ORAL
  Filled 2021-04-01 (×3): qty 1

## 2021-04-01 MED ORDER — GABAPENTIN 100 MG PO CAPS
100.0000 mg | ORAL_CAPSULE | Freq: Three times a day (TID) | ORAL | Status: DC
  Administered 2021-04-01 – 2021-04-03 (×9): 100 mg via ORAL
  Filled 2021-04-01 (×9): qty 1

## 2021-04-01 NOTE — ED Notes (Signed)
Patient in bed asleep at this time. Patient in no distress. Patient has equal rise and fall of chest. Patient snoring.

## 2021-04-01 NOTE — Progress Notes (Signed)
Per Ardell Isaacs, patient meets criteria for inpatient treatment. There are no available or appropriate beds at [facility] today. CSW faxed referrals to the following facilities for review:  Referrals faxed to the following facilities: [facility]   [facility]   [facility]  [facility]   [facility]  [facility]   [facility]  [facility]  [facility]   [facility]   [facility]   [facility]   [facility]  [facility]  [facility]   [facility]   [facility]   [facility]   [facility]  [facility]  [facility]   [facility]   [facility]   [facility]  [facility]   [facility]   [facility]   [facility]   [facility]   [facility]    TTS will continue to seek bed placement.  Crissie Reese, MSW, LCSW-A, LCAS-A Phone: [phone] Disposition/TOC

## 2021-04-01 NOTE — ED Notes (Signed)
Pt up to restroom speaking comprehensively at this time.

## 2021-04-01 NOTE — BH Assessment (Signed)
TTS Reassessment:  Mood: Pleasant  SI/HI:  none  AVH: pt is talking to family members that are not there and pretending that her thumbs are her family members.  Pt is speaking to her hands in a very sweet, caring way and is caressing and kissing her hands.  [name] Is a 60 year old female currently at [facility] awaiting  geriatric inpatient psychiatric placement. Patient currently denies any SI/HI. Patient is responding to internal stimuli throughout assessment, and is speaking to family members that are not in the room, and speaking to her hands pretending that they are family members. Patient reports that she wants to go home. Pt reports that she is getting good rest and appetite is WNL.  Patient continues to meet inpatient criteria

## 2021-04-01 NOTE — ED Provider Notes (Signed)
Emergency Medicine Observation Re-evaluation Note  [name] is a 61 y.o. female, seen on rounds today.  Pt initially presented to the ED for suspected bipolar/manic or psychiatric episode.  The patient is under IVC Currently, the patient is resting comfortably  Physical Exam  BP 138/69 (BP Location: Right Arm)   Pulse 79   Temp 98.5 F (36.9 C) (Oral)   Resp 16   Ht 5\' 5"  (1.651 m)   Wt 69.9 kg   SpO2 99%   BMI 25.64 kg/m  Physical Exam General: NAD Cardiac: Regular HR Lungs: No respiratory distress Psych: Stable  ED Course / MDM  EKG:EKG Interpretation  Date/Time:  Friday March 31 2021 02:05:23 EDT Ventricular Rate:  68 PR Interval:  182 QRS Duration: 90 QT Interval:  414 QTC Calculation: 440 R Axis:   96 Text Interpretation: Normal sinus rhythm Rightward axis Borderline ECG Confirmed by 09-16-1990 [phone]) on 04/01/2021 6:09:44 AM  I have reviewed the labs performed to date as well as medications administered while in observation.    Plan  Current plan is for pending geriatric psych inpatient placement  Home medications reordered Patient is under full IVC at this time.   06/02/2021, MD 04/01/21 [phone]

## 2021-04-01 NOTE — ED Notes (Signed)
Patient still asleep at this time.

## 2021-04-02 MED ORDER — ONDANSETRON 4 MG PO TBDP
4.0000 mg | ORAL_TABLET | Freq: Three times a day (TID) | ORAL | Status: DC | PRN
  Administered 2021-04-02: 4 mg via ORAL
  Filled 2021-04-02: qty 1

## 2021-04-02 NOTE — BHH Counselor (Signed)
Pt remains at [facility] ED under IVC due to hallucination and possible delusion.  Pt was reassessed this morning by TTS.  Pt was easily distracted by television, and thought organization was circumstantial.  She spoke about video games, said she enjoyed watching TV.  Pt denied suicidal ideation, homcidial ideation, hallucination, and self-injurious behavior.  When asked why she was at the hospital, Pt said, ''I don't know... they think I'm crazy.''' When asked who ''they'' were, Pt responded by talking about television shows.  Recommend continued inpatient.

## 2021-04-02 NOTE — ED Notes (Signed)
TTS continues to recommend inpatient treatment at this time.

## 2021-04-02 NOTE — ED Notes (Signed)
TTS in progress at this time.  

## 2021-04-02 NOTE — ED Notes (Signed)
Pt up to shower at this time.

## 2021-04-03 LAB — RESP PANEL BY RT-PCR (FLU A&B, COVID) ARPGX2
Influenza A by PCR: NEGATIVE
Influenza B by PCR: NEGATIVE
SARS Coronavirus 2 by RT PCR: NEGATIVE

## 2021-04-03 NOTE — ED Notes (Signed)
Pt in room, sitter present, pt awaits breakfast, pt states that she is hungry, pt offers no other complaints.

## 2021-04-03 NOTE — BH Assessment (Addendum)
Disposition:   Per (Intake Nurse)  Zella Ball, RN, Patient has been accepted to [facility] [place]) for admission 04/04/2021, anytime after 7am. The acceptance is pending negative COVID results and all of the IVC paperwork faxed to [phone]. Clinician has faxed COVID results. Requesting patient's nurse Dominica Severin, RN) to fax IVC.   The accepting psychiatirst is Dr. Cherylin Mylar. Nurse report [phone]. Patient assigned to Bed #158-2.

## 2021-04-03 NOTE — BH Assessment (Signed)
Reassessment Note- Pt presents sitting on edge of chair dressed in scrubs. She smiles throughout assessment, apparently without dentures, and easily engages in conversation. Pt denies current and past SI. She denies HI, AVH, and feelings of paranoia. Pt states she is in a hospital but doesn't know why. She is oriented to year, season and current president; unaware of today's holiday, even with prompts. Given recent delusions and evidenced AVH, pt continues to be recommended for geropsychiatric inpt tx.

## 2021-04-03 NOTE — ED Provider Notes (Signed)
Patient has been accepted by [facility].  She can go to the hospital at 7 AM July 5.  The psychiatrist is excepting the patient is Dr. Nicki Guadalajara, MD 04/03/21 1751

## 2021-04-03 NOTE — ED Notes (Signed)
The patient's IVC paperwork faxed to [phone] [facility] with a job  number of [phone].

## 2021-04-03 NOTE — ED Notes (Signed)
Pt has sitter at [employer]

## 2021-04-03 NOTE — Progress Notes (Signed)
Patient was denied by [facility] due to medical complications. Patient meets inpatient criteria per Doran Heater ,NP. Patient referred to the following facilities:  [facility]  [address] [place] [address] [phone]  [facility]  [address] [place] [address] [phone]  [facility] [address]  [address], [place] [address] [phone]  [facility] [address] [place]  [address] [place] [address] [phone]  [facility]  [address] [place] [address] [phone]  [facility] [place] [address] [phone]  [facility]  [address] [place] [address] [phone]  [facility]  [address], [place] [address] [phone]  [facility]  [address] [place], [place] [address] [phone]  [facility]  [address] [place] [address] [phone]  [facility]  [address] [place] [address] [phone]  [facility]  [address] [place] [address] [phone]  [facility]  [address], [place] [address] [phone]  [facility]  [address], [place] [address] [phone]  [facility]  [address], [place] [address] [phone]  [facility]  [address], [place] [address] [phone]  [facility]  [address], [place] [address] [phone]  [facility]  [address] [place] [address] [phone]  [facility]  [address] [place] [address] [phone]  [facility]  [address] [place] [address] [phone]  [facility]  [address] [place] [address] [phone]  [facility]  [address] [place] [address] [phone]  [facility]  [address] [place], [place] [address] [phone]  [facility]  [address] [place], [place] [address] [phone]  [facility]  [address] [place] [address] [phone]  [facility]  [address] [place] [address] [phone]  [facility]  [address] [place], [place] [address] [phone]  [facility]  [address], [place] [address] [phone]  [facility]  [address] [place] [address] [phone]  [facility]  [address] [place] [address] [phone]  [facility]  [address] [place], [place] [address] [phone]  [facility]  [address], [place] [address] [phone]  [facility]  [address], [place] [address] [phone]    CSW will continue to monitor disposition.    Damita Dunnings, MSW, LCSW-A  10:00 AM 04/03/2021

## 2021-04-03 NOTE — Progress Notes (Signed)
Patient information has been sent to [facility] via secure chat to review for potential admission. Patient meets inpatient criteria per Doran Heater, NP.   Situation ongoing, CSW will continue to monitor progress.    Signed:  Damita Dunnings, MSW, LCSW-A  04/03/2021 8:25 AM

## 2021-04-03 NOTE — ED Notes (Signed)
Pt sitting in chair, pt has no requests at this time  

## 2021-04-03 NOTE — ED Notes (Signed)
Patient in shower at this time. 

## 2021-04-03 NOTE — ED Notes (Signed)
Patient resting. Will attempt vitals when awake.

## 2021-04-03 NOTE — ED Provider Notes (Signed)
Emergency Medicine Observation Re-evaluation Note  [name] is a 60 y.o. female.  Pt initially presented to the ED for complaints of IVC Currently, the patient is waiting for placement  Physical Exam  BP (!) 116/59 (BP Location: Right Arm)   Pulse 95   Temp 97.8 F (36.6 C) (Oral)   Resp 18   Ht 1.651 m (5\' 5" )   Wt 69.9 kg   SpO2 91%   BMI 25.64 kg/m  Physical Exam General: NAD Cardiac: regular rate Lungs: breathing easily Psych: nl mood  ED Course / MDM  EKG:EKG Interpretation  Date/Time:  Friday March 31 2021 02:05:23 EDT Ventricular Rate:  68 PR Interval:  182 QRS Duration: 90 QT Interval:  414 QTC Calculation: 440 R Axis:   96 Text Interpretation: Normal sinus rhythm Rightward axis Borderline ECG Confirmed by 09-16-1990 [id number]) on 04/01/2021 6:09:44 AM  I have reviewed the labs performed to date as well as medications administered while in observation.  Recent changes in the last 24 hours include re evaluated by tts.  Plan is for inpatient treatment, possible [facility].  Plan  Current plan is for inpatient treatment. Patient is under full IVC at this time.   06/02/2021, MD 04/03/21 [phone]

## 2021-04-03 NOTE — BH Assessment (Addendum)
Disposition:   Nursing Jeanella Anton, NP) confirmed that the IVC papers were faxed to [facility].   Clinician faxed negative COVID results completed previously. In addition, to today's negative COVID results.   Patient remains accepted to [facility] for admission 04/04/2021.

## 2021-04-04 MED ORDER — ZIPRASIDONE MESYLATE 20 MG IM SOLR
20.0000 mg | Freq: Once | INTRAMUSCULAR | Status: AC
  Administered 2021-04-04: 20 mg via INTRAMUSCULAR
  Filled 2021-04-04: qty 20

## 2021-04-04 MED ORDER — STERILE WATER FOR INJECTION IJ SOLN
INTRAMUSCULAR | Status: AC
  Filled 2021-04-04: qty 10

## 2021-04-04 NOTE — ED Notes (Signed)
Pt is being very uncorporative and not easily redirected. Pt is pulling clothes off, not staying in her room and sitting in the middle of the hallway. Pt is very restless. EDP made aware and awaiting orders.

## 2021-04-04 NOTE — ED Notes (Signed)
Attempted to call report to [facility] no answer

## 2021-04-04 NOTE — ED Notes (Signed)
Pt ambulated to restroom. 

## 2021-04-04 NOTE — ED Notes (Signed)
Attempted to call report-no answer 

## 2021-04-07 ENCOUNTER — Encounter: Payer: Medicaid Other | Admitting: Podiatry

## 2021-04-13 ENCOUNTER — Telehealth: Payer: Self-pay | Admitting: *Deleted

## 2021-04-13 NOTE — Telephone Encounter (Signed)
Patient's son is calling to reschedule mom's appointment from (07/08). Please call back to reschedule.

## 2021-04-21 ENCOUNTER — Ambulatory Visit (INDEPENDENT_AMBULATORY_CARE_PROVIDER_SITE_OTHER): Payer: Medicaid Other | Admitting: Podiatry

## 2021-04-21 ENCOUNTER — Other Ambulatory Visit: Payer: Self-pay

## 2021-04-21 DIAGNOSIS — Z89422 Acquired absence of other left toe(s): Secondary | ICD-10-CM

## 2021-04-21 DIAGNOSIS — Z9889 Other specified postprocedural states: Secondary | ICD-10-CM

## 2021-04-21 MED ORDER — OXYCODONE-ACETAMINOPHEN 5-325 MG PO TABS: 1.0000 | ORAL_TABLET | ORAL | 0 refills | Status: DC | PRN

## 2021-04-21 NOTE — Progress Notes (Signed)
Subjective:  Patient ID: [name], female    DOB: 03-20-1961,  MRN: [id number]  Chief Complaint  Patient presents with   Routine Post Op    Post op dos 6.12.22    DOS: 03/12/2021 Procedure: Left incision and drainage with partial second ray amputation  60 y.o. female returns for post-op check.  Patient states she is doing well.  She denies any other acute complaints.  She has been ambulating surgical shoe.  She was lost to follow-up for few weeks..  Review of Systems: Negative except as noted in the HPI. Denies N/V/F/Ch.  Past Medical History:  Diagnosis Date   Diabetes mellitus without complication ([place])    Hidradenitis 12/13/2015   Hypercholesterolemia    Hypertension    Smoker 12/13/2015    Current Outpatient Medications:    oxyCODONE-acetaminophen (PERCOCET) 5-325 MG tablet, Take 1-2 tablets by mouth every 4 (four) hours as needed for severe pain., Disp: 30 tablet, Rfl: 0   ACCU-CHEK GUIDE test strip, USE TO TEST 3 TIMESDDAILY., Disp: , Rfl:    Accu-Chek Softclix Lancets lancets, 3 (three) times daily., Disp: , Rfl:    ascorbic acid (VITAMIN C) 500 MG tablet, Take 1 tablet (500 mg total) by mouth daily., Disp: 30 tablet, Rfl: 0   aspirin 325 MG tablet, Take 325 mg by mouth every 4 (four) hours as needed. pain, Disp: , Rfl:    atorvastatin (LIPITOR) 40 MG tablet, Take 1 tablet by mouth daily., Disp: , Rfl:    blood glucose meter kit and supplies, Dispense based on patient and insurance preference. Use up to four times daily as directed. (FOR ICD-10 E10.9, E11.9)., Disp: 1 each, Rfl: 0   cholecalciferol (VITAMIN D3) 25 MCG (1000 UNIT) tablet, Take 1,000 Units by mouth daily., Disp: , Rfl:    diphenhydrAMINE HCl (BENADRYL PO), Take 25 mg by mouth every 6 (six) hours as needed (itching)., Disp: , Rfl:    doxycycline (VIBRA-TABS) 100 MG tablet, Take 100 mg by mouth 2 (two) times daily., Disp: , Rfl:    gabapentin (NEURONTIN) 100 MG capsule, Take 1 capsule (100 mg total) by  mouth 3 (three) times daily., Disp: 90 capsule, Rfl: 3   glipiZIDE (GLUCOTROL) 5 MG tablet, Take 5 mg by mouth 2 (two) times daily., Disp: , Rfl:    insulin glargine (LANTUS) 100 UNIT/ML Solostar Pen, Inject 10 Units into the skin at bedtime., Disp: 15 mL, Rfl: 1   insulin lispro (HUMALOG) 100 UNIT/ML KwikPen, Inject 0.03 mLs (3 Units total) into the skin with breakfast, with lunch, and with evening meal., Disp: 15 mL, Rfl: 1   Insulin Pen Needle 31G X 5 MM MISC, 1 Device by Does not apply route as directed., Disp: 100 each, Rfl: 1   lisinopril-hydrochlorothiazide (PRINZIDE,ZESTORETIC) 10-12.5 MG tablet, Take 1 tablet by mouth daily., Disp: , Rfl:    metFORMIN (GLUCOPHAGE) 850 MG tablet, Take 850 mg by mouth 2 (two) times daily., Disp: , Rfl:    oxybutynin (DITROPAN-XL) 10 MG 24 hr tablet, Take 1 tablet (10 mg total) by mouth at bedtime., Disp: 30 tablet, Rfl: 3   oxyCODONE-acetaminophen (PERCOCET) 5-325 MG tablet, Take 1-2 tablets by mouth every 4 (four) hours as needed for severe pain. (Patient not taking: Reported on 03/31/2021), Disp: 30 tablet, Rfl: 0   oxyCODONE-acetaminophen (PERCOCET) 5-325 MG tablet, Take 1-2 tablets by mouth every 4 (four) hours as needed for severe pain., Disp: 30 tablet, Rfl: 0   PROAIR HFA 108 (90 Base) MCG/ACT inhaler, Inhale  2 puffs into the lungs every 4 (four) hours as needed for wheezing., Disp: , Rfl:   Social History   Tobacco Use  Smoking Status Every Day   Packs/day: 0.50   Years: 34.00   Pack years: 17.00   Types: Cigarettes  Smokeless Tobacco Never    No Known Allergies Objective:  There were no vitals filed for this visit. There is no height or weight on file to calculate BMI. Constitutional Well developed. Well nourished.  Vascular Foot warm and well perfused. Capillary refill normal to all digits.   Neurologic Normal speech. Oriented to person, place, and time. Epicritic sensation to light touch grossly present bilaterally.  Dermatologic  Skin mostly be epithelialized.  Mild plantar superficial dehiscence noted.  Orthopedic: Tenderness to palpation noted about the surgical site.   Radiographs: None Assessment:   1. 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 Status post foot surgery     Plan:  Patient was evaluated and treated and all questions answered.  S/p foot surgery left -Progressing as expected post-operatively. -XR: None -WB Status: Weight-bear as tolerated in [place] wedge shoe -Sutures: Removed mild dehiscence signs of dehiscence noted no complication noted. -Medications: None -Continue Betadine wet-to-dry dressing change incision until completely epithelialized.  Mild superficial dehiscence noted plantar foot.  Continue Betadine wet-to-dry  No follow-ups on file.

## 2021-05-02 ENCOUNTER — Telehealth: Payer: Self-pay | Admitting: Podiatry

## 2021-05-02 ENCOUNTER — Other Ambulatory Visit: Payer: Self-pay | Admitting: Podiatry

## 2021-05-02 MED ORDER — OXYCODONE-ACETAMINOPHEN 5-325 MG PO TABS: 1.0000 | ORAL_TABLET | ORAL | 0 refills | Status: DC | PRN

## 2021-05-02 NOTE — Telephone Encounter (Signed)
Pt's son called requesting refill on medication. Please advise.

## 2021-05-15 ENCOUNTER — Other Ambulatory Visit: Payer: Self-pay | Admitting: Podiatry

## 2021-05-16 NOTE — Telephone Encounter (Signed)
Please advise 

## 2021-05-19 ENCOUNTER — Encounter: Payer: Medicaid Other | Admitting: Podiatry

## 2021-05-20 ENCOUNTER — Other Ambulatory Visit: Payer: Self-pay

## 2021-05-20 ENCOUNTER — Emergency Department (HOSPITAL_COMMUNITY): Payer: Medicaid Other

## 2021-05-20 ENCOUNTER — Inpatient Hospital Stay (HOSPITAL_COMMUNITY)
Admission: EM | Admit: 2021-05-20 | Discharge: 2021-05-30 | DRG: 603 | Disposition: A | Discharge status: Skilled Nursing Facility | Payer: Medicaid Other | Attending: Internal Medicine | Admitting: Internal Medicine

## 2021-05-20 ENCOUNTER — Encounter (HOSPITAL_COMMUNITY): Payer: Self-pay

## 2021-05-20 DIAGNOSIS — Z720 Tobacco use: Secondary | ICD-10-CM | POA: Diagnosis present

## 2021-05-20 DIAGNOSIS — I10 Essential (primary) hypertension: Secondary | ICD-10-CM | POA: Diagnosis present

## 2021-05-20 DIAGNOSIS — E7800 Pure hypercholesterolemia, unspecified: Secondary | ICD-10-CM | POA: Diagnosis present

## 2021-05-20 DIAGNOSIS — L97509 Non-pressure chronic ulcer of other part of unspecified foot with unspecified severity: Secondary | ICD-10-CM | POA: Diagnosis not present

## 2021-05-20 DIAGNOSIS — E11621 Type 2 diabetes mellitus with foot ulcer: Secondary | ICD-10-CM | POA: Diagnosis not present

## 2021-05-20 DIAGNOSIS — Z7984 Long term (current) use of oral hypoglycemic drugs: Secondary | ICD-10-CM | POA: Diagnosis not present

## 2021-05-20 DIAGNOSIS — S92352A Displaced fracture of fifth metatarsal bone, left foot, initial encounter for closed fracture: Secondary | ICD-10-CM | POA: Diagnosis present

## 2021-05-20 DIAGNOSIS — Z79899 Other long term (current) drug therapy: Secondary | ICD-10-CM

## 2021-05-20 DIAGNOSIS — S92342A Displaced fracture of fourth metatarsal bone, left foot, initial encounter for closed fracture: Secondary | ICD-10-CM | POA: Diagnosis present

## 2021-05-20 DIAGNOSIS — Z8249 Family history of ischemic heart disease and other diseases of the circulatory system: Secondary | ICD-10-CM

## 2021-05-20 DIAGNOSIS — M7989 Other specified soft tissue disorders: Secondary | ICD-10-CM | POA: Diagnosis not present

## 2021-05-20 DIAGNOSIS — S92322A Displaced fracture of second metatarsal bone, left foot, initial encounter for closed fracture: Secondary | ICD-10-CM | POA: Diagnosis present

## 2021-05-20 DIAGNOSIS — E785 Hyperlipidemia, unspecified: Secondary | ICD-10-CM | POA: Diagnosis not present

## 2021-05-20 DIAGNOSIS — L03116 Cellulitis of left lower limb: Secondary | ICD-10-CM | POA: Diagnosis present

## 2021-05-20 DIAGNOSIS — X58XXXA Exposure to other specified factors, initial encounter: Secondary | ICD-10-CM | POA: Diagnosis present

## 2021-05-20 DIAGNOSIS — M14672 Charcot's joint, left ankle and foot: Secondary | ICD-10-CM | POA: Diagnosis not present

## 2021-05-20 DIAGNOSIS — R609 Edema, unspecified: Secondary | ICD-10-CM

## 2021-05-20 DIAGNOSIS — F319 Bipolar disorder, unspecified: Secondary | ICD-10-CM | POA: Diagnosis present

## 2021-05-20 DIAGNOSIS — L0390 Cellulitis, unspecified: Secondary | ICD-10-CM

## 2021-05-20 DIAGNOSIS — Z20822 Contact with and (suspected) exposure to covid-19: Secondary | ICD-10-CM | POA: Diagnosis present

## 2021-05-20 DIAGNOSIS — F17210 Nicotine dependence, cigarettes, uncomplicated: Secondary | ICD-10-CM | POA: Diagnosis present

## 2021-05-20 DIAGNOSIS — Z833 Family history of diabetes mellitus: Secondary | ICD-10-CM

## 2021-05-20 DIAGNOSIS — Z83438 Family history of other disorder of lipoprotein metabolism and other lipidemia: Secondary | ICD-10-CM | POA: Diagnosis not present

## 2021-05-20 DIAGNOSIS — S92332A Displaced fracture of third metatarsal bone, left foot, initial encounter for closed fracture: Secondary | ICD-10-CM | POA: Diagnosis present

## 2021-05-20 DIAGNOSIS — S92312A Displaced fracture of first metatarsal bone, left foot, initial encounter for closed fracture: Secondary | ICD-10-CM | POA: Diagnosis present

## 2021-05-20 DIAGNOSIS — E119 Type 2 diabetes mellitus without complications: Secondary | ICD-10-CM

## 2021-05-20 DIAGNOSIS — Z794 Long term (current) use of insulin: Secondary | ICD-10-CM

## 2021-05-20 DIAGNOSIS — E1165 Type 2 diabetes mellitus with hyperglycemia: Secondary | ICD-10-CM | POA: Diagnosis present

## 2021-05-20 DIAGNOSIS — S92902A Unspecified fracture of left foot, initial encounter for closed fracture: Secondary | ICD-10-CM | POA: Diagnosis not present

## 2021-05-20 LAB — CBC WITH DIFFERENTIAL/PLATELET
Abs Immature Granulocytes: 0.02 10*3/uL (ref 0.00–0.07)
Basophils Absolute: 0 10*3/uL (ref 0.0–0.1)
Basophils Relative: 0 %
Eosinophils Absolute: 0.1 10*3/uL (ref 0.0–0.5)
Eosinophils Relative: 1 %
HCT: 43.9 % (ref 36.0–46.0)
Hemoglobin: 14.7 g/dL (ref 12.0–15.0)
Immature Granulocytes: 0 %
Lymphocytes Relative: 37 %
Lymphs Abs: 1.8 10*3/uL (ref 0.7–4.0)
MCH: 30.8 pg (ref 26.0–34.0)
MCHC: 33.5 g/dL (ref 30.0–36.0)
MCV: 92 fL (ref 80.0–100.0)
Monocytes Absolute: 0.4 10*3/uL (ref 0.1–1.0)
Monocytes Relative: 7 %
Neutro Abs: 2.6 10*3/uL (ref 1.7–7.7)
Neutrophils Relative %: 55 %
Platelets: 216 10*3/uL (ref 150–400)
RBC: 4.77 MIL/uL (ref 3.87–5.11)
RDW: 12.8 % (ref 11.5–15.5)
WBC: 4.8 10*3/uL (ref 4.0–10.5)
nRBC: 0 % (ref 0.0–0.2)

## 2021-05-20 LAB — COMPREHENSIVE METABOLIC PANEL
ALT: 16 U/L (ref 0–44)
AST: 14 U/L — ABNORMAL LOW (ref 15–41)
Albumin: 3.1 g/dL — ABNORMAL LOW (ref 3.5–5.0)
Alkaline Phosphatase: 173 U/L — ABNORMAL HIGH (ref 38–126)
Anion gap: 7 (ref 5–15)
BUN: 10 mg/dL (ref 6–20)
CO2: 29 mmol/L (ref 22–32)
Calcium: 8.5 mg/dL — ABNORMAL LOW (ref 8.9–10.3)
Chloride: 99 mmol/L (ref 98–111)
Creatinine, Ser: 0.78 mg/dL (ref 0.44–1.00)
GFR, Estimated: >60 mL/min (ref >60)
Glucose, Bld: 168 mg/dL — ABNORMAL HIGH (ref 70–99)
Potassium: 3.6 mmol/L (ref 3.5–5.1)
Sodium: 135 mmol/L (ref 135–145)
Total Bilirubin: 0.2 mg/dL — ABNORMAL LOW (ref 0.3–1.2)
Total Protein: 6.7 g/dL (ref 6.5–8.1)

## 2021-05-20 LAB — CBG MONITORING, ED: Glucose-Capillary: 211 mg/dL — ABNORMAL HIGH (ref 70–99)

## 2021-05-20 LAB — RESP PANEL BY RT-PCR (FLU A&B, COVID) ARPGX2
Influenza A by PCR: NEGATIVE
Influenza B by PCR: NEGATIVE
SARS Coronavirus 2 by RT PCR: NEGATIVE

## 2021-05-20 LAB — C-REACTIVE PROTEIN: CRP: 9.4 mg/dL — ABNORMAL HIGH (ref <1.0)

## 2021-05-20 MED ORDER — ALBUTEROL SULFATE (2.5 MG/3ML) 0.083% IN NEBU: 2.5000 mg | INHALATION_SOLUTION | RESPIRATORY_TRACT | Status: DC | PRN

## 2021-05-20 MED ORDER — ACETAMINOPHEN 325 MG PO TABS
650.0000 mg | ORAL_TABLET | Freq: Four times a day (QID) | ORAL | Status: DC | PRN
  Administered 2021-05-22 – 2021-05-28 (×4): 650 mg via ORAL
  Filled 2021-05-20 (×5): qty 2

## 2021-05-20 MED ORDER — LISINOPRIL-HYDROCHLOROTHIAZIDE 10-12.5 MG PO TABS: 1.0000 | ORAL_TABLET | Freq: Every day | ORAL | Status: DC

## 2021-05-20 MED ORDER — INSULIN ASPART 100 UNIT/ML IJ SOLN
0.0000 [IU] | Freq: Every day | INTRAMUSCULAR | Status: DC
  Administered 2021-05-22 – 2021-05-24 (×2): 3 [IU] via SUBCUTANEOUS
  Administered 2021-05-25: 2 [IU] via SUBCUTANEOUS

## 2021-05-20 MED ORDER — ATORVASTATIN CALCIUM 40 MG PO TABS
40.0000 mg | ORAL_TABLET | Freq: Every day | ORAL | Status: DC
  Administered 2021-05-21 – 2021-05-30 (×10): 40 mg via ORAL
  Filled 2021-05-20 (×10): qty 1

## 2021-05-20 MED ORDER — PIPERACILLIN-TAZOBACTAM 3.375 G IVPB 30 MIN
3.3750 g | Freq: Once | INTRAVENOUS | Status: AC
  Administered 2021-05-20: 3.375 g via INTRAVENOUS
  Filled 2021-05-20: qty 50

## 2021-05-20 MED ORDER — METRONIDAZOLE 500 MG/100ML IV SOLN
500.0000 mg | Freq: Two times a day (BID) | INTRAVENOUS | Status: AC
  Administered 2021-05-21 – 2021-05-27 (×14): 500 mg via INTRAVENOUS
  Filled 2021-05-20 (×14): qty 100

## 2021-05-20 MED ORDER — VANCOMYCIN HCL IN DEXTROSE 1-5 GM/200ML-% IV SOLN
1000.0000 mg | Freq: Once | INTRAVENOUS | Status: AC
  Administered 2021-05-20: 1000 mg via INTRAVENOUS
  Filled 2021-05-20: qty 200

## 2021-05-20 MED ORDER — INSULIN GLARGINE-YFGN 100 UNIT/ML ~~LOC~~ SOLN
10.0000 [IU] | Freq: Every day | SUBCUTANEOUS | Status: DC
  Administered 2021-05-21 – 2021-05-25 (×6): 10 [IU] via SUBCUTANEOUS
  Filled 2021-05-20 (×9): qty 0.1

## 2021-05-20 MED ORDER — OXYCODONE HCL 5 MG PO TABS
5.0000 mg | ORAL_TABLET | ORAL | Status: DC | PRN
Start: 2021-05-20 — End: 2021-05-30
  Administered 2021-05-22: 5 mg via ORAL
  Filled 2021-05-20 (×2): qty 1

## 2021-05-20 MED ORDER — MORPHINE SULFATE (PF) 2 MG/ML IV SOLN
2.0000 mg | INTRAVENOUS | Status: DC | PRN
Start: 2021-05-20 — End: 2021-05-30

## 2021-05-20 MED ORDER — INSULIN ASPART 100 UNIT/ML IJ SOLN
0.0000 [IU] | Freq: Three times a day (TID) | INTRAMUSCULAR | Status: DC
  Administered 2021-05-21: 2 [IU] via SUBCUTANEOUS
  Administered 2021-05-21 (×2): 3 [IU] via SUBCUTANEOUS
  Administered 2021-05-22: 5 [IU] via SUBCUTANEOUS
  Administered 2021-05-22: 2 [IU] via SUBCUTANEOUS
  Administered 2021-05-22: 3 [IU] via SUBCUTANEOUS
  Administered 2021-05-23: 5 [IU] via SUBCUTANEOUS
  Administered 2021-05-23: 3 [IU] via SUBCUTANEOUS
  Administered 2021-05-24 (×2): 2 [IU] via SUBCUTANEOUS
  Administered 2021-05-24: 5 [IU] via SUBCUTANEOUS
  Administered 2021-05-25: 3 [IU] via SUBCUTANEOUS
  Administered 2021-05-25: 2 [IU] via SUBCUTANEOUS
  Administered 2021-05-25: 3 [IU] via SUBCUTANEOUS
  Administered 2021-05-26: 11 [IU] via SUBCUTANEOUS
  Administered 2021-05-26: 3 [IU] via SUBCUTANEOUS
  Filled 2021-05-20 (×2): qty 1

## 2021-05-20 MED ORDER — CEFTRIAXONE SODIUM 2 G IJ SOLR
2.0000 g | INTRAMUSCULAR | Status: AC
  Administered 2021-05-21 – 2021-05-27 (×7): 2 g via INTRAVENOUS
  Filled 2021-05-20 (×7): qty 20

## 2021-05-20 MED ORDER — OXYCODONE-ACETAMINOPHEN 5-325 MG PO TABS
1.0000 | ORAL_TABLET | ORAL | Status: DC | PRN
  Administered 2021-05-23 – 2021-05-29 (×2): 2 via ORAL
  Filled 2021-05-20 (×2): qty 2

## 2021-05-20 MED ORDER — ACETAMINOPHEN 650 MG RE SUPP: 650.0000 mg | Freq: Four times a day (QID) | RECTAL | Status: DC | PRN

## 2021-05-20 MED ORDER — INSULIN GLARGINE 100 UNIT/ML SOLOSTAR PEN: 10.0000 [IU] | PEN_INJECTOR | Freq: Every day | SUBCUTANEOUS | Status: DC

## 2021-05-20 MED ORDER — HEPARIN SODIUM (PORCINE) 5000 UNIT/ML IJ SOLN
5000.0000 [IU] | Freq: Three times a day (TID) | INTRAMUSCULAR | Status: DC
  Administered 2021-05-21 – 2021-05-22 (×3): 5000 [IU] via SUBCUTANEOUS
  Filled 2021-05-20 (×4): qty 1

## 2021-05-20 MED ORDER — OXYBUTYNIN CHLORIDE ER 5 MG PO TB24
10.0000 mg | ORAL_TABLET | Freq: Every day | ORAL | Status: DC
  Administered 2021-05-20 – 2021-05-29 (×10): 10 mg via ORAL
  Filled 2021-05-20 (×10): qty 2

## 2021-05-20 MED ORDER — HEPARIN SODIUM (PORCINE) 5000 UNIT/ML IJ SOLN: 5000.0000 [IU] | Freq: Three times a day (TID) | INTRAMUSCULAR | Status: DC

## 2021-05-20 MED ORDER — SODIUM CHLORIDE 0.9 % IV SOLN: INTRAVENOUS | Status: DC

## 2021-05-20 MED ORDER — ONDANSETRON HCL 4 MG/2ML IJ SOLN: 4.0000 mg | Freq: Four times a day (QID) | INTRAMUSCULAR | Status: DC | PRN

## 2021-05-20 MED ORDER — ALBUTEROL SULFATE HFA 108 (90 BASE) MCG/ACT IN AERS: 2.0000 | INHALATION_SPRAY | RESPIRATORY_TRACT | Status: DC | PRN

## 2021-05-20 MED ORDER — GABAPENTIN 100 MG PO CAPS
100.0000 mg | ORAL_CAPSULE | Freq: Three times a day (TID) | ORAL | Status: DC
  Administered 2021-05-20 – 2021-05-30 (×29): 100 mg via ORAL
  Filled 2021-05-20 (×29): qty 1

## 2021-05-20 MED ORDER — ONDANSETRON HCL 4 MG PO TABS: 4.0000 mg | ORAL_TABLET | Freq: Four times a day (QID) | ORAL | Status: DC | PRN

## 2021-05-20 MED ORDER — NICOTINE 21 MG/24HR TD PT24
21.0000 mg | MEDICATED_PATCH | Freq: Every day | TRANSDERMAL | Status: DC
  Administered 2021-05-22 – 2021-05-30 (×7): 21 mg via TRANSDERMAL
  Filled 2021-05-20 (×10): qty 1

## 2021-05-20 NOTE — ED Notes (Signed)
Family in room with patient

## 2021-05-20 NOTE — ED Notes (Signed)
100mg  Gabapentin and 10mg  Ditropan XL given at 2305- unable to scan at this time

## 2021-05-20 NOTE — H&P (Addendum)
TRH H&P    Patient Demographics:    [name], is a 60 y.o. female  MRN: [id number]  DOB - [birth year]  Admit Date - 05/20/2021  Referring MD/NP/PA: Rogene Houston  Outpatient Primary MD for the patient is Rosita Fire, MD  Patient coming from: Home  Chief complaint- Cellulitis   HPI:    [name]  is a 60 y.o. female, with history of DMII, HLD, HTN, Tobacco abuse and more presents to the ED with a chief complaint of left foot swelling. Patient reports that she is not sure when the swelling started but she thinks it was 2 days ago.  She reports that it started as minimal swelling and has progressed since then.  She reports that it is also been erythematous for 2 days.  She reports a severe hurt, that is worse with weightbearing.  She denies trauma.  She has had episodes of nausea and vomiting 3 or 4 times a day.  She reports that that is because she eats [place] food and she was throws up when she eats [place] food.  She is been monitoring her sugars at home and they run 98-200.  She is not sure what medications or how much she takes, but does know she uses pills and shots for her diabetes.  Patient is a current smoker and she smokes a pack a day.  She drinks alcohol, 1 beer once in a while per her report.  She does not use illicit drugs.  She is vaccinated for COVID.  Patient is full code.  In the ED Temp 98.6, heart rate 69-96, respiratory rate 18-24, blood pressure 144/84, satting at 93 to 100% White blood cell count 4.8, hemoglobin 14.7 Chemistry panel shows hyperglycemia at 168 Alk phos 173 Albumin 3.1 Negative COVID Blood cultures pending X-ray shows Lisfranc dislocation-although patient denies any trauma. EKG shows a heart rate 71, sinus rhythm, QTC 447 Patient was started on vancomycin and Zosyn as well as normal saline 75 mill per hour EDP spoke with ortho who rec's splinting and non emergent  consult with ortho here in [place]     Review of systems:    In addition to the HPI above,  No Fever-chills, No Headache, No changes with Vision or hearing, No problems swallowing food or Liquids, No Chest pain, Cough or Shortness of Breath, No Abdominal pain, admits to nausea and vomiting, bowel movements are regular, No Blood in stool or Urine, No dysuria, No new skin rashes or bruises, No new weakness, tingling, numbness in any extremity, No recent weight gain or loss, No polyuria, polydypsia or polyphagia, No significant Mental Stressors.  All other systems reviewed and are negative.    Past History of the following :    Past Medical History:  Diagnosis Date   Diabetes mellitus without complication ([place])    Hidradenitis 12/13/2015   Hypercholesterolemia    Hypertension    Smoker 12/13/2015      Past Surgical History:  Procedure Laterality Date   FLEXIBLE SIGMOIDOSCOPY N/A 09/30/2017   Procedure: FLEXIBLE SIGMOIDOSCOPY;  Surgeon:  Fields, Marga Melnick, MD;  [facility];  Service: Endoscopy;  Laterality: N/A;   I & D EXTREMITY Left 03/12/2021   Procedure: 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;  Surgeon: Felipa Furnace, DPM;  Location: [facility];  Service: Podiatry;  Laterality: Left;      Social History:      Social History   Tobacco Use   Smoking status: Every Day    Packs/day: 0.50    Years: 34.00    Pack years: 17.00    Types: Cigarettes   Smokeless tobacco: Never  Substance Use Topics   Alcohol use: No       Family History :     Family History  Problem Relation Age of Onset   Asthma Mother    Cancer Mother    Hypertension Mother    Diabetes Mother    Hypertension Father    Diabetes Father    Other Brother        hit by car   Breast cancer Sister    Stroke Brother    Hypertension Maternal Grandmother    Diabetes Maternal Grandmother    Hyperlipidemia Maternal Grandmother    Hypertension Maternal Grandfather     Hyperlipidemia Maternal Grandfather    Hypertension Paternal Grandmother    Hyperlipidemia Paternal Grandmother    Hypertension Paternal Grandfather    Hyperlipidemia Paternal Grandfather       Home Medications:   Prior to Admission medications   Medication Sig Start Date End Date Taking? Authorizing Provider  ACCU-CHEK GUIDE test strip USE TO TEST 3 TIMESDDAILY. 03/30/20   [provider]  Accu-Chek Softclix Lancets lancets 3 (three) times daily. 03/30/20   [provider]  ascorbic acid (VITAMIN C) 500 MG tablet Take 1 tablet (500 mg total) by mouth daily. 03/12/21   Patrecia Pour, MD  aspirin 325 MG tablet Take 325 mg by mouth every 4 (four) hours as needed. pain    [provider]  atorvastatin (LIPITOR) 40 MG tablet Take 1 tablet by mouth daily. 10/05/20   [provider]  blood glucose meter kit and supplies Dispense based on patient and insurance preference. Use up to four times daily as directed. (FOR ICD-10 E10.9, E11.9). 03/28/20   Johnson, Clanford L, MD  cholecalciferol (VITAMIN D3) 25 MCG (1000 UNIT) tablet Take 1,000 Units by mouth daily.    [provider]  diphenhydrAMINE HCl (BENADRYL PO) Take 25 mg by mouth every 6 (six) hours as needed (itching).    [provider]  doxycycline (VIBRA-TABS) 100 MG tablet Take 100 mg by mouth 2 (two) times daily. 03/12/21   [provider]  gabapentin (NEURONTIN) 100 MG capsule Take 1 capsule (100 mg total) by mouth 3 (three) times daily. 04/22/20   Felipa Furnace, DPM  glipiZIDE (GLUCOTROL) 5 MG tablet Take 5 mg by mouth 2 (two) times daily. 12/06/20   [provider]  insulin glargine (LANTUS) 100 UNIT/ML Solostar Pen Inject 10 Units into the skin at bedtime. 03/28/20   Johnson, Clanford L, MD  insulin lispro (HUMALOG) 100 UNIT/ML KwikPen Inject 0.03 mLs (3 Units total) into the skin with breakfast, with lunch, and with evening meal. 03/28/20   Johnson, Clanford L, MD  Insulin  Pen Needle 31G X 5 MM MISC 1 Device by Does not apply route as directed. 03/28/20   Johnson, Clanford L, MD  lisinopril-hydrochlorothiazide (PRINZIDE,ZESTORETIC) 10-12.5 MG tablet Take 1 tablet by mouth daily.    [provider]  metFORMIN (GLUCOPHAGE) 850 MG tablet Take 850 mg by mouth 2 (two) times daily. 01/25/21   [provider]  oxybutynin (DITROPAN-XL) 10 MG 24 hr tablet Take 1 tablet (10 mg total) by mouth at bedtime. 02/02/21   Estill Dooms, NP  oxyCODONE-acetaminophen (PERCOCET) 5-325 MG tablet Take 1-2 tablets by mouth every 4 (four) hours as needed for severe pain. Patient not taking: Reported on 03/31/2021 03/12/21   Felipa Furnace, DPM  oxyCODONE-acetaminophen (PERCOCET) 5-325 MG tablet Take 1-2 tablets by mouth every 4 (four) hours as needed for severe pain. 03/20/21   Felipa Furnace, DPM  oxyCODONE-acetaminophen (PERCOCET) 5-325 MG tablet Take 1-2 tablets by mouth every 4 (four) hours as needed for severe pain. 04/21/21   Felipa Furnace, DPM  oxyCODONE-acetaminophen (PERCOCET) 5-325 MG tablet Take 1-2 tablets by mouth every 4 (four) hours as needed for severe pain. 05/02/21   Felipa Furnace, DPM  PROAIR HFA 108 (90 Base) MCG/ACT inhaler Inhale 2 puffs into the lungs every 4 (four) hours as needed for wheezing. 12/06/20   [provider]     Allergies:    No Known Allergies   Physical Exam:   Vitals  Blood pressure (!) 144/84, pulse 80, temperature 98.6 F (37 C), temperature source Oral, resp. rate 20, height _0  (1.651 m), weight 72.6 kg, SpO2 93 %.  1.  General: Patient lying supine in bed,  no acute distress   2. Psychiatric: Alert and oriented x 3, mood and behavior normal for situation, pleasant and cooperative with exam   3. Neurologic: Speech and language are normal, face is symmetric, moves all 4 extremities voluntarily, at baseline without acute deficits on limited exam   4. HEENMT:  Head is atraumatic, normocephalic, pupils reactive to  light, neck is supple, trachea is midline, mucous membranes are moist   5. Respiratory : Lungs are clear to auscultation bilaterally without wheezing, rhonchi, rales, no cyanosis, no increase in work of breathing or accessory muscle use   6. Cardiovascular : Heart rate normal, rhythm is regular, no murmurs, rubs or gallops, no peripheral edema, peripheral pulses palpated   7. Gastrointestinal:  Abdomen is soft, nondistended, nontender to palpation bowel sounds active, no masses or organomegaly palpated   8. Skin:  Skin is warm, dry and intact with erythema starting the distal left lower extremity and reaching past the ankle   9.Musculoskeletal:  Edema and tenderness to palpation of the left foot, no asymmetry in tone, no peripheral pitting edema, peripheral pulses palpated, no tenderness to palpation in the extremities     Data Review:    CBC Recent Labs  Lab 05/20/21 2046  WBC 4.8  HGB 14.7  HCT 43.9  PLT 216  MCV 92.0  MCH 30.8  MCHC 33.5  RDW 12.8  LYMPHSABS 1.8  MONOABS 0.4  EOSABS 0.1  BASOSABS 0.0   ------------------------------------------------------------------------------------------------------------------  Results for orders placed or performed during the hospital encounter of 05/20/21 (from the past 48 hour(s))  CBG monitoring, ED     Status: Abnormal   Collection Time: 05/20/21  8:45 PM  Result Value Ref Range   Glucose-Capillary 211 (H) 70 - 99 mg/dL    Comment: Glucose reference range applies only to samples taken after fasting for at least 8 hours.  CBC with Differential/Platelet     Status: None   Collection Time: 05/20/21  8:46 PM  Result Value Ref Range   WBC 4.8 4.0 - 10.5 K/uL   RBC  4.77 3.87 - 5.11 MIL/uL   Hemoglobin 14.7 12.0 - 15.0 g/dL   HCT 43.9 36.0 - 46.0 %   MCV 92.0 80.0 - 100.0 fL   MCH 30.8 26.0 - 34.0 pg   MCHC 33.5 30.0 - 36.0 g/dL   RDW 12.8 11.5 - 15.5 %   Platelets 216 150 - 400 K/uL   nRBC 0.0 0.0 - 0.2 %    Neutrophils Relative % 55 %   Neutro Abs 2.6 1.7 - 7.7 K/uL   Lymphocytes Relative 37 %   Lymphs Abs 1.8 0.7 - 4.0 K/uL   Monocytes Relative 7 %   Monocytes Absolute 0.4 0.1 - 1.0 K/uL   Eosinophils Relative 1 %   Eosinophils Absolute 0.1 0.0 - 0.5 K/uL   Basophils Relative 0 %   Basophils Absolute 0.0 0.0 - 0.1 K/uL   Immature Granulocytes 0 %   Abs Immature Granulocytes 0.02 0.00 - 0.07 K/uL    Comment: Performed at [facility], [address] [place], [place] [address]  Comprehensive metabolic panel     Status: Abnormal   Collection Time: 05/20/21  8:46 PM  Result Value Ref Range   Sodium 135 135 - 145 mmol/L   Potassium 3.6 3.5 - 5.1 mmol/L   Chloride 99 98 - 111 mmol/L   CO2 29 22 - 32 mmol/L   Glucose, Bld 168 (H) 70 - 99 mg/dL    Comment: Glucose reference range applies only to samples taken after fasting for at least 8 hours.   BUN 10 6 - 20 mg/dL   Creatinine, Ser 0.78 0.44 - 1.00 mg/dL   Calcium 8.5 (L) 8.9 - 10.3 mg/dL   Total Protein 6.7 6.5 - 8.1 g/dL   Albumin 3.1 (L) 3.5 - 5.0 g/dL   AST 14 (L) 15 - 41 U/L   ALT 16 0 - 44 U/L   Alkaline Phosphatase 173 (H) 38 - 126 U/L   Total Bilirubin 0.2 (L) 0.3 - 1.2 mg/dL   GFR, Estimated >60 >60 mL/min    Comment: (NOTE) Calculated using the CKD-EPI Creatinine Equation (2021)    Anion gap 7 5 - 15    Comment: Performed at [facility], [address] [place], [place] [address]  Resp Panel by RT-PCR (Flu A&B, Covid) Nasopharyngeal Swab     Status: None   Collection Time: 05/20/21  8:46 PM   Specimen: Nasopharyngeal Swab; Nasopharyngeal(NP) swabs in vial transport medium  Result Value Ref Range   SARS Coronavirus 2 by RT PCR NEGATIVE NEGATIVE    Comment: (NOTE) SARS-CoV-2 target nucleic acids are NOT DETECTED.  The SARS-CoV-2 RNA is generally detectable in upper respiratory specimens during the acute phase of infection. The lowest concentration of SARS-CoV-2 viral copies this assay can detect is 138 copies/mL. A  negative result does not preclude SARS-Cov-2 infection and should not be used as the sole basis for treatment or other patient management decisions. A negative result may occur with  improper specimen collection/handling, submission of specimen other than nasopharyngeal swab, presence of viral mutation(s) within the areas targeted by this assay, and inadequate number of viral copies(<138 copies/mL). A negative result must be combined with clinical observations, patient history, and epidemiological information. The expected result is Negative.  Fact Sheet for Patients:  EntrepreneurPulse.com.au  Fact Sheet for Healthcare Providers:  IncredibleEmployment.be  This test is no t yet approved or cleared by the [place] FDA and  has been authorized for detection and/or diagnosis of SARS-CoV-2 by FDA  under an Emergency Use Authorization (EUA). This EUA will remain  in effect (meaning this test can be used) for the duration of the COVID-19 declaration under Section 564(b)(1) of the Act, 21 U.S.C.section 360bbb-3(b)(1), unless the authorization is terminated  or revoked sooner.       Influenza A by PCR NEGATIVE NEGATIVE   Influenza B by PCR NEGATIVE NEGATIVE    Comment: (NOTE) The Xpert Xpress SARS-CoV-2/FLU/RSV plus assay is intended as an aid in the diagnosis of influenza from Nasopharyngeal swab specimens and should not be used as a sole basis for treatment. Nasal washings and aspirates are unacceptable for Xpert Xpress SARS-CoV-2/FLU/RSV testing.  Fact Sheet for Patients: EntrepreneurPulse.com.au  Fact Sheet for Healthcare Providers: IncredibleEmployment.be  This test is not yet approved or cleared by the [place] FDA and has been authorized for detection and/or diagnosis of SARS-CoV-2 by FDA under an Emergency Use Authorization (EUA). This EUA will remain in effect (meaning this test can be used)  for the duration of the COVID-19 declaration under Section 564(b)(1) of the Act, 21 U.S.C. section 360bbb-3(b)(1), unless the authorization is terminated or revoked.  Performed at [facility], [address] [place], [place] [address]     Chemistries  Recent Labs  Lab 05/20/21 2046  NA 135  K 3.6  CL 99  CO2 29  GLUCOSE 168*  BUN 10  CREATININE 0.78  CALCIUM 8.5*  AST 14*  ALT 16  ALKPHOS 173*  BILITOT 0.2*   ------------------------------------------------------------------------------------------------------------------  ------------------------------------------------------------------------------------------------------------------ GFR: Estimated Creatinine Clearance: 74.6 mL/min (by C-G formula based on SCr of 0.78 mg/dL). Liver Function Tests: Recent Labs  Lab 05/20/21 2046  AST 14*  ALT 16  ALKPHOS 173*  BILITOT 0.2*  PROT 6.7  ALBUMIN 3.1*   No results for input(s): LIPASE, AMYLASE in the last 168 hours. No results for input(s): AMMONIA in the last 168 hours. Coagulation Profile: No results for input(s): INR, PROTIME in the last 168 hours. Cardiac Enzymes: No results for input(s): CKTOTAL, CKMB, CKMBINDEX, TROPONINI in the last 168 hours. BNP (last 3 results) No results for input(s): PROBNP in the last 8760 hours. HbA1C: No results for input(s): HGBA1C in the last 72 hours. CBG: Recent Labs  Lab 05/20/21 2045  GLUCAP 211*   Lipid Profile: No results for input(s): CHOL, HDL, LDLCALC, TRIG, CHOLHDL, LDLDIRECT in the last 72 hours. Thyroid Function Tests: No results for input(s): TSH, T4TOTAL, FREET4, T3FREE, THYROIDAB in the last 72 hours. Anemia Panel: No results for input(s): VITAMINB12, FOLATE, FERRITIN, TIBC, IRON, RETICCTPCT in the last 72 hours.  --------------------------------------------------------------------------------------------------------------- Urine analysis:    Component Value Date/Time   COLORURINE AMBER (A) 03/10/2021  1919   APPEARANCEUR CLOUDY (A) 03/10/2021 1919   LABSPEC 1.014 03/10/2021 1919   PHURINE 5.0 03/10/2021 1919   GLUCOSEU NEGATIVE 03/10/2021 1919   HGBUR SMALL (A) 03/10/2021 1919   BILIRUBINUR NEGATIVE 03/10/2021 1919   KETONESUR NEGATIVE 03/10/2021 1919   PROTEINUR 30 (A) 03/10/2021 1919   UROBILINOGEN 0.2 06/23/2013 1750   NITRITE POSITIVE (A) 03/10/2021 1919   LEUKOCYTESUR LARGE (A) 03/10/2021 1919      Imaging Results:    DG Foot Complete Left  Result Date: 05/20/2021 CLINICAL DATA:  Swelling, erythema, history of diabetes EXAM: LEFT FOOT - COMPLETE 3+ VIEW COMPARISON:  03/12/2021 FINDINGS: Frontal, oblique, and lateral views of the left foot are obtained. There is been a previous amputation mid aspect second metatarsal. 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 There is a comminuted intra-articular  fracture at the base of the first metatarsal. Suspected intra-articular fractures of the base of the second and third metatarsals. There is lateral and dorsal dislocation of the second through fifth tarsometatarsal joints, as well as dorsal displacement of the first metatarsal relative to the medial cuboid. Additionally, there is evidence of a stress or insufficiency fracture involving the third metatarsal, with linear sclerosis and buttressing involving the third metatarsal diaphysis. There is diffuse soft tissue swelling. IMPRESSION: 1. Fracture dislocations of the first through fifth tarsometatarsal joints as above, consistent with divergent Lisfranc deformity. 2. Stress or insufficiency fracture involving the third metatarsal, new since prior study. 3. Previous second metatarsal amputation. 4. Diffuse soft tissue swelling. Electronically Signed   By: Randa Ngo M.D.   On: 05/20/2021 21:36       Assessment & Plan:    Active Problems:   HTN (hypertension)   Tobacco abuse   Hyperlipidemia   DM2 (diabetes mellitus, type 2) ([place])    Cellulitis   Cellulitis/diabetic foot wound Lower extremity wound focused order set utilized Continue Rocephin and Flagyl Blood cultures pending Patient will need nonemergent orthopedic surgery consult for Lisfranc fracture ABIs Sed rate and CRP pending Pain control with pain scale No palpable fluid collection, exam changes may need consult to general surgery for I&D, however at this time is just diffuse edema Mild protein calorie malnutrition Encourage nutrient dense food choices Diabetes mellitus type 2 Continue 10 units of long-acting insulin nightly Continue sliding scale with meals Continue nightly sliding scale coverage Hypertension Continue hydrochlorothiazide and lisinopril Hyperlipidemia Continue statin Per pharmacy records patient has not filled this since January, may be noncompliant with this medication at home Tobacco abuse Patient is attempting to quit Continues to smoke a pack per day Nicotine patch ordered   DVT Prophylaxis-   Heparin- SCDs   AM Labs Ordered, also please review Full Orders  Family Communication: No family at bedside  Code Status: Full  Admission status: Inpatient :The appropriate admission status for this patient is INPATIENT. Inpatient status is judged to be reasonable and necessary in order to provide the required intensity of service to ensure the patient's safety. The patient's presenting symptoms, physical exam findings, and initial radiographic and laboratory data in the context of their chronic comorbidities is felt to place them at high risk for further clinical deterioration. Furthermore, it is not anticipated that the patient will be medically stable for discharge from the hospital within 2 midnights of admission. The following factors support the admission status of inpatient.     The patient's presenting symptoms include foot swelling. The worrisome physical exam findings include cellulitis. The initial radiographic and  laboratory data are worrisome because of Lisfranc fracture. The chronic co-morbidities include hypertension, hyperlipidemia, diabetes mellitus type 2, tobacco abuse.       * I certify that at the point of admission it is my clinical judgment that the patient will require inpatient hospital care spanning beyond 2 midnights from the point of admission due to high intensity of service, high risk for further deterioration and high frequency of surveillance required.*  Time spent in minutes : [place]

## 2021-05-20 NOTE — ED Triage Notes (Signed)
Pt. States their left leg is swollen and red. Pt is not sure when this happened. Pts. Left calf is swollen and red.

## 2021-05-20 NOTE — ED Notes (Signed)
Pt assisted to restroom via wheelchair. Sample collected.

## 2021-05-20 NOTE — ED Notes (Signed)
XR at bedside

## 2021-05-20 NOTE — ED Provider Notes (Signed)
[facility] EMERGENCY DEPARTMENT Provider Note   CSN: [id number] Arrival date & time: 05/20/21  1700     History Chief Complaint  Patient presents with   Leg Swelling    [name] is a 60 y.o. female.  Patient brought in by her son.  Patient with swelling to the left foot and leg.  [place] stated that some redness started to move up into the leg area just today.  No history of fall or injury.  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  But son said that closed a couple days ago.  But then the swelling started.  Patient had diabetic infection of that left foot she is got type 2 diabetes.  Also history of hypertension and hyperlipidemia.      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   Encounter for screening fecal occult blood testing 02/02/2021   Routine general medical examination at a health care facility 02/02/2021   Polyneuropathy in diabetes ([place]) 03/26/2020   Cellulitis of left lower extremity    Osteomyelitis ([place]) 03/25/2020   Closed fracture of right proximal humerus 10/08/18 10/14/2018   Special screening for malignant neoplasms, colon    Hidradenitis 12/13/2015   Smoker 12/13/2015   Bipolar I disorder, most recent episode (or current) manic ([place]) 04/01/2015   Unspecified constipation 06/26/2013   Nausea and vomiting 06/25/2013   Cellulitis and abscess of foot 06/23/2013   DM (diabetes mellitus) ([place]) 06/23/2013   HTN (hypertension) 06/23/2013   Tobacco abuse 06/23/2013    Hyperlipidemia 06/23/2013    Past Surgical History:  Procedure Laterality Date   FLEXIBLE SIGMOIDOSCOPY N/A 09/30/2017   Procedure: FLEXIBLE SIGMOIDOSCOPY;  Surgeon: Danie Binder, MD;  Location[facility];  Service: Endoscopy;  Laterality: N/A;   I & D EXTREMITY Left 03/12/2021   Procedure: 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;  Surgeon: Felipa Furnace, DPM;  Location: [place];  Service: Podiatry;  Laterality: Left;     OB History     Gravida  1   Para  1   Term  1   Preterm      AB      Living  1      SAB      IAB      Ectopic      Multiple      Live Births  1           Family History  Problem Relation Age of Onset   Asthma Mother    Cancer Mother    Hypertension Mother    Diabetes Mother    Hypertension Father    Diabetes Father    Other Brother        hit by car   Breast cancer Sister    Stroke Brother    Hypertension Maternal Grandmother    Diabetes Maternal Grandmother    Hyperlipidemia Maternal Grandmother    Hypertension Maternal Grandfather    Hyperlipidemia Maternal Grandfather  Hypertension Paternal Grandmother    Hyperlipidemia Paternal Grandmother    Hypertension Paternal Grandfather    Hyperlipidemia Paternal Grandfather     Social History   Tobacco Use   Smoking status: Every Day    Packs/day: 0.50    Years: 34.00    Pack years: 17.00    Types: Cigarettes   Smokeless tobacco: Never  Vaping Use   Vaping Use: Never used  Substance Use Topics   Alcohol use: No   Drug use: No    Home Medications Prior to Admission medications   Medication Sig Start Date End Date Taking? Authorizing Provider  ACCU-CHEK GUIDE test strip USE TO TEST 3 TIMESDDAILY. 03/30/20   [provider]  Accu-Chek Softclix Lancets lancets 3 (three) times daily. 03/30/20   [provider]  ascorbic acid (VITAMIN C) 500 MG tablet Take 1 tablet (500 mg total) by mouth daily. 03/12/21   Patrecia Pour, MD  aspirin 325 MG  tablet Take 325 mg by mouth every 4 (four) hours as needed. pain    [provider]  atorvastatin (LIPITOR) 40 MG tablet Take 1 tablet by mouth daily. 10/05/20   [provider]  blood glucose meter kit and supplies Dispense based on patient and insurance preference. Use up to four times daily as directed. (FOR ICD-10 E10.9, E11.9). 03/28/20   Johnson, Clanford L, MD  cholecalciferol (VITAMIN D3) 25 MCG (1000 UNIT) tablet Take 1,000 Units by mouth daily.    [provider]  diphenhydrAMINE HCl (BENADRYL PO) Take 25 mg by mouth every 6 (six) hours as needed (itching).    [provider]  doxycycline (VIBRA-TABS) 100 MG tablet Take 100 mg by mouth 2 (two) times daily. 03/12/21   [provider]  gabapentin (NEURONTIN) 100 MG capsule Take 1 capsule (100 mg total) by mouth 3 (three) times daily. 04/22/20   Felipa Furnace, DPM  glipiZIDE (GLUCOTROL) 5 MG tablet Take 5 mg by mouth 2 (two) times daily. 12/06/20   [provider]  insulin glargine (LANTUS) 100 UNIT/ML Solostar Pen Inject 10 Units into the skin at bedtime. 03/28/20   Johnson, Clanford L, MD  insulin lispro (HUMALOG) 100 UNIT/ML KwikPen Inject 0.03 mLs (3 Units total) into the skin with breakfast, with lunch, and with evening meal. 03/28/20   Johnson, Clanford L, MD  Insulin Pen Needle 31G X 5 MM MISC 1 Device by Does not apply route as directed. 03/28/20   Johnson, Clanford L, MD  lisinopril-hydrochlorothiazide (PRINZIDE,ZESTORETIC) 10-12.5 MG tablet Take 1 tablet by mouth daily.    [provider]  metFORMIN (GLUCOPHAGE) 850 MG tablet Take 850 mg by mouth 2 (two) times daily. 01/25/21   [provider]  oxybutynin (DITROPAN-XL) 10 MG 24 hr tablet Take 1 tablet (10 mg total) by mouth at bedtime. 02/02/21   Estill Dooms, NP  oxyCODONE-acetaminophen (PERCOCET) 5-325 MG tablet Take 1-2 tablets by mouth every 4 (four) hours as needed for severe pain. Patient not taking: Reported on  03/31/2021 03/12/21   Felipa Furnace, DPM  oxyCODONE-acetaminophen (PERCOCET) 5-325 MG tablet Take 1-2 tablets by mouth every 4 (four) hours as needed for severe pain. 03/20/21   Felipa Furnace, DPM  oxyCODONE-acetaminophen (PERCOCET) 5-325 MG tablet Take 1-2 tablets by mouth every 4 (four) hours as needed for severe pain. 04/21/21   Felipa Furnace, DPM  oxyCODONE-acetaminophen (PERCOCET) 5-325 MG tablet Take 1-2 tablets by mouth every 4 (four) hours as needed for severe pain. 05/02/21  Felipa Furnace, DPM  PROAIR HFA 108 (90 Base) MCG/ACT inhaler Inhale 2 puffs into the lungs every 4 (four) hours as needed for wheezing. 12/06/20   [provider]    Allergies    Patient has no known allergies.  Review of Systems   Review of Systems  Constitutional:  Negative for chills and fever.  HENT:  Negative for ear pain and sore throat.   Eyes:  Negative for pain and visual disturbance.  Respiratory:  Negative for cough and shortness of breath.   Cardiovascular:  Positive for leg swelling. Negative for chest pain and palpitations.  Gastrointestinal:  Negative for abdominal pain and vomiting.  Genitourinary:  Negative for dysuria and hematuria.  Musculoskeletal:  Negative for arthralgias and back pain.  Skin:  Negative for color change and rash.  Neurological:  Negative for seizures and syncope.  All other systems reviewed and are negative.  Physical Exam Updated Vital Signs BP (!) 144/84   Pulse 80   Temp 98.6 F (37 C) (Oral)   Resp 20   Ht 1.651 m (5' 5" )   Wt 72.6 kg   SpO2 93%   BMI 26.63 kg/m   Physical Exam Vitals and nursing note reviewed.  Constitutional:      General: She is not in acute distress.    Appearance: Normal appearance. She is well-developed.  HENT:     Head: Normocephalic and atraumatic.  Eyes:     Extraocular Movements: Extraocular movements intact.     Conjunctiva/sclera: Conjunctivae normal.     Pupils: Pupils are equal, round, and reactive to light.   Cardiovascular:     Rate and Rhythm: Normal rate and regular rhythm.     Heart sounds: No murmur heard. Pulmonary:     Effort: Pulmonary effort is normal. No respiratory distress.     Breath sounds: Normal breath sounds.  Abdominal:     Palpations: Abdomen is soft.     Tenderness: There is no abdominal tenderness.  Musculoskeletal:        General: Swelling and tenderness present. Normal range of motion.     Cervical back: Normal range of motion and neck supple.     Comments: Significant swelling to the left foot and leg.  With erythema moving up towards the knee.  Erythema to the foot as well as some generalized tenderness to palpation leg and foot.  Good cap refill.  Patient amputation site of the second toe that wound seems to be closed and healing well.  Skin:    General: Skin is warm and dry.  Neurological:     General: No focal deficit present.     Mental Status: She is alert and oriented to person, place, and time.    ED Results / Procedures / Treatments   Labs (all labs ordered are listed, but only abnormal results are displayed) Labs Reviewed  COMPREHENSIVE METABOLIC PANEL - Abnormal; Notable for the following components:      Result Value   Glucose, Bld 168 (*)    Calcium 8.5 (*)    Albumin 3.1 (*)    AST 14 (*)    Alkaline Phosphatase 173 (*)    Total Bilirubin 0.2 (*)    All other components within normal limits  C-REACTIVE PROTEIN - Abnormal; Notable for the following components:   CRP 9.4 (*)    All other components within normal limits  CBG MONITORING, ED - Abnormal; Notable for the following components:   Glucose-Capillary 211 (*)  All other components within normal limits  RESP PANEL BY RT-PCR (FLU A&B, COVID) ARPGX2  CULTURE, BLOOD (ROUTINE X 2)  CULTURE, BLOOD (ROUTINE X 2)  CBC WITH DIFFERENTIAL/PLATELET  SEDIMENTATION RATE  PREALBUMIN  HIV ANTIBODY (ROUTINE TESTING W REFLEX)  MAGNESIUM  PHOSPHORUS  CBC WITH DIFFERENTIAL/PLATELET  COMPREHENSIVE  METABOLIC PANEL    EKG EKG Interpretation  Date/Time:  Saturday May 20 2021 21:08:18 EDT Ventricular Rate:  71 PR Interval:  180 QRS Duration: 97 QT Interval:  411 QTC Calculation: 447 R Axis:   100 Text Interpretation: Sinus rhythm Right axis deviation Low voltage, precordial leads Nonspecific T abnormalities, lateral leads Minimal ST elevation, inferior leads Confirmed by Fredia Sorrow [phone]) on 05/20/2021 9:12:19 PM  Radiology DG Foot Complete Left  Result Date: 05/20/2021 CLINICAL DATA:  Swelling, erythema, history of diabetes EXAM: LEFT FOOT - COMPLETE 3+ VIEW COMPARISON:  03/12/2021 FINDINGS: Frontal, oblique, and lateral views of the left foot are obtained. There is been a previous amputation mid aspect second metatarsal. 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 There is a comminuted intra-articular fracture at the base of the first metatarsal. Suspected intra-articular fractures of the base of the second and third metatarsals. There is lateral and dorsal dislocation of the second through fifth tarsometatarsal joints, as well as dorsal displacement of the first metatarsal relative to the medial cuboid. Additionally, there is evidence of a stress or insufficiency fracture involving the third metatarsal, with linear sclerosis and buttressing involving the third metatarsal diaphysis. There is diffuse soft tissue swelling. IMPRESSION: 1. Fracture dislocations of the first through fifth tarsometatarsal joints as above, consistent with divergent Lisfranc deformity. 2. Stress or insufficiency fracture involving the third metatarsal, new since prior study. 3. Previous second metatarsal amputation. 4. Diffuse soft tissue swelling. Electronically Signed   By: Randa Ngo M.D.   On: 05/20/2021 21:36    Procedures Procedures   Medications Ordered in ED Medications  0.9 %  sodium chloride infusion (0 mLs Intravenous Stopped 05/20/21 2305)   vancomycin (VANCOCIN) IVPB 1000 mg/200 mL premix (1,000 mg Intravenous New Bag/Given 05/20/21 2315)  cefTRIAXone (ROCEPHIN) 2 g in sodium chloride 0.9 % 100 mL IVPB (has no administration in time range)    And  metroNIDAZOLE (FLAGYL) IVPB 500 mg (has no administration in time range)  oxyCODONE-acetaminophen (PERCOCET/ROXICET) 5-325 MG per tablet 1-2 tablet (has no administration in time range)  atorvastatin (LIPITOR) tablet 40 mg (has no administration in time range)  lisinopril-hydrochlorothiazide (ZESTORETIC) 10-12.5 MG per tablet 1 tablet (has no administration in time range)  oxybutynin (DITROPAN-XL) 24 hr tablet 10 mg (10 mg Oral Given 05/20/21 2305)  gabapentin (NEURONTIN) capsule 100 mg ( Oral Canceled Entry 05/20/21 2332)  0.9 %  sodium chloride infusion ( Intravenous Rate/Dose Change 05/20/21 2305)  acetaminophen (TYLENOL) tablet 650 mg (has no administration in time range)    Or  acetaminophen (TYLENOL) suppository 650 mg (has no administration in time range)  oxyCODONE (Oxy IR/ROXICODONE) immediate release tablet 5 mg (has no administration in time range)  morphine 2 MG/ML injection 2 mg (has no administration in time range)  ondansetron (ZOFRAN) tablet 4 mg (has no administration in time range)    Or  ondansetron (ZOFRAN) injection 4 mg (has no administration in time range)  heparin injection 5,000 Units (has no administration in time range)  insulin glargine-yfgn (SEMGLEE) injection 10 Units (has no administration in time range)  albuterol (PROVENTIL) (2.5 MG/3ML) 0.083% nebulizer solution 2.5 mg (has no administration in time  range)  insulin aspart (novoLOG) injection 0-15 Units (has no administration in time range)  insulin aspart (novoLOG) injection 0-5 Units (has no administration in time range)  nicotine (NICODERM CQ - dosed in mg/24 hours) patch 21 mg (has no administration in time range)  piperacillin-tazobactam (ZOSYN) IVPB 3.375 g (0 g Intravenous Stopped 05/20/21 2305)     ED Course  I have reviewed the triage vital signs and the nursing notes.  Pertinent labs & imaging results that were available during my care of the patient were reviewed by me and considered in my medical decision making (see chart for details).    MDM Rules/Calculators/A&P                          CRITICAL CARE Performed by: Fredia Sorrow Total critical care time: 40 minutes Critical care time was exclusive of separately billable procedures and treating other patients. Critical care was necessary to treat or prevent imminent or life-threatening deterioration. Critical care was time spent personally by me on the following activities: development of treatment plan with patient and/or surrogate as well as nursing, discussions with consultants, evaluation of patient's response to treatment, examination of patient, obtaining history from patient or surrogate, ordering and performing treatments and interventions, ordering and review of laboratory studies, ordering and review of radiographic studies, pulse oximetry and re-evaluation of patient's condition.   Clinically it looks like pretty significant cellulitis per [place] with spreading up into the anterior leg.  Redness and swelling to the left foot.  Some edema and swelling up into the leg area.  Good cap refill to the foot.  Figured that does go need admission for cellulitis to being diabetic started on Zosyn and vancomycin.  X-ray surprisingly showed multiple fractures of the foot and the tarsometatarsal joints first through fifth.  And that probably a stress fracture of the third metatarsal.  Discussed with Dr. Ginette Pitman on-call for orthopedics who reviewed the x-rays.  Recommended a bulky Jones dressing.  Patient has been followed by Dr. Aline Brochure in the past for humerus fracture.  Felt that patient would be okay for admission here Dr. Aline Brochure here in [place] could be consulted no emergent intervention needed from the fracture  standpoint.  Hospitalist will admit for the cellulitis and will consult orthopedics Final Clinical Impression(s) / ED Diagnoses Final diagnoses:  Cellulitis of left lower extremity  Left leg swelling  Multiple closed fractures of left foot, initial encounter    Rx / DC Orders ED Discharge Orders     None        Fredia Sorrow, MD 05/20/21 2346

## 2021-05-20 NOTE — ED Notes (Signed)
Lab in room.

## 2021-05-20 NOTE — ED Notes (Signed)
EDP in room  

## 2021-05-20 NOTE — ED Notes (Signed)
One set of cultures completed.

## 2021-05-21 ENCOUNTER — Inpatient Hospital Stay (HOSPITAL_COMMUNITY): Payer: Medicaid Other

## 2021-05-21 DIAGNOSIS — I10 Essential (primary) hypertension: Secondary | ICD-10-CM | POA: Diagnosis not present

## 2021-05-21 DIAGNOSIS — E11621 Type 2 diabetes mellitus with foot ulcer: Secondary | ICD-10-CM | POA: Diagnosis not present

## 2021-05-21 DIAGNOSIS — L03116 Cellulitis of left lower limb: Secondary | ICD-10-CM | POA: Diagnosis not present

## 2021-05-21 DIAGNOSIS — S92902A Unspecified fracture of left foot, initial encounter for closed fracture: Secondary | ICD-10-CM | POA: Diagnosis not present

## 2021-05-21 LAB — GLUCOSE, CAPILLARY
Glucose-Capillary: 177 mg/dL — ABNORMAL HIGH (ref 70–99)
Glucose-Capillary: 136 mg/dL — ABNORMAL HIGH (ref 70–99)

## 2021-05-21 LAB — COMPREHENSIVE METABOLIC PANEL
ALT: 17 U/L (ref 0–44)
AST: 14 U/L — ABNORMAL LOW (ref 15–41)
Albumin: 3.2 g/dL — ABNORMAL LOW (ref 3.5–5.0)
Alkaline Phosphatase: 182 U/L — ABNORMAL HIGH (ref 38–126)
Anion gap: 7 (ref 5–15)
BUN: 10 mg/dL (ref 6–20)
CO2: 32 mmol/L (ref 22–32)
Calcium: 8.4 mg/dL — ABNORMAL LOW (ref 8.9–10.3)
Chloride: 99 mmol/L (ref 98–111)
Creatinine, Ser: 0.79 mg/dL (ref 0.44–1.00)
GFR, Estimated: >60 mL/min (ref >60)
Glucose, Bld: 114 mg/dL — ABNORMAL HIGH (ref 70–99)
Potassium: 5.1 mmol/L (ref 3.5–5.1)
Sodium: 138 mmol/L (ref 135–145)
Total Bilirubin: 0.6 mg/dL (ref 0.3–1.2)
Total Protein: 7.2 g/dL (ref 6.5–8.1)

## 2021-05-21 LAB — CBC WITH DIFFERENTIAL/PLATELET
Abs Immature Granulocytes: 0.1 10*3/uL — ABNORMAL HIGH (ref 0.00–0.07)
Basophils Absolute: 0 10*3/uL (ref 0.0–0.1)
Basophils Relative: 0 %
Eosinophils Absolute: 0.5 10*3/uL (ref 0.0–0.5)
Eosinophils Relative: 3 %
HCT: 35.8 % — ABNORMAL LOW (ref 36.0–46.0)
Hemoglobin: 11.3 g/dL — ABNORMAL LOW (ref 12.0–15.0)
Immature Granulocytes: 1 %
Lymphocytes Relative: 10 %
Lymphs Abs: 1.4 10*3/uL (ref 0.7–4.0)
MCH: 30.5 pg (ref 26.0–34.0)
MCHC: 31.6 g/dL (ref 30.0–36.0)
MCV: 96.5 fL (ref 80.0–100.0)
Monocytes Absolute: 1 10*3/uL (ref 0.1–1.0)
Monocytes Relative: 7 %
Neutro Abs: 10.8 10*3/uL — ABNORMAL HIGH (ref 1.7–7.7)
Neutrophils Relative %: 79 %
Platelets: 368 10*3/uL (ref 150–400)
RBC: 3.71 MIL/uL — ABNORMAL LOW (ref 3.87–5.11)
RDW: 12.4 % (ref 11.5–15.5)
WBC: 13.7 10*3/uL — ABNORMAL HIGH (ref 4.0–10.5)
nRBC: 0 % (ref 0.0–0.2)

## 2021-05-21 LAB — CBG MONITORING, ED
Glucose-Capillary: 118 mg/dL — ABNORMAL HIGH (ref 70–99)
Glucose-Capillary: 127 mg/dL — ABNORMAL HIGH (ref 70–99)
Glucose-Capillary: 168 mg/dL — ABNORMAL HIGH (ref 70–99)
Glucose-Capillary: 154 mg/dL — ABNORMAL HIGH (ref 70–99)

## 2021-05-21 LAB — PHOSPHORUS: Phosphorus: 4.1 mg/dL (ref 2.5–4.6)

## 2021-05-21 LAB — PREALBUMIN: Prealbumin: 10.6 mg/dL — ABNORMAL LOW (ref 18–38)

## 2021-05-21 LAB — MAGNESIUM: Magnesium: 2 mg/dL (ref 1.7–2.4)

## 2021-05-21 LAB — SEDIMENTATION RATE: Sed Rate: 77 mm/hr — ABNORMAL HIGH (ref 0–22)

## 2021-05-21 LAB — HIV ANTIBODY (ROUTINE TESTING W REFLEX): HIV Screen 4th Generation wRfx: NONREACTIVE

## 2021-05-21 MED ORDER — ORAL CARE MOUTH RINSE
15.0000 mL | Freq: Two times a day (BID) | OROMUCOSAL | Status: DC
  Administered 2021-05-21: 15 mL via OROMUCOSAL

## 2021-05-21 MED ORDER — HYDROCHLOROTHIAZIDE 12.5 MG PO CAPS
12.5000 mg | ORAL_CAPSULE | Freq: Every day | ORAL | Status: DC
  Administered 2021-05-21 – 2021-05-29 (×9): 12.5 mg via ORAL
  Filled 2021-05-21 (×10): qty 1

## 2021-05-21 MED ORDER — LISINOPRIL 10 MG PO TABS
10.0000 mg | ORAL_TABLET | Freq: Every day | ORAL | Status: DC
  Administered 2021-05-21 – 2021-05-29 (×9): 10 mg via ORAL
  Filled 2021-05-21 (×10): qty 1

## 2021-05-21 NOTE — ED Notes (Signed)
Pt able to answer all of orientation questions however patient is forgetful and impulsive. Pt laughs and says sorry at dangerous behaviors- scooting to the end of the bed with all wires, leads, IV, purewick, stating she needs to go pee forgetting she has on a purewick -- and smoking in room.   Pt able to scoot self up in the bed without help .

## 2021-05-21 NOTE — ED Notes (Signed)
Pt noted to have O2 sats dropping into the low 80s while sleeping. Pt woke up and placed on 2L . Sats instantly returned to mid 90s.

## 2021-05-21 NOTE — ED Notes (Signed)
Pt eating crackers

## 2021-05-21 NOTE — Progress Notes (Signed)
PROGRESS NOTE    [name]  HER:[id number] DOB: 1961-08-04 DOA: 05/20/2021 PCP: Avon Gully, MD    Brief Narrative:  60 y/o female with history of HTN, DM, presents to ED with left foot swelling. She denies any injury to this area. She was noted to have cellulitis of LLE and started on antibiotics. Plain films of left foot indicated divergent lisfranc dislocation. Orthopedics consulted.   Assessment & Plan:   Active Problems:   HTN (hypertension)   Tobacco abuse   Hyperlipidemia   DM2 (diabetes mellitus, type 2) (HCC)   Cellulitis   LLE cellulitis -currently on IV antibiotics -overall erythema improving -venous doppler negative for DVT -left ABI 0.94 -keep LE elevated  Left foot divergent lisfranc dislocation -will keep patient NWB on LLE -orthopedics consultation -PT evaluation  DM2 -continue basal insulin -continue sliding scale -blood sugars stable  HTN -currently on lisinopril and HCTZ -BP stable  HLD -continue on statin   DVT prophylaxis: heparin injection 5,000 Units Start: 05/21/21 0600 SCDs Start: 05/20/21 2305  Code Status: full code Family Communication: discussed with patient Disposition Plan: Status is: Inpatient  Remains inpatient appropriate because:IV treatments appropriate due to intensity of illness or inability to take PO and Inpatient level of care appropriate due to severity of illness  Dispo: The patient is from: Home              Anticipated d/c is to:  TBD              Patient currently is not medically stable to d/c.   Difficult to place patient No         Consultants:    Procedures:    Antimicrobials:  Ceftriaxone 8/21> Flagyl 8/21>    Subjective: Feels that erythema in LLE improving  Objective: Vitals:   05/21/21 1200 05/21/21 1230 05/21/21 1545 05/21/21 1633  BP: (!) 112/56 124/62 (!) 122/59 (!) 117/57  Pulse: 71  (!) 102 85  Resp: (!) 23 (!) 23 (!) 36 20  Temp:    98.5 F (36.9 C)  TempSrc:     Oral  SpO2: 99%  93% 97%  Weight:    67.9 kg  Height:    5\' 5"  (1.651 m)    Intake/Output Summary (Last 24 hours) at 05/21/2021 1942 Last data filed at 05/21/2021 1655 Gross per 24 hour  Intake 240 ml  Output --  Net 240 ml   Filed Weights   05/20/21 1739 05/21/21 1633  Weight: 72.6 kg 67.9 kg    Examination:  General exam: Appears calm and comfortable  Respiratory system: Clear to auscultation. Respiratory effort normal. Cardiovascular system: S1 & S2 heard, RRR. No JVD, murmurs, rubs, gallops or clicks. No pedal edema. Gastrointestinal system: Abdomen is nondistended, soft and nontender. No organomegaly or masses felt. Normal bowel sounds heard. Central nervous system: Alert and oriented. No focal neurological deficits. Extremities: 5 x 5 power bilaterally Skin: No rashes, lesions or ulcers Psychiatry: Judgement and insight appear normal. Mood & affect appropriate.        Data Reviewed: I have personally reviewed following labs and imaging studies  CBC: Recent Labs  Lab 05/20/21 2046 05/21/21 0449  WBC 4.8 13.7*  NEUTROABS 2.6 10.8*  HGB 14.7 11.3*  HCT 43.9 35.8*  MCV 92.0 96.5  PLT 216 368   Basic Metabolic Panel: Recent Labs  Lab 05/20/21 2046 05/21/21 0449  NA 135 138  K 3.6 5.1  CL 99 99  CO2 29 32  GLUCOSE  168* 114*  BUN 10 10  CREATININE 0.78 0.79  CALCIUM 8.5* 8.4*  MG  --  2.0  PHOS  --  4.1   GFR: Estimated Creatinine Clearance: 67.3 mL/min (by C-G formula based on SCr of 0.79 mg/dL). Liver Function Tests: Recent Labs  Lab 05/20/21 2046 05/21/21 0449  AST 14* 14*  ALT 16 17  ALKPHOS 173* 182*  BILITOT 0.2* 0.6  PROT 6.7 7.2  ALBUMIN 3.1* 3.2*   No results for input(s): LIPASE, AMYLASE in the last 168 hours. No results for input(s): AMMONIA in the last 168 hours. Coagulation Profile: No results for input(s): INR, PROTIME in the last 168 hours. Cardiac Enzymes: No results for input(s): CKTOTAL, CKMB, CKMBINDEX, TROPONINI in  the last 168 hours. BNP (last 3 results) No results for input(s): PROBNP in the last 8760 hours. HbA1C: No results for input(s): HGBA1C in the last 72 hours. CBG: Recent Labs  Lab 05/21/21 0117 05/21/21 0816 05/21/21 1213 05/21/21 1441 05/21/21 1648  GLUCAP 118* 127* 168* 154* 177*   Lipid Profile: No results for input(s): CHOL, HDL, LDLCALC, TRIG, CHOLHDL, LDLDIRECT in the last 72 hours. Thyroid Function Tests: No results for input(s): TSH, T4TOTAL, FREET4, T3FREE, THYROIDAB in the last 72 hours. Anemia Panel: No results for input(s): VITAMINB12, FOLATE, FERRITIN, TIBC, IRON, RETICCTPCT in the last 72 hours. Sepsis Labs: No results for input(s): PROCALCITON, LATICACIDVEN in the last 168 hours.  Recent Results (from the past 240 hour(s))  Resp Panel by RT-PCR (Flu A&B, Covid) Nasopharyngeal Swab     Status: None   Collection Time: 05/20/21  8:46 PM   Specimen: Nasopharyngeal Swab; Nasopharyngeal(NP) swabs in vial transport medium  Result Value Ref Range Status   SARS Coronavirus 2 by RT PCR NEGATIVE NEGATIVE Final    Comment: (NOTE) SARS-CoV-2 target nucleic acids are NOT DETECTED.  The SARS-CoV-2 RNA is generally detectable in upper respiratory specimens during the acute phase of infection. The lowest concentration of SARS-CoV-2 viral copies this assay can detect is 138 copies/mL. A negative result does not preclude SARS-Cov-2 infection and should not be used as the sole basis for treatment or other patient management decisions. A negative result may occur with  improper specimen collection/handling, submission of specimen other than nasopharyngeal swab, presence of viral mutation(s) within the areas targeted by this assay, and inadequate number of viral copies(<138 copies/mL). A negative result must be combined with clinical observations, patient history, and epidemiological information. The expected result is Negative.  Fact Sheet for Patients:   BloggerCourse.com  Fact Sheet for Healthcare Providers:  SeriousBroker.it  This test is no t yet approved or cleared by the [place] FDA and  has been authorized for detection and/or diagnosis of SARS-CoV-2 by FDA under an Emergency Use Authorization (EUA). This EUA will remain  in effect (meaning this test can be used) for the duration of the COVID-19 declaration under Section 564(b)(1) of the Act, 21 U.S.C.section 360bbb-3(b)(1), unless the authorization is terminated  or revoked sooner.       Influenza A by PCR NEGATIVE NEGATIVE Final   Influenza B by PCR NEGATIVE NEGATIVE Final    Comment: (NOTE) The Xpert Xpress SARS-CoV-2/FLU/RSV plus assay is intended as an aid in the diagnosis of influenza from Nasopharyngeal swab specimens and should not be used as a sole basis for treatment. Nasal washings and aspirates are unacceptable for Xpert Xpress SARS-CoV-2/FLU/RSV testing.  Fact Sheet for Patients: BloggerCourse.com  Fact Sheet for Healthcare Providers: SeriousBroker.it  This test is not yet  approved or cleared by the [place] FDA and has been authorized for detection and/or diagnosis of SARS-CoV-2 by FDA under an Emergency Use Authorization (EUA). This EUA will remain in effect (meaning this test can be used) for the duration of the COVID-19 declaration under Section 564(b)(1) of the Act, 21 U.S.C. section 360bbb-3(b)(1), unless the authorization is terminated or revoked.  Performed at [facility], [address] [place], [place] [id number]          Radiology Studies: US Venous Img Lower Unilateral Left (DVT)  Result Date: 05/21/2021 CLINICAL DATA:  Left lower extremity swelling. Left second toe amputation. Left foot fractures. Diabetic nonhealing left foot wounds. EXAM: LEFT LOWER EXTREMITY VENOUS DOPPLER ULTRASOUND TECHNIQUE: Gray-scale sonography with  compression, as well as color and duplex ultrasound, were performed to evaluate the deep venous system(s) from the level of the common femoral vein through the popliteal and proximal calf veins. COMPARISON:  None. FINDINGS: VENOUS Normal compressibility of the common femoral, superficial femoral, and popliteal veins, as well as the visualized calf veins. Visualized portions of profunda femoral vein and great saphenous vein unremarkable. No filling defects to suggest DVT on grayscale or color Doppler imaging. Doppler waveforms show normal direction of venous flow, normal respiratory plasticity and response to augmentation. Limited views of the contralateral common femoral vein are unremarkable. OTHER Soft tissue edema noted in the left leg. There are abnormal left inguinal lymph nodes, enlarged cortical thickening, but overall maintaining normal morphologic shapes. Largest node measures 4 cm in long axis, 1.5 cm in short axis and has a cortical thickness up to 9 mm. Limitations: none IMPRESSION: 1. No evidence of a left lower extremity deep venous thrombosis. 2. Left lower extremity nonspecific edema. 3. Enlarged left inguinal lymph nodes, which are presumably reactive given the reported left foot wounds. Electronically Signed   By: Amie Portlandavid  Ormond M.D.   On: 05/21/2021 11:10   US ARTERIAL ABI (SCREENING LOWER EXTREMITY)  Result Date: 05/21/2021 CLINICAL DATA:  Current smoker Hypertension Hyperlipidemia Diabetes EXAM: NONINVASIVE PHYSIOLOGIC VASCULAR STUDY OF BILATERAL LOWER EXTREMITIES TECHNIQUE: Evaluation of both lower extremities were performed at rest, including calculation of ankle-brachial indices with single level Doppler, pressure and pulse volume recording. COMPARISON:  None. FINDINGS: Right ABI:  1.16 Left ABI:  0.94 Right Lower Extremity:  Normal arterial waveforms at the ankle. Left Lower Extremity:  Normal arterial waveforms at the ankle. 0.9-0.99 Borderline PAD IMPRESSION: Findings suggestive of  borderline left lower extremity PAD. Electronically Signed   By: Acquanetta BellingFarhaan  Mir M.D.   On: 05/21/2021 10:25   DG Foot Complete Left  Result Date: 05/20/2021 CLINICAL DATA:  Swelling, erythema, history of diabetes EXAM: LEFT FOOT - COMPLETE 3+ VIEW COMPARISON:  03/12/2021 FINDINGS: Frontal, oblique, and lateral views of the left foot are obtained. There is been a previous amputation mid aspect second metatarsal. 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 There is a comminuted intra-articular fracture at the base of the first metatarsal. Suspected intra-articular fractures of the base of the second and third metatarsals. There is lateral and dorsal dislocation of the second through fifth tarsometatarsal joints, as well as dorsal displacement of the first metatarsal relative to the medial cuboid. Additionally, there is evidence of a stress or insufficiency fracture involving the third metatarsal, with linear sclerosis and buttressing involving the third metatarsal diaphysis. There is diffuse soft tissue swelling. IMPRESSION: 1. Fracture dislocations of the first through fifth tarsometatarsal joints as above, consistent with divergent Lisfranc deformity. 2. Stress or  insufficiency fracture involving the third metatarsal, new since prior study. 3. Previous second metatarsal amputation. 4. Diffuse soft tissue swelling. Electronically Signed   By: Sharlet Salina M.D.   On: 05/20/2021 21:36        Scheduled Meds:  atorvastatin  40 mg Oral Daily   gabapentin  100 mg Oral TID   heparin  5,000 Units Subcutaneous Q8H   lisinopril  10 mg Oral Daily   And   hydrochlorothiazide  12.5 mg Oral Daily   insulin aspart  0-15 Units Subcutaneous TID WC   insulin aspart  0-5 Units Subcutaneous QHS   insulin glargine-yfgn  10 Units Subcutaneous QHS   mouth rinse  15 mL Mouth Rinse BID   nicotine  21 mg Transdermal Daily   oxybutynin  10 mg Oral QHS   Continuous  Infusions:  sodium chloride Stopped (05/20/21 2305)   sodium chloride 100 mL/hr at 05/20/21 2305   cefTRIAXone (ROCEPHIN)  IV Stopped (05/21/21 5374)   And   metronidazole 500 mg (05/21/21 1723)     LOS: 1 day    Time spent:    Erick Blinks, MD [facility]   If 7PM-7AM, please contact night-coverage [email or web address]  05/21/2021, 7:42 PM

## 2021-05-21 NOTE — ED Notes (Signed)
Staff smelling smoke in hall; when tracing smell, patient was smoking a ciragette in room. Informed how dangerous it was. Belongings taken and bagged. Room door left open . hospitalist aware

## 2021-05-21 NOTE — ED Notes (Signed)
Pt currently in bed with purewick. Pt does not want splint placed until later today.

## 2021-05-21 NOTE — ED Notes (Signed)
Changed pt from the waist down and put dry brief and clean purwick replaced

## 2021-05-22 DIAGNOSIS — M14672 Charcot's joint, left ankle and foot: Secondary | ICD-10-CM | POA: Diagnosis not present

## 2021-05-22 DIAGNOSIS — L03116 Cellulitis of left lower limb: Principal | ICD-10-CM

## 2021-05-22 DIAGNOSIS — I10 Essential (primary) hypertension: Secondary | ICD-10-CM | POA: Diagnosis not present

## 2021-05-22 DIAGNOSIS — E11621 Type 2 diabetes mellitus with foot ulcer: Secondary | ICD-10-CM | POA: Diagnosis not present

## 2021-05-22 DIAGNOSIS — S92902A Unspecified fracture of left foot, initial encounter for closed fracture: Secondary | ICD-10-CM | POA: Diagnosis not present

## 2021-05-22 LAB — CBC
HCT: 29 % — ABNORMAL LOW (ref 36.0–46.0)
Hemoglobin: 9.3 g/dL — ABNORMAL LOW (ref 12.0–15.0)
MCH: 30.9 pg (ref 26.0–34.0)
MCHC: 32.1 g/dL (ref 30.0–36.0)
MCV: 96.3 fL (ref 80.0–100.0)
Platelets: 318 10*3/uL (ref 150–400)
RBC: 3.01 MIL/uL — ABNORMAL LOW (ref 3.87–5.11)
RDW: 12.3 % (ref 11.5–15.5)
WBC: 7.3 10*3/uL (ref 4.0–10.5)
nRBC: 0 % (ref 0.0–0.2)

## 2021-05-22 LAB — BASIC METABOLIC PANEL
Anion gap: 6 (ref 5–15)
BUN: 12 mg/dL (ref 6–20)
CO2: 30 mmol/L (ref 22–32)
Calcium: 8.3 mg/dL — ABNORMAL LOW (ref 8.9–10.3)
Chloride: 101 mmol/L (ref 98–111)
Creatinine, Ser: 0.82 mg/dL (ref 0.44–1.00)
GFR, Estimated: >60 mL/min (ref >60)
Glucose, Bld: 146 mg/dL — ABNORMAL HIGH (ref 70–99)
Potassium: 4.7 mmol/L (ref 3.5–5.1)
Sodium: 137 mmol/L (ref 135–145)

## 2021-05-22 LAB — GLUCOSE, CAPILLARY
Glucose-Capillary: 142 mg/dL — ABNORMAL HIGH (ref 70–99)
Glucose-Capillary: 192 mg/dL — ABNORMAL HIGH (ref 70–99)
Glucose-Capillary: 203 mg/dL — ABNORMAL HIGH (ref 70–99)
Glucose-Capillary: 263 mg/dL — ABNORMAL HIGH (ref 70–99)

## 2021-05-22 MED ORDER — QUETIAPINE FUMARATE 100 MG PO TABS
200.0000 mg | ORAL_TABLET | Freq: Every day | ORAL | Status: DC
  Administered 2021-05-22 – 2021-05-29 (×8): 200 mg via ORAL
  Filled 2021-05-22 (×8): qty 2

## 2021-05-22 MED ORDER — PROSOURCE PLUS PO LIQD
30.0000 mL | Freq: Two times a day (BID) | ORAL | Status: DC
  Administered 2021-05-22 – 2021-05-30 (×16): 30 mL via ORAL
  Filled 2021-05-22 (×16): qty 30

## 2021-05-22 MED ORDER — HALOPERIDOL LACTATE 5 MG/ML IJ SOLN
5.0000 mg | Freq: Four times a day (QID) | INTRAMUSCULAR | Status: DC | PRN
  Administered 2021-05-22 – 2021-05-27 (×3): 5 mg via INTRAVENOUS
  Filled 2021-05-22 (×3): qty 1

## 2021-05-22 NOTE — Progress Notes (Signed)
Initial Nutrition Assessment  DOCUMENTATION CODES:      INTERVENTION:  ProSource Plus 30 ml TID (each 30 ml provides 100 kcal, 15 gr protein)   Snack from nourishment room as desired  NUTRITION DIAGNOSIS:   Increased nutrient needs related to  (acute cellutitis) as evidenced by estimated needs.   GOAL:  Patient will meet greater than or equal to 90% of their needs   MONITOR:  PO intake, Supplement acceptance, Skin, Weight trends  REASON FOR ASSESSMENT:   Consult Wound healing  ASSESSMENT: Patient is a 60 yo female with history of DM2 HTN. Presents with left foot cellulitis and  left foot divergent lisfranc dislocation. Patient is a tobacco smoker.   RD consult due to wound healing. Po intake is excellent- 100% of documented meals. Able to feed herself and verbalize meal selections within the therapeutic restrictions of her HH/CHO mod diet. Expect pt is meeting est nutrition needs based on meals and observed intake.  Weight has ranged between 68-70 kg the past 3+ months and wt was recorded as 74.4 kg last July. No significant change noted.    Medications reviewed.   Labs: BMP Latest Ref Rng & Units 05/22/2021 05/21/2021 05/20/2021  Glucose 70 - 99 mg/dL 387(F) 643(P) 295(J)  BUN 6 - 20 mg/dL 12 10 10   Creatinine 0.44 - 1.00 mg/dL 8.84 1.66  Sodium 135 - 145 mmol/L 137 138 135  Potassium 3.5 - 5.1 mmol/L 4.7 5.1 3.6  Chloride 98 - 111 mmol/L 101 99 99  CO2 22 - 32 mmol/L 30 32 29  Calcium 8.9 - 10.3 mg/dL 8.3(L) 8.4(L) 8.5(L)      Diet Order:   Diet Order             Diet heart healthy/carb modified Room service appropriate? Yes; Fluid consistency: Thin  Diet effective now                   EDUCATION NEEDS:  Not appropriate for education at this time  Skin:  Skin Assessment: Reviewed RN Assessment. No skin breakdown per nursing assessment.   Last BM:  pt unable to remember  Height:   Ht Readings from Last 1 Encounters:  05/21/21 5\' 5"  (1.651 m)     Weight:   Wt Readings from Last 1 Encounters:  05/21/21 67.9 kg    Ideal Body Weight:   57 kg   BMI:  Body mass index is 24.91 kg/m.  Estimated Nutritional Needs:   Kcal:  1700-1836  Protein:  82-88 gr  Fluid:  >1700 ml daily   MS,RD,CSG,LDN Contact: 05/23/21

## 2021-05-22 NOTE — Consult Note (Addendum)
Reason for Consult:I was asked to consult on the patient to opine on their left foot  Referring Physician: Dr Natale Lay is an 60 y.o. female.   HPI: 60 yo female s/p 2nd toe and partial ray resection in June presented to our facility with pain swelling and erythema, thought to have started after sutures were removed and there was some signs of dehiscence (Dr Jill Alexanders last note). Once the wound closed the foot started swelling. On evaluation in the ED the xrays show subluxation and fractures around [facility] joint. There was no history of trauma although the patient says she has fallen 2 x.    Past Medical History:  Diagnosis Date   Diabetes mellitus without complication (HCC)    Hidradenitis 12/13/2015   Hypercholesterolemia    Hypertension    Smoker 12/13/2015    Past Surgical History:  Procedure Laterality Date   FLEXIBLE SIGMOIDOSCOPY N/A 09/30/2017   Procedure: FLEXIBLE SIGMOIDOSCOPY;  Surgeon: West Bali, MD;  Location: [facility];  Service: Endoscopy;  Laterality: N/A;   I & D EXTREMITY Left 03/12/2021   Procedure: 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;  Surgeon: [name], DPM;  Location: [facility] OR;  Service: Podiatry;  Laterality: Left;    Family History  Problem Relation Age of Onset   Asthma Mother    Cancer Mother    Hypertension Mother    Diabetes Mother    Hypertension Father    Diabetes Father    Other Brother        hit by car   Breast cancer Sister    Stroke Brother    Hypertension Maternal Grandmother    Diabetes Maternal Grandmother    Hyperlipidemia Maternal Grandmother    Hypertension Maternal Grandfather    Hyperlipidemia Maternal Grandfather    Hypertension Paternal Grandmother    Hyperlipidemia Paternal Grandmother    Hypertension Paternal Grandfather    Hyperlipidemia Paternal Grandfather     Social History:  reports that she has been smoking cigarettes. She has a 17.00 pack-year smoking history. She has never  used smokeless tobacco. She reports that she does not drink alcohol and does not use drugs.  Allergies: No Known Allergies  Medications: I have reviewed the patient's current medications.  Results for orders placed or performed during the hospital encounter of 05/20/21 (from the past 48 hour(s))  CBG monitoring, ED     Status: Abnormal   Collection Time: 05/20/21  8:45 PM  Result Value Ref Range   Glucose-Capillary 211 (H) 70 - 99 mg/dL    Comment: Glucose reference range applies only to samples taken after fasting for at least 8 hours.  CBC with Differential/Platelet     Status: None   Collection Time: 05/20/21  8:46 PM  Result Value Ref Range   WBC 4.8 4.0 - 10.5 K/uL   RBC 4.77 3.87 - 5.11 MIL/uL   Hemoglobin 14.7 12.0 - 15.0 g/dL   HCT 09.8 11.9 - 14.7 %   MCV 92.0 80.0 - 100.0 fL   MCH 30.8 26.0 - 34.0 pg   MCHC 33.5 30.0 - 36.0 g/dL   RDW 82.9 56.2 - 13.0 %   Platelets 216 150 - 400 K/uL   nRBC 0.0 0.0 - 0.2 %   Neutrophils Relative % 55 %   Neutro Abs 2.6 1.7 - 7.7 K/uL   Lymphocytes Relative 37 %   Lymphs Abs 1.8 0.7 - 4.0 K/uL   Monocytes Relative 7 %  Monocytes Absolute 0.4 0.1 - 1.0 K/uL   Eosinophils Relative 1 %   Eosinophils Absolute 0.1 0.0 - 0.5 K/uL   Basophils Relative 0 %   Basophils Absolute 0.0 0.0 - 0.1 K/uL   Immature Granulocytes 0 %   Abs Immature Granulocytes 0.02 0.00 - 0.07 K/uL    Comment: Performed at [facility], [address], [place], [place] [address]  Comprehensive metabolic panel     Status: Abnormal   Collection Time: 05/20/21  8:46 PM  Result Value Ref Range   Sodium 135 135 - 145 mmol/L   Potassium 3.6 3.5 - 5.1 mmol/L   Chloride 99 98 - 111 mmol/L   CO2 29 22 - 32 mmol/L   Glucose, Bld 168 (H) 70 - 99 mg/dL    Comment: Glucose reference range applies only to samples taken after fasting for at least 8 hours.   BUN 10 6 - 20 mg/dL   Creatinine, Ser 6.04 0.44 - 1.00 mg/dL   Calcium 8.5 (L) 8.9 - 10.3 mg/dL   Total Protein 6.7  6.5 - 8.1 g/dL   Albumin 3.1 (L) 3.5 - 5.0 g/dL   AST 14 (L) 15 - 41 U/L   ALT 16 0 - 44 U/L   Alkaline Phosphatase 173 (H) 38 - 126 U/L   Total Bilirubin 0.2 (L) 0.3 - 1.2 mg/dL   GFR, Estimated >54 >09 mL/min    Comment: (NOTE) Calculated using the CKD-EPI Creatinine Equation (2021)    Anion gap 7 5 - 15    Comment: Performed at [facility], [address] [place], [place] [address]  Resp Panel by RT-PCR (Flu A&B, Covid) Nasopharyngeal Swab     Status: None   Collection Time: 05/20/21  8:46 PM   Specimen: Nasopharyngeal Swab; Nasopharyngeal(NP) swabs in vial transport medium  Result Value Ref Range   SARS Coronavirus 2 by RT PCR NEGATIVE NEGATIVE    Comment: (NOTE) SARS-CoV-2 target nucleic acids are NOT DETECTED.  The SARS-CoV-2 RNA is generally detectable in upper respiratory specimens during the acute phase of infection. The lowest concentration of SARS-CoV-2 viral copies this assay can detect is 138 copies/mL. A negative result does not preclude SARS-Cov-2 infection and should not be used as the sole basis for treatment or other patient management decisions. A negative result may occur with  improper specimen collection/handling, submission of specimen other than nasopharyngeal swab, presence of viral mutation(s) within the areas targeted by this assay, and inadequate number of viral copies(<138 copies/mL). A negative result must be combined with clinical observations, patient history, and epidemiological information. The expected result is Negative.  Fact Sheet for Patients:  BloggerCourse.com  Fact Sheet for Healthcare Providers:  SeriousBroker.it  This test is no t yet approved or cleared by the [place] FDA and  has been authorized for detection and/or diagnosis of SARS-CoV-2 by FDA under an Emergency Use Authorization (EUA). This EUA will remain  in effect (meaning this test can be used) for the duration of  the COVID-19 declaration under Section 564(b)(1) of the Act, 21 U.S.C.section 360bbb-3(b)(1), unless the authorization is terminated  or revoked sooner.       Influenza A by PCR NEGATIVE NEGATIVE   Influenza B by PCR NEGATIVE NEGATIVE    Comment: (NOTE) The Xpert Xpress SARS-CoV-2/FLU/RSV plus assay is intended as an aid in the diagnosis of influenza from Nasopharyngeal swab specimens and should not be used as a sole basis for treatment. Nasal washings and aspirates are unacceptable for Xpert  Xpress SARS-CoV-2/FLU/RSV testing.  Fact Sheet for Patients: BloggerCourse.com  Fact Sheet for Healthcare Providers: SeriousBroker.it  This test is not yet approved or cleared by the [place] FDA and has been authorized for detection and/or diagnosis of SARS-CoV-2 by FDA under an Emergency Use Authorization (EUA). This EUA will remain in effect (meaning this test can be used) for the duration of the COVID-19 declaration under Section 564(b)(1) of the Act, 21 U.S.C. section 360bbb-3(b)(1), unless the authorization is terminated or revoked.  Performed at [facility], [address], [place], [place] [address]   Culture, blood (Routine X 2) w Reflex to ID Panel     Status: None (Preliminary result)   Collection Time: 05/20/21  8:46 PM   Specimen: BLOOD RIGHT FOREARM  Result Value Ref Range   Specimen Description BLOOD RIGHT FOREARM    Special Requests      BOTTLES DRAWN AEROBIC AND ANAEROBIC Blood Culture adequate volume   Culture      NO GROWTH 2 DAYS Performed at [facility], [address] [place], [place] [address]    Report Status PENDING   Sedimentation rate     Status: Abnormal   Collection Time: 05/20/21  8:46 PM  Result Value Ref Range   Sed Rate 77 (H) 0 - 22 mm/hr    Comment: Performed at [facility], [address], [place], [place] [address]  C-reactive protein     Status: Abnormal   Collection Time: 05/20/21  8:46  PM  Result Value Ref Range   CRP 9.4 (H) <1.0 mg/dL    Comment: Performed at [facility], [address] [place], [place] [address]  Prealbumin     Status: Abnormal   Collection Time: 05/20/21  8:46 PM  Result Value Ref Range   Prealbumin 10.6 (L) 18 - 38 mg/dL    Comment: Performed at [facility], [address], [place], [place] [address]  Culture, blood (Routine X 2) w Reflex to ID Panel     Status: None (Preliminary result)   Collection Time: 05/20/21  9:23 PM   Specimen: BLOOD LEFT ARM  Result Value Ref Range   Specimen Description BLOOD LEFT ARM    Special Requests      BOTTLES DRAWN AEROBIC AND ANAEROBIC Blood Culture adequate volume   Culture      NO GROWTH 2 DAYS Performed at [facility], [address], [place], [place] [address]    Report Status PENDING   CBG monitoring, ED     Status: Abnormal   Collection Time: 05/21/21  1:17 AM  Result Value Ref Range   Glucose-Capillary 118 (H) 70 - 99 mg/dL    Comment: Glucose reference range applies only to samples taken after fasting for at least 8 hours.  HIV Antibody (routine testing w rflx)     Status: None   Collection Time: 05/21/21  4:49 AM  Result Value Ref Range   HIV Screen 4th Generation wRfx Non Reactive Non Reactive    Comment: Performed at [facility], [address] [place], [place] [address]  Magnesium     Status: None   Collection Time: 05/21/21  4:49 AM  Result Value Ref Range   Magnesium 2.0 1.7 - 2.4 mg/dL    Comment: Performed at [facility], [address] [place], [place] [address]  Phosphorus     Status: None   Collection Time: 05/21/21  4:49 AM  Result Value Ref Range   Phosphorus 4.1 2.5 -  4.6 mg/dL    Comment: Performed at [facility], [address], [place], [place] [address]  CBC WITH DIFFERENTIAL     Status: Abnormal   Collection Time: 05/21/21  4:49 AM  Result Value Ref Range   WBC 13.7 (H) 4.0 - 10.5 K/uL   RBC 3.71 (L) 3.87 - 5.11 MIL/uL   Hemoglobin 11.3 (L) 12.0 - 15.0  g/dL   HCT 09.7 (L) 35.3 - 29.9 %   MCV 96.5 80.0 - 100.0 fL   MCH 30.5 26.0 - 34.0 pg   MCHC 31.6 30.0 - 36.0 g/dL   RDW 24.2 68.3 - 41.9 %   Platelets 368 150 - 400 K/uL   nRBC 0.0 0.0 - 0.2 %   Neutrophils Relative % 79 %   Neutro Abs 10.8 (H) 1.7 - 7.7 K/uL   Lymphocytes Relative 10 %   Lymphs Abs 1.4 0.7 - 4.0 K/uL   Monocytes Relative 7 %   Monocytes Absolute 1.0 0.1 - 1.0 K/uL   Eosinophils Relative 3 %   Eosinophils Absolute 0.5 0.0 - 0.5 K/uL   Basophils Relative 0 %   Basophils Absolute 0.0 0.0 - 0.1 K/uL   Immature Granulocytes 1 %   Abs Immature Granulocytes 0.10 (H) 0.00 - 0.07 K/uL    Comment: Performed at [facility], [address], [place], [place] [address]  Comprehensive metabolic panel     Status: Abnormal   Collection Time: 05/21/21  4:49 AM  Result Value Ref Range   Sodium 138 135 - 145 mmol/L   Potassium 5.1 3.5 - 5.1 mmol/L    Comment: DELTA CHECK NOTED   Chloride 99 98 - 111 mmol/L   CO2 32 22 - 32 mmol/L   Glucose, Bld 114 (H) 70 - 99 mg/dL    Comment: Glucose reference range applies only to samples taken after fasting for at least 8 hours.   BUN 10 6 - 20 mg/dL   Creatinine, Ser 7.98 0.44 - 1.00 mg/dL   Calcium 8.4 (L) 8.9 - 10.3 mg/dL   Total Protein 7.2 6.5 - 8.1 g/dL   Albumin 3.2 (L) 3.5 - 5.0 g/dL   AST 14 (L) 15 - 41 U/L   ALT 17 0 - 44 U/L   Alkaline Phosphatase 182 (H) 38 - 126 U/L   Total Bilirubin 0.6 0.3 - 1.2 mg/dL   GFR, Estimated >92 >11 mL/min    Comment: (NOTE) Calculated using the CKD-EPI Creatinine Equation (2021)    Anion gap 7 5 - 15    Comment: Performed at [facility], [address], [place], [place] [address]  CBG monitoring, ED     Status: Abnormal   Collection Time: 05/21/21  8:16 AM  Result Value Ref Range   Glucose-Capillary 127 (H) 70 - 99 mg/dL    Comment: Glucose reference range applies only to samples taken after fasting for at least 8 hours.  CBG monitoring, ED     Status: Abnormal   Collection Time:  05/21/21 12:13 PM  Result Value Ref Range   Glucose-Capillary 168 (H) 70 - 99 mg/dL    Comment: Glucose reference range applies only to samples taken after fasting for at least 8 hours.  CBG monitoring, ED     Status: Abnormal   Collection Time: 05/21/21  2:41 PM  Result Value Ref Range   Glucose-Capillary 154 (H) 70 - 99 mg/dL    Comment: Glucose reference range applies only to samples taken after fasting for at least 8  hours.  Glucose, capillary     Status: Abnormal   Collection Time: 05/21/21  4:48 PM  Result Value Ref Range   Glucose-Capillary 177 (H) 70 - 99 mg/dL    Comment: Glucose reference range applies only to samples taken after fasting for at least 8 hours.   Comment 1 Notify RN    Comment 2 Document in Chart   Glucose, capillary     Status: Abnormal   Collection Time: 05/21/21  9:43 PM  Result Value Ref Range   Glucose-Capillary 136 (H) 70 - 99 mg/dL    Comment: Glucose reference range applies only to samples taken after fasting for at least 8 hours.  CBC     Status: Abnormal   Collection Time: 05/22/21  5:31 AM  Result Value Ref Range   WBC 7.3 4.0 - 10.5 K/uL   RBC 3.01 (L) 3.87 - 5.11 MIL/uL   Hemoglobin 9.3 (L) 12.0 - 15.0 g/dL   HCT 83.3 (L) 82.5 - 05.3 %   MCV 96.3 80.0 - 100.0 fL   MCH 30.9 26.0 - 34.0 pg   MCHC 32.1 30.0 - 36.0 g/dL   RDW 97.6 73.4 - 19.3 %   Platelets 318 150 - 400 K/uL   nRBC 0.0 0.0 - 0.2 %    Comment: Performed at [facility], [address] [place], [place] [address]  Basic metabolic panel     Status: Abnormal   Collection Time: 05/22/21  5:31 AM  Result Value Ref Range   Sodium 137 135 - 145 mmol/L   Potassium 4.7 3.5 - 5.1 mmol/L   Chloride 101 98 - 111 mmol/L   CO2 30 22 - 32 mmol/L   Glucose, Bld 146 (H) 70 - 99 mg/dL    Comment: Glucose reference range applies only to samples taken after fasting for at least 8 hours.   BUN 12 6 - 20 mg/dL   Creatinine, Ser 0.97 0.44 - 1.00 mg/dL   Calcium 8.3 (L) 8.9 - 10.3 mg/dL   GFR,  Estimated >35 >32 mL/min    Comment: (NOTE) Calculated using the CKD-EPI Creatinine Equation (2021)    Anion gap 6 5 - 15    Comment: Performed at [facility], [address] [place], [place] [address]  Glucose, capillary     Status: Abnormal   Collection Time: 05/22/21  7:29 AM  Result Value Ref Range   Glucose-Capillary 142 (H) 70 - 99 mg/dL    Comment: Glucose reference range applies only to samples taken after fasting for at least 8 hours.  Glucose, capillary     Status: Abnormal   Collection Time: 05/22/21 11:17 AM  Result Value Ref Range   Glucose-Capillary 192 (H) 70 - 99 mg/dL    Comment: Glucose reference range applies only to samples taken after fasting for at least 8 hours.    US Venous Img Lower Unilateral Left (DVT)  Result Date: 05/21/2021 CLINICAL DATA:  Left lower extremity swelling. Left second toe amputation. Left foot fractures. Diabetic nonhealing left foot wounds. EXAM: LEFT LOWER EXTREMITY VENOUS DOPPLER ULTRASOUND TECHNIQUE: Gray-scale sonography with compression, as well as color and duplex ultrasound, were performed to evaluate the deep venous system(s) from the level of the common femoral vein through the popliteal and proximal calf veins. COMPARISON:  None. FINDINGS: VENOUS Normal compressibility of the common femoral, superficial femoral, and popliteal veins, as well as the visualized calf veins. Visualized portions of profunda femoral vein and great saphenous vein unremarkable. No filling defects  to suggest DVT on grayscale or color Doppler imaging. Doppler waveforms show normal direction of venous flow, normal respiratory plasticity and response to augmentation. Limited views of the contralateral common femoral vein are unremarkable. OTHER Soft tissue edema noted in the left leg. There are abnormal left inguinal lymph nodes, enlarged cortical thickening, but overall maintaining normal morphologic shapes. Largest node measures 4 cm in long axis, 1.5 cm in short axis  and has a cortical thickness up to 9 mm. Limitations: none IMPRESSION: 1. No evidence of a left lower extremity deep venous thrombosis. 2. Left lower extremity nonspecific edema. 3. Enlarged left inguinal lymph nodes, which are presumably reactive given the reported left foot wounds. Electronically Signed   By: Amie Portland M.D.   On: 05/21/2021 11:10   US ARTERIAL ABI (SCREENING LOWER EXTREMITY)  Result Date: 05/21/2021 CLINICAL DATA:  Current smoker Hypertension Hyperlipidemia Diabetes EXAM: NONINVASIVE PHYSIOLOGIC VASCULAR STUDY OF BILATERAL LOWER EXTREMITIES TECHNIQUE: Evaluation of both lower extremities were performed at rest, including calculation of ankle-brachial indices with single level Doppler, pressure and pulse volume recording. COMPARISON:  None. FINDINGS: Right ABI:  1.16 Left ABI:  0.94 Right Lower Extremity:  Normal arterial waveforms at the ankle. Left Lower Extremity:  Normal arterial waveforms at the ankle. 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 Electronically Signed   By: Acquanetta Belling M.D.   On: 05/21/2021 10:25   DG Foot Complete Left  Result Date: 05/20/2021 CLINICAL DATA:  Swelling, erythema, history of diabetes EXAM: LEFT FOOT - COMPLETE 3+ VIEW COMPARISON:  03/12/2021 FINDINGS: Frontal, oblique, and lateral views of the left foot are obtained. There is been a previous amputation mid aspect second metatarsal. 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 There is a comminuted intra-articular fracture at the base of the first metatarsal. Suspected intra-articular fractures of the base of the second and third metatarsals. There is lateral and dorsal dislocation of the second through fifth tarsometatarsal joints, as well as dorsal displacement of the first metatarsal relative to the medial cuboid. Additionally, there is evidence of a stress or insufficiency fracture involving the third  metatarsal, with linear sclerosis and buttressing involving the third metatarsal diaphysis. There is diffuse soft tissue swelling. IMPRESSION: 1. Fracture dislocations of the first through fifth tarsometatarsal joints as above, consistent with divergent Lisfranc deformity. 2. Stress or insufficiency fracture involving the third metatarsal, new since prior study. 3. Previous second metatarsal amputation. 4. Diffuse soft tissue swelling. Electronically Signed   By: Sharlet Salina M.D.   On: 05/20/2021 21:36    Review of Systems Blood pressure (!) 110/48, pulse 69, temperature 98 F (36.7 C), resp. rate 14, height 5\' 5"  (1.651 m), weight 67.9 kg, SpO2 93 %. Physical Exam Vitals reviewed.  Constitutional:      General: She is not in acute distress.    Appearance: She is not toxic-appearing.  HENT:     Head: Normocephalic and atraumatic.  Cardiovascular:     Rate and Rhythm: Normal rate.  Musculoskeletal:     Left foot: Normal capillary refill. Deformity, Charcot foot, tenderness and bony tenderness present. No foot drop, prominent metatarsal heads or laceration. Normal pulse.       Legs:  Neurological:     Mental Status: She is alert.  Psychiatric:        Mood and Affect: Mood normal. Affect is blunt and flat.        Speech: Speech normal.  Behavior: Behavior normal. Behavior is cooperative.        Cognition and Memory: Memory is impaired.        Judgment: Judgment is not impulsive or inappropriate.    Assessment/Plan: This may be a post op wound infection with an acute Lisfranc subluxation, or a acute charcot foot.   The history of injury is unclear.  So at this point with an elevated sed rate and crp wbc I would treat the infection and see foot and ankle post admit    Fuller CanadaStanley Ainslee Sou 05/22/2021, 1:12 PM

## 2021-05-22 NOTE — Progress Notes (Signed)
Patient ID: [name], female   DOB: 1961-02-03, 60 y.o.   MRN: [id number]  Past Medical History:  Diagnosis Date   Diabetes mellitus without complication (HCC)    Hidradenitis 12/13/2015   Hypercholesterolemia    Hypertension    Smoker 12/13/2015   Prior foot surgery Dr Allena Katz 2nd toe and partial ray amp  New lisfranc changes since June

## 2021-05-22 NOTE — Plan of Care (Signed)
  Problem: Education: Goal: Knowledge of General Education information will improve Description: Including pain rating scale, medication(s)/side effects and non-pharmacologic comfort measures Outcome: Not Progressing   Problem: Health Behavior/Discharge Planning: Goal: Ability to manage health-related needs will improve Outcome: Not Progressing   Problem: Clinical Measurements: Goal: Ability to maintain clinical measurements within normal limits will improve Outcome: Progressing Goal: Will remain free from infection Outcome: Progressing Goal: Diagnostic test results will improve Outcome: Progressing Goal: Respiratory complications will improve Outcome: Progressing Goal: Cardiovascular complication will be avoided Outcome: Progressing   Problem: Activity: Goal: Risk for activity intolerance will decrease Outcome: Not Progressing   Problem: Nutrition: Goal: Adequate nutrition will be maintained Outcome: Progressing   Problem: Coping: Goal: Level of anxiety will decrease Outcome: Progressing   Problem: Elimination: Goal: Will not experience complications related to bowel motility Outcome: Progressing Goal: Will not experience complications related to urinary retention Outcome: Progressing   Problem: Pain Managment: Goal: General experience of comfort will improve Outcome: Not Progressing   Problem: Safety: Goal: Ability to remain free from injury will improve Outcome: Progressing   Problem: Skin Integrity: Goal: Risk for impaired skin integrity will decrease Outcome: Progressing

## 2021-05-22 NOTE — Progress Notes (Signed)
PROGRESS NOTE    [name]  ZOX:[id number] DOB: March 31, 1961 DOA: 05/20/2021 PCP: Avon GullyFanta, Tesfaye, MD    Brief Narrative:  60 y/o female with history of HTN, DM, presents to ED with left foot swelling. She denies any injury to this area. She was noted to have cellulitis of LLE and started on antibiotics. Plain films of left foot indicated divergent lisfranc dislocation. Orthopedics consulted.   Assessment & Plan:   Active Problems:   HTN (hypertension)   Tobacco abuse   Hyperlipidemia   DM2 (diabetes mellitus, type 2) (HCC)   Cellulitis   LLE cellulitis -currently on IV antibiotics -overall erythema improving -venous doppler negative for DVT -left ABI 0.94 -keep LE elevated  Left foot divergent lisfranc dislocation -will keep patient NWB on LLE -appreciate ortho input -patient lives alone and will need PT evaluation for safety of discharge home -will likely need dme set up at home  DM2 -continue basal insulin -continue sliding scale -blood sugars stable  HTN -currently on lisinopril and HCTZ -BP stable  HLD -continue on statin  Bipolar disorder -patient noted to have bizarre behavior this morning -she did acknowledge that she often talks to herself and her father that had passed away years ago -discussed with son and he confirms this behavior and feels as though her behavior is currently better than it has been in the past -review of records indicate that she was hospitalized for manic episode at [facility] in [facility] and was discharged on seroquel 200mg  qhs -son reports that he was unable to get this medicine, since it had reportedly not been called into her pharmacy -she does not have any established outpatient mental health provider and will need [facility] -currently we will restart seroquel and monitor mental status -if she becomes increasingly manic, she may need psychiatry evaluation   DVT prophylaxis: SCDs Start: 05/20/21 2305  Code  Status: full code Family Communication: discussed with patient's son Disposition Plan: Status is: Inpatient  Remains inpatient appropriate because:IV treatments appropriate due to intensity of illness or inability to take PO and Inpatient level of care appropriate due to severity of illness  Dispo: The patient is from: Home              Anticipated d/c is to:  TBD              Patient currently is not medically stable to d/c.   Difficult to place patient No         Consultants:  orthopedics  Procedures:    Antimicrobials:  Ceftriaxone 8/21> Flagyl 8/21>    Subjective: Staff noted that patient wanting to leave this morning. She was talking to herself and her father who was not present in the room.  Objective: Vitals:   05/21/21 1633 05/21/21 2116 05/22/21 0412 05/22/21 1300  BP: (!) 117/57 (!) 117/50 (!) 110/48 131/64  Pulse: 85 73 69 70  Resp: 20 16 14 16   Temp: 98.5 F (36.9 C) 98.7 F (37.1 C) 98 F (36.7 C) 98 F (36.7 C)  TempSrc: Oral   Oral  SpO2: 97% 100% 93% 95%  Weight: 67.9 kg     Height: 5\' 5"  (1.651 m)       Intake/Output Summary (Last 24 hours) at 05/22/2021 1940 Last data filed at 05/22/2021 1842 Gross per 24 hour  Intake 1532.5 ml  Output 1201 ml  Net 331.5 ml   Filed Weights   05/20/21 1739 05/21/21 1633  Weight: 72.6 kg 67.9 kg  Examination:  General exam: Appears calm and comfortable  Respiratory system: Clear to auscultation. Respiratory effort normal. Cardiovascular system: S1 & S2 heard, RRR. No JVD, murmurs, rubs, gallops or clicks. No pedal edema. Gastrointestinal system: Abdomen is nondistended, soft and nontender. No organomegaly or masses felt. Normal bowel sounds heard. Central nervous system: Alert and oriented. No focal neurological deficits. Extremities: left foot in cast Skin: No rashes, lesions or ulcers Psychiatry: Judgement and insight appear normal. Mood & affect appropriate.    Data Reviewed: I have  personally reviewed following labs and imaging studies  CBC: Recent Labs  Lab 05/20/21 2046 05/21/21 0449 05/22/21 0531  WBC 4.8 13.7* 7.3  NEUTROABS 2.6 10.8*  --   HGB 14.7 11.3* 9.3*  HCT 43.9 35.8* 29.0*  MCV 92.0 96.5 96.3  PLT 216 368 318   Basic Metabolic Panel: Recent Labs  Lab 05/20/21 2046 05/21/21 0449 05/22/21 0531  NA 135 138 137  K 3.6 5.1 4.7  CL 99 99 101  CO2 29 32 30  GLUCOSE 168* 114* 146*  BUN 10 10 12   CREATININE 0.78 0.79 0.82  CALCIUM 8.5* 8.4* 8.3*  MG  --  2.0  --   PHOS  --  4.1  --    GFR: Estimated Creatinine Clearance: 65.7 mL/min (by C-G formula based on SCr of 0.82 mg/dL). Liver Function Tests: Recent Labs  Lab 05/20/21 2046 05/21/21 0449  AST 14* 14*  ALT 16 17  ALKPHOS 173* 182*  BILITOT 0.2* 0.6  PROT 6.7 7.2  ALBUMIN 3.1* 3.2*   No results for input(s): LIPASE, AMYLASE in the last 168 hours. No results for input(s): AMMONIA in the last 168 hours. Coagulation Profile: No results for input(s): INR, PROTIME in the last 168 hours. Cardiac Enzymes: No results for input(s): CKTOTAL, CKMB, CKMBINDEX, TROPONINI in the last 168 hours. BNP (last 3 results) No results for input(s): PROBNP in the last 8760 hours. HbA1C: No results for input(s): HGBA1C in the last 72 hours. CBG: Recent Labs  Lab 05/21/21 1648 05/21/21 2143 05/22/21 0729 05/22/21 1117 05/22/21 1617  GLUCAP 177* 136* 142* 192* 203*   Lipid Profile: No results for input(s): CHOL, HDL, LDLCALC, TRIG, CHOLHDL, LDLDIRECT in the last 72 hours. Thyroid Function Tests: No results for input(s): TSH, T4TOTAL, FREET4, T3FREE, THYROIDAB in the last 72 hours. Anemia Panel: No results for input(s): VITAMINB12, FOLATE, FERRITIN, TIBC, IRON, RETICCTPCT in the last 72 hours. Sepsis Labs: No results for input(s): PROCALCITON, LATICACIDVEN in the last 168 hours.  Recent Results (from the past 240 hour(s))  Resp Panel by RT-PCR (Flu A&B, Covid) Nasopharyngeal Swab      Status: None   Collection Time: 05/20/21  8:46 PM   Specimen: Nasopharyngeal Swab; Nasopharyngeal(NP) swabs in vial transport medium  Result Value Ref Range Status   SARS Coronavirus 2 by RT PCR NEGATIVE NEGATIVE Final    Comment: (NOTE) SARS-CoV-2 target nucleic acids are NOT DETECTED.  The SARS-CoV-2 RNA is generally detectable in upper respiratory specimens during the acute phase of infection. The lowest concentration of SARS-CoV-2 viral copies this assay can detect is 138 copies/mL. A negative result does not preclude SARS-Cov-2 infection and should not be used as the sole basis for treatment or other patient management decisions. A negative result may occur with  improper specimen collection/handling, submission of specimen other than nasopharyngeal swab, presence of viral mutation(s) within the areas targeted by this assay, and inadequate number of viral copies(<138 copies/mL). A negative result must be combined with  clinical observations, patient history, and epidemiological information. The expected result is Negative.  Fact Sheet for Patients:  BloggerCourse.com  Fact Sheet for Healthcare Providers:  SeriousBroker.it  This test is no t yet approved or cleared by the [place] FDA and  has been authorized for detection and/or diagnosis of SARS-CoV-2 by FDA under an Emergency Use Authorization (EUA). This EUA will remain  in effect (meaning this test can be used) for the duration of the COVID-19 declaration under Section 564(b)(1) of the Act, 21 U.S.C.section 360bbb-3(b)(1), unless the authorization is terminated  or revoked sooner.       Influenza A by PCR NEGATIVE NEGATIVE Final   Influenza B by PCR NEGATIVE NEGATIVE Final    Comment: (NOTE) The Xpert Xpress SARS-CoV-2/FLU/RSV plus assay is intended as an aid in the diagnosis of influenza from Nasopharyngeal swab specimens and should not be used as a sole basis  for treatment. Nasal washings and aspirates are unacceptable for Xpert Xpress SARS-CoV-2/FLU/RSV testing.  Fact Sheet for Patients: BloggerCourse.com  Fact Sheet for Healthcare Providers: SeriousBroker.it  This test is not yet approved or cleared by the [place] FDA and has been authorized for detection and/or diagnosis of SARS-CoV-2 by FDA under an Emergency Use Authorization (EUA). This EUA will remain in effect (meaning this test can be used) for the duration of the COVID-19 declaration under Section 564(b)(1) of the Act, 21 U.S.C. section 360bbb-3(b)(1), unless the authorization is terminated or revoked.  Performed at [facility], [address] [place], [place] [address]   Culture, blood (Routine X 2) w Reflex to ID Panel     Status: None (Preliminary result)   Collection Time: 05/20/21  8:46 PM   Specimen: BLOOD RIGHT FOREARM  Result Value Ref Range Status   Specimen Description BLOOD RIGHT FOREARM  Final   Special Requests   Final    BOTTLES DRAWN AEROBIC AND ANAEROBIC Blood Culture adequate volume   Culture   Final    NO GROWTH 2 DAYS Performed at [facility], [address] [place], [place] [address]    Report Status PENDING  Incomplete  Culture, blood (Routine X 2) w Reflex to ID Panel     Status: None (Preliminary result)   Collection Time: 05/20/21  9:23 PM   Specimen: BLOOD LEFT ARM  Result Value Ref Range Status   Specimen Description BLOOD LEFT ARM  Final   Special Requests   Final    BOTTLES DRAWN AEROBIC AND ANAEROBIC Blood Culture adequate volume   Culture   Final    NO GROWTH 2 DAYS Performed at [facility], [address], [place], [place] [address]    Report Status PENDING  Incomplete         Radiology Studies: US Venous Img Lower Unilateral Left (DVT)  Result Date: 05/21/2021 CLINICAL DATA:  Left lower extremity swelling. Left second toe amputation. Left foot fractures. Diabetic  nonhealing left foot wounds. EXAM: LEFT LOWER EXTREMITY VENOUS DOPPLER ULTRASOUND TECHNIQUE: Gray-scale sonography with compression, as well as color and duplex ultrasound, were performed to evaluate the deep venous system(s) from the level of the common femoral vein through the popliteal and proximal calf veins. COMPARISON:  None. FINDINGS: VENOUS Normal compressibility of the common femoral, superficial femoral, and popliteal veins, as well as the visualized calf veins. Visualized portions of profunda femoral vein and great saphenous vein unremarkable. No filling defects to suggest DVT on grayscale or color Doppler imaging. Doppler waveforms show normal direction of venous flow, normal respiratory  plasticity and response to augmentation. Limited views of the contralateral common femoral vein are unremarkable. OTHER Soft tissue edema noted in the left leg. There are abnormal left inguinal lymph nodes, enlarged cortical thickening, but overall maintaining normal morphologic shapes. Largest node measures 4 cm in long axis, 1.5 cm in short axis and has a cortical thickness up to 9 mm. Limitations: none IMPRESSION: 1. No evidence of a left lower extremity deep venous thrombosis. 2. Left lower extremity nonspecific edema. 3. Enlarged left inguinal lymph nodes, which are presumably reactive given the reported left foot wounds. Electronically Signed   By: Amie Portland M.D.   On: 05/21/2021 11:10   US ARTERIAL ABI (SCREENING LOWER EXTREMITY)  Result Date: 05/21/2021 CLINICAL DATA:  Current smoker Hypertension Hyperlipidemia Diabetes EXAM: NONINVASIVE PHYSIOLOGIC VASCULAR STUDY OF BILATERAL LOWER EXTREMITIES TECHNIQUE: Evaluation of both lower extremities were performed at rest, including calculation of ankle-brachial indices with single level Doppler, pressure and pulse volume recording. COMPARISON:  None. FINDINGS: Right ABI:  1.16 Left ABI:  0.94 Right Lower Extremity:  Normal arterial waveforms at the ankle. Left  Lower Extremity:  Normal arterial waveforms at the ankle. 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 Electronically Signed   By: Acquanetta Belling M.D.   On: 05/21/2021 10:25   DG Foot Complete Left  Result Date: 05/20/2021 CLINICAL DATA:  Swelling, erythema, history of diabetes EXAM: LEFT FOOT - COMPLETE 3+ VIEW COMPARISON:  03/12/2021 FINDINGS: Frontal, oblique, and lateral views of the left foot are obtained. There is been a previous amputation mid aspect second metatarsal. 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 There is a comminuted intra-articular fracture at the base of the first metatarsal. Suspected intra-articular fractures of the base of the second and third metatarsals. There is lateral and dorsal dislocation of the second through fifth tarsometatarsal joints, as well as dorsal displacement of the first metatarsal relative to the medial cuboid. Additionally, there is evidence of a stress or insufficiency fracture involving the third metatarsal, with linear sclerosis and buttressing involving the third metatarsal diaphysis. There is diffuse soft tissue swelling. IMPRESSION: 1. Fracture dislocations of the first through fifth tarsometatarsal joints as above, consistent with divergent Lisfranc deformity. 2. Stress or insufficiency fracture involving the third metatarsal, new since prior study. 3. Previous second metatarsal amputation. 4. Diffuse soft tissue swelling. Electronically Signed   By: Sharlet Salina M.D.   On: 05/20/2021 21:36        Scheduled Meds:  (feeding supplement) PROSource Plus  30 mL Oral BID BM   atorvastatin  40 mg Oral Daily   gabapentin  100 mg Oral TID   lisinopril  10 mg Oral Daily   And   hydrochlorothiazide  12.5 mg Oral Daily   insulin aspart  0-15 Units Subcutaneous TID WC   insulin aspart  0-5 Units Subcutaneous QHS   insulin glargine-yfgn  10 Units Subcutaneous  QHS   nicotine  21 mg Transdermal Daily   oxybutynin  10 mg Oral QHS   QUEtiapine  200 mg Oral QHS   Continuous Infusions:  cefTRIAXone (ROCEPHIN)  IV 2 g (05/22/21 0543)   And   metronidazole 500 mg (05/22/21 1707)     LOS: 2 days    Time spent:    Erick Blinks, MD [facility]   If 7PM-7AM, please contact night-coverage [email or web address]  05/22/2021, 7:40 PM

## 2021-05-23 DIAGNOSIS — L03116 Cellulitis of left lower limb: Secondary | ICD-10-CM | POA: Diagnosis not present

## 2021-05-23 DIAGNOSIS — I10 Essential (primary) hypertension: Secondary | ICD-10-CM | POA: Diagnosis not present

## 2021-05-23 DIAGNOSIS — E11621 Type 2 diabetes mellitus with foot ulcer: Secondary | ICD-10-CM | POA: Diagnosis not present

## 2021-05-23 DIAGNOSIS — S92902A Unspecified fracture of left foot, initial encounter for closed fracture: Secondary | ICD-10-CM | POA: Diagnosis not present

## 2021-05-23 LAB — GLUCOSE, CAPILLARY
Glucose-Capillary: 101 mg/dL — ABNORMAL HIGH (ref 70–99)
Glucose-Capillary: 240 mg/dL — ABNORMAL HIGH (ref 70–99)
Glucose-Capillary: 172 mg/dL — ABNORMAL HIGH (ref 70–99)
Glucose-Capillary: 189 mg/dL — ABNORMAL HIGH (ref 70–99)

## 2021-05-23 LAB — CBC
HCT: 33.5 % — ABNORMAL LOW (ref 36.0–46.0)
Hemoglobin: 10.6 g/dL — ABNORMAL LOW (ref 12.0–15.0)
MCH: 30.6 pg (ref 26.0–34.0)
MCHC: 31.6 g/dL (ref 30.0–36.0)
MCV: 96.8 fL (ref 80.0–100.0)
Platelets: 344 10*3/uL (ref 150–400)
RBC: 3.46 MIL/uL — ABNORMAL LOW (ref 3.87–5.11)
RDW: 12.4 % (ref 11.5–15.5)
WBC: 7.9 10*3/uL (ref 4.0–10.5)
nRBC: 0 % (ref 0.0–0.2)

## 2021-05-23 NOTE — Evaluation (Signed)
Physical Therapy Evaluation Patient Details Name: [name] MRN: [id number] DOB: September 11, 1961 Today's Date: 05/23/2021   History of Present Illness  [name] is a 60 y.o. female presents to the ED with a chief complaint of left foot swelling. X-ray shows Lisfranc dislocation. Ortho consult note doesn't specify WB status. PMH: diabetes, HLD, HTN, Tobacco abuse,  s/p 2nd toe and partial ray resection in June  Clinical Impression  Pt admitted with above diagnosis. Pt difficult to follow, tangential and impulsive while speaking to therapist. Pt currently requires mod A to power to stand and unable to take steps. Significant education on NWB LLE and pt unable to verbalize but does demo good adherence with L leg suspended off ground. Unsure of pt's DME at home or assistance availability and no family to present. Adhered to NWB LLE status per hospitalist note, ortho consult doesn't specify. Pt currently with functional limitations due to the deficits listed below (see PT Problem List). Pt will benefit from skilled PT to increase their independence and safety with mobility to allow discharge to the venue listed below.       Follow Up Recommendations SNF    Equipment Recommendations  Other (comment) (TBD)    Recommendations for Other Services       Precautions / Restrictions Precautions Precautions: Fall Restrictions Weight Bearing Restrictions: Yes LLE Weight Bearing: Non weight bearing (per PT EVAL order and hospitalist note; can't find official WB status order on file- notified MD)      Mobility  Bed Mobility Overal bed mobility: Needs Assistance Bed Mobility: Supine to Sit;Sit to Supine  Supine to sit: Supervision Sit to supine: Supervision  General bed mobility comments: increased time, supv for safety    Transfers Overall transfer level: Needs assistance Equipment used: Rolling walker (2 wheeled) Transfers: Sit to/from Stand Sit to Stand: Mod assist  General  transfer comment: mod A to power up on RLE only, maintains L foot off of floor  Ambulation/Gait  General Gait Details: Attempted hop through pattern but pt unable to lift RLE off of floor and bear weight through UEs, heavy education on NWB LLE and pt insistent that she can walk on heel but also adheres to LLE NWB with L foot suspended off of floor  Stairs            Wheelchair Mobility    Modified Rankin (Stroke Patients Only)       Balance Overall balance assessment: Mild deficits observed, not formally tested        Pertinent Vitals/Pain Pain Assessment: No/denies pain    Home Living Family/patient expects to be discharged to:: Private residence Living Arrangements: Alone Available Help at Discharge: Family;Available PRN/intermittently Type of Home: House Home Access: Stairs to enter   [employer] of Steps: 3 Home Layout: One level Home Equipment:  (TBD)      Prior Function Level of Independence: Independent         Comments: Pt erradic and tangential, difficult to follow PLOF and DME. States she had a RW and w/c but her son took them? Pt states she was independent but also had a caregiver 1 hour/day Monday-Friday- no family present to confirm.     Hand Dominance        Extremity/Trunk Assessment   Upper Extremity Assessment Upper Extremity Assessment: Overall WFL for tasks assessed    Lower Extremity Assessment Lower Extremity Assessment: RLE deficits/detail;LLE deficits/detail;Generalized weakness RLE Deficits / Details: AROM WNL, strength 3+/5 throughout, denies numbness/tingling LLE Deficits /  Details: hip and knee AROM WNL and strength 3+/5, ankle NT    Cervical / Trunk Assessment Cervical / Trunk Assessment: Normal  Communication      Cognition Arousal/Alertness: Awake/alert Behavior During Therapy: Restless;Impulsive Overall Cognitive Status: No family/caregiver present to determine baseline cognitive functioning  General  Comments: Pt able to state name, date and location appropriately, unaware of why in hospital. Pt tangential with speech, requires redirection often, impulsive with mobility requiring max verbal cues for safety. Pt educated on NWB LLE status then asked to repeat to therapist and pt states "I can walk on my heel"- continued to educate pt.      General Comments      Exercises     Assessment/Plan    PT Assessment Patient needs continued PT services  PT Problem List Decreased strength;Decreased activity tolerance;Decreased balance;Decreased cognition;Decreased knowledge of use of DME;Cardiopulmonary status limiting activity       PT Treatment Interventions DME instruction;Gait training;Functional mobility training;Therapeutic activities;Therapeutic exercise;Balance training;Patient/family education;Stair training    PT Goals (Current goals can be found in the Care Plan section)  Acute Rehab PT Goals Patient Stated Goal: "my son dropped me off here" PT Goal Formulation: With patient Time For Goal Achievement: 06/06/21 Potential to Achieve Goals: Good    Frequency Min 3X/week   Barriers to discharge        Co-evaluation               AM-PAC PT "6 Clicks" Mobility  Outcome Measure Help needed turning from your back to your side while in a flat bed without using bedrails?: A Little Help needed moving from lying on your back to sitting on the side of a flat bed without using bedrails?: A Little Help needed moving to and from a bed to a chair (including a wheelchair)?: A Lot Help needed standing up from a chair using your arms (e.g., wheelchair or bedside chair)?: A Lot Help needed to walk in hospital room?: Total Help needed climbing 3-5 steps with a railing? : Total 6 Click Score: 12    End of Session Equipment Utilized During Treatment: Gait belt Activity Tolerance: Patient tolerated treatment well Patient left: in bed;with call bell/phone within reach;with bed alarm  set;with nursing/sitter in room Nurse Communication: Mobility status;Weight bearing status PT Visit Diagnosis: Unsteadiness on feet (R26.81);Other abnormalities of gait and mobility (R26.89);Muscle weakness (generalized) (M62.81);Difficulty in walking, not elsewhere classified (R26.2)    Time: 4259-5638 PT Time Calculation (min) (ACUTE ONLY): 19 min   Charges:   PT Evaluation $PT Eval Moderate Complexity: 1 Mod           Tori Daelin Haste PT, DPT 05/23/21, 12:08 PM

## 2021-05-23 NOTE — TOC Initial Note (Signed)
Transition of Care [facility]) - Initial/Assessment Note    Patient Details  Name: [name] MRN: [id number] Date of Birth: Jan 31, 1961  Transition of Care [facility]) CM/SW Contact:    Barry Brunner, LCSW Phone Number: 05/23/2021, 4:20 PM  Clinical Narrative:                 Patient is a 60 year old female admitted for Cellulitis. CSW observed patient's high readmission score. CSW conducted risk assessment and intitial assessment. CSW inquire about patient's agreeableness to SNF. Patient reported that she preferred to go home stated that her son would look after her. CSW contacted patient's son. Patient's son reported that he could assist patient with ADL for home discharge with [facility]. Patient's son requested a shower chair and wheelchair. CSW started parachute order for equipment MD reported that there were concerns about patient's ability to care for herself without 24 hour supervision and express that a psych eval would be entered to evaluate if she had capacity. MD also expressed that son is agreeable to [facility], but believed SNF would be more appropriate. Patient will need [facility] referral if discharging home with [facility] due to psyciatric needs. TOC to follow.   Expected Discharge Plan: Skilled Nursing Facility Barriers to Discharge: Continued Medical Work up   Patient Goals and CMS Choice Patient states their goals for this hospitalization and ongoing recovery are:: Return home with [facility] [place] CMS Medicare.gov Compare Post Acute Care list provided to:: Patient    Expected Discharge Plan and Services Expected Discharge Plan: Skilled Nursing Facility                                              Prior Living Arrangements/Services   Lives with:: Self, Adult Children Patient language and need for interpreter reviewed:: Yes        Need for Family Participation in Patient Care: Yes (Comment) Care giver support system in place?: Yes (comment)   Criminal Activity/Legal Involvement Pertinent to  Current Situation/Hospitalization: No - Comment as needed  Activities of Daily Living Home Assistive Devices/Equipment: Cane (specify quad or straight) ADL Screening (condition at time of admission) Patient's cognitive ability adequate to safely complete daily activities?: Yes Is the patient deaf or have difficulty hearing?: No Does the patient have difficulty seeing, even when wearing glasses/contacts?: No Does the patient have difficulty concentrating, remembering, or making decisions?: Yes Patient able to express need for assistance with ADLs?: Yes Does the patient have difficulty dressing or bathing?: No Independently performs ADLs?: Yes (appropriate for developmental age) Does the patient have difficulty walking or climbing stairs?: No Weakness of Legs: None Weakness of Arms/Hands: None  Permission Sought/Granted      Share Information with NAME: Denny,Wayne  Permission granted to share info w AGENCY: Local HH  Permission granted to share info w Relationship: (Son)  Permission granted to share info w Contact Information: [phone]  Emotional Assessment       Orientation: : Oriented to Self, Oriented to Place Alcohol / Substance Use: Not Applicable Psych Involvement: Yes (comment)  Admission diagnosis:  Swelling [R60.9] Cellulitis [L03.90] Cellulitis of left lower extremity [L03.116] Left leg swelling [M79.89] Multiple closed fractures of left foot, initial encounter [S92.902A] Patient Active Problem List   Diagnosis Date Noted   Cellulitis 05/20/2021   Diabetic infection of left foot (HCC) 03/10/2021   DM2 (diabetes mellitus, type 2) (HCC)  03/10/2021   Diabetic ulcer of left foot (HCC) 03/10/2021   Mixed stress and urge urinary incontinence 02/02/2021   Encounter for screening fecal occult blood testing 02/02/2021   Routine general medical examination at a health care facility 02/02/2021   Polyneuropathy in diabetes (HCC) 03/26/2020   Cellulitis of left lower  extremity    Osteomyelitis (HCC) 03/25/2020   Closed fracture of right proximal humerus 10/08/18 10/14/2018   Special screening for malignant neoplasms, colon    Hidradenitis 12/13/2015   Smoker 12/13/2015   Bipolar I disorder, most recent episode (or current) manic (HCC) 04/01/2015   Unspecified constipation 06/26/2013   Nausea and vomiting 06/25/2013   Cellulitis and abscess of foot 06/23/2013   DM (diabetes mellitus) (HCC) 06/23/2013   HTN (hypertension) 06/23/2013   Tobacco abuse 06/23/2013   Hyperlipidemia 06/23/2013   PCP:  Avon Gully, MD Pharmacy:   [facility] [place], [place] - [address] [place] [address] Phone: [phone] Fax: [phone]     Social Determinants of Health (SDOH) Interventions    Readmission Risk Interventions Readmission Risk Prevention Plan 05/23/2021  Transportation Screening Complete  HRI or Home Care Consult Complete  Social Work Consult for Recovery Care Planning/Counseling Complete  Palliative Care Screening Not Applicable  Medication Review Oceanographer) Complete  Some recent data might be hidden

## 2021-05-23 NOTE — Progress Notes (Signed)
CSW informed that Medical provider is requesting medical capacity evaluation. CSW will assist and follow to meet patient's needs.    Maryjean Ka, MSW, LCSWA 05/23/2021 8:16 PM

## 2021-05-23 NOTE — NC FL2 (Signed)
[place] MEDICAID FL2 LEVEL OF CARE SCREENING TOOL     IDENTIFICATION  Patient Name: [name] Birthdate: [birth year] Sex: female Admission Date (Current Location): 05/20/2021  [place] and [place] Number:  Aaron Edelman [id number] [facility] Facility and Address:  [facility],  [address] [place] [address]      Provider Number: [id number]  Attending Physician Name and Address:  Erick Blinks, MD  Relative Name and Phone Number:  Theodoro Parma)   [phone]    Current Level of Care: Hospital Recommended Level of Care: Skilled Nursing Facility Prior Approval Number:    Date Approved/Denied:   PASRR Number: Pending  Discharge Plan: SNF    Current Diagnoses: Patient Active Problem List   Diagnosis Date Noted   Cellulitis 05/20/2021   Diabetic infection of left foot (HCC) 03/10/2021   DM2 (diabetes mellitus, type 2) (HCC) 03/10/2021   Diabetic ulcer of left foot (HCC) 03/10/2021   Mixed stress and urge urinary incontinence 02/02/2021   Encounter for screening fecal occult blood testing 02/02/2021   Routine general medical examination at a health care facility 02/02/2021   Polyneuropathy in diabetes (HCC) 03/26/2020   Cellulitis of left lower extremity    Osteomyelitis (HCC) 03/25/2020   Closed fracture of right proximal humerus 10/08/18 10/14/2018   Special screening for malignant neoplasms, colon    Hidradenitis 12/13/2015   Smoker 12/13/2015   Bipolar I disorder, most recent episode (or current) manic (HCC) 04/01/2015   Unspecified constipation 06/26/2013   Nausea and vomiting 06/25/2013   Cellulitis and abscess of foot 06/23/2013   DM (diabetes mellitus) (HCC) 06/23/2013   HTN (hypertension) 06/23/2013   Tobacco abuse 06/23/2013   Hyperlipidemia 06/23/2013    Orientation RESPIRATION BLADDER Height & Weight     Self, Place  Normal Incontinent Weight: 149 lb 11.1 oz (67.9 kg) Height:  5\' 5"  (165.1 cm)  BEHAVIORAL SYMPTOMS/MOOD NEUROLOGICAL  BOWEL NUTRITION STATUS      Continent Diet (Diet heart healthy/carb modified Room service appropriate? Yes; Fluid consistency: Thin)  AMBULATORY STATUS COMMUNICATION OF NEEDS Skin   Extensive Assist Verbally Other (Comment) (Abrasion Right Knee Cellulitis Left foot)                       Personal Care Assistance Level of Assistance  Bathing, Feeding, Dressing Bathing Assistance: Maximum assistance Feeding assistance: Independent Dressing Assistance: Maximum assistance     Functional Limitations Info  Sight, Hearing, Speech Sight Info: Adequate Hearing Info: Adequate Speech Info: Adequate    SPECIAL CARE FACTORS FREQUENCY  PT (By licensed PT)     PT Frequency: 5x              Contractures Contractures Info: Not present    Additional Factors Info  Code Status, Allergies, Psychotropic, Insulin Sliding Scale Code Status Info: Full Allergies Info: n/a Psychotropic Info: gabapentin Insulin Sliding Scale Info: CBG 70 - 120: 0 units   CBG 121 - 150: 0 units   CBG 151 - 200: 0 units   CBG 201 - 250: 2 units   CBG 251 - 300: 3 units   CBG 301 - 350: 4 units   CBG 351 - 400: 5 units   CBG > 400 call MD and obtain STAT lab verification       Current Medications (05/23/2021):  This is the current hospital active medication list Current Facility-Administered Medications  Medication Dose Route Frequency Provider Last Rate Last Admin   (feeding supplement) PROSource Plus liquid 30 mL  30 mL Oral BID BM Erick Blinks, MD   30 mL at 05/23/21 1628   acetaminophen (TYLENOL) tablet 650 mg  650 mg Oral Q6H PRN Zierle-Ghosh, Asia B, DO   650 mg at 05/23/21 1628   Or   acetaminophen (TYLENOL) suppository 650 mg  650 mg Rectal Q6H PRN Zierle-Ghosh, Asia B, DO       albuterol (PROVENTIL) (2.5 MG/3ML) 0.083% nebulizer solution 2.5 mg  2.5 mg Nebulization Q4H PRN Zierle-Ghosh, Asia B, DO       atorvastatin (LIPITOR) tablet 40 mg  40 mg Oral Daily Zierle-Ghosh, Asia B, DO   40 mg at  05/23/21 0846   cefTRIAXone (ROCEPHIN) 2 g in sodium chloride 0.9 % 100 mL IVPB  2 g Intravenous Q24H Zierle-Ghosh, Asia B, DO 200 mL/hr at 05/23/21 0547 2 g at 05/23/21 0547   And   metroNIDAZOLE (FLAGYL) IVPB 500 mg  500 mg Intravenous Q12H Zierle-Ghosh, Asia B, DO 100 mL/hr at 05/23/21 0624 500 mg at 05/23/21 2694   gabapentin (NEURONTIN) capsule 100 mg  100 mg Oral TID Zierle-Ghosh, Asia B, DO   100 mg at 05/23/21 1628   haloperidol lactate (HALDOL) injection 5 mg  5 mg Intravenous Q6H PRN Erick Blinks, MD   5 mg at 05/22/21 0751   lisinopril (ZESTRIL) tablet 10 mg  10 mg Oral Daily Erick Blinks, MD   10 mg at 05/23/21 8546   And   hydrochlorothiazide (MICROZIDE) capsule 12.5 mg  12.5 mg Oral Daily Erick Blinks, MD   12.5 mg at 05/23/21 0845   insulin aspart (novoLOG) injection 0-15 Units  0-15 Units Subcutaneous TID WC Zierle-Ghosh, Asia B, DO   3 Units at 05/23/21 1629   insulin aspart (novoLOG) injection 0-5 Units  0-5 Units Subcutaneous QHS Zierle-Ghosh, Asia B, DO   3 Units at 05/22/21 2139   insulin glargine-yfgn (SEMGLEE) injection 10 Units  10 Units Subcutaneous QHS Zierle-Ghosh, Asia B, DO   10 Units at 05/22/21 2140   morphine 2 MG/ML injection 2 mg  2 mg Intravenous Q2H PRN Zierle-Ghosh, Asia B, DO       nicotine (NICODERM CQ - dosed in mg/24 hours) patch 21 mg  21 mg Transdermal Daily Zierle-Ghosh, Asia B, DO   21 mg at 05/22/21 0935   ondansetron (ZOFRAN) tablet 4 mg  4 mg Oral Q6H PRN Zierle-Ghosh, Asia B, DO       Or   ondansetron (ZOFRAN) injection 4 mg  4 mg Intravenous Q6H PRN Zierle-Ghosh, Asia B, DO       oxybutynin (DITROPAN-XL) 24 hr tablet 10 mg  10 mg Oral QHS Zierle-Ghosh, Asia B, DO   10 mg at 05/22/21 2132   oxyCODONE (Oxy IR/ROXICODONE) immediate release tablet 5 mg  5 mg Oral Q4H PRN Zierle-Ghosh, Asia B, DO   5 mg at 05/22/21 2703   oxyCODONE-acetaminophen (PERCOCET/ROXICET) 5-325 MG per tablet 1-2 tablet  1-2 tablet Oral Q4H PRN Zierle-Ghosh, Asia B, DO        QUEtiapine (SEROQUEL) tablet 200 mg  200 mg Oral QHS Erick Blinks, MD   200 mg at 05/22/21 2132     Discharge Medications: Please see discharge summary for a list of discharge medications.  Relevant Imaging Results:  Relevant Lab Results:   Additional Information Pt SSN: [id number]  Barry Brunner, LCSW

## 2021-05-23 NOTE — Progress Notes (Signed)
PROGRESS NOTE    Alyssa PlyBarbara A Teater  ZOX:[id number] DOB: 1961/08/30 DOA: 05/20/2021 PCP: Avon GullyFanta, Tesfaye, MD    Brief Narrative:  60 y/o female with history of HTN, DM, presents to ED with left foot swelling. She denies any injury to this area. She was noted to have cellulitis of LLE and started on antibiotics. Plain films of left foot indicated divergent lisfranc dislocation. Orthopedics consulted.  Management will be nonoperative at this time.  Seen by PT with recommendations for skilled nursing facility placement.  She has a history of bipolar and has been restarted on Seroquel.  She does have signs of mania and is refusing skilled nursing facility placement.  Psychiatry consulted to help determine capacity as well as medication adjustments for bipolar.   Assessment & Plan:   Active Problems:   HTN (hypertension)   Tobacco abuse   Hyperlipidemia   DM2 (diabetes mellitus, type 2) (HCC)   Cellulitis   LLE cellulitis -currently on IV antibiotics -overall erythema improving -venous doppler negative for DVT -left ABI 0.94 -keep LE elevated -Can likely transition to oral antibiotics in a.m.  Left foot divergent lisfranc dislocation -will keep patient NWB on LLE -appreciate ortho input -patient lives alone and will need PT evaluation for safety of discharge home -Seen by physical therapy with recommendations for skilled nursing facility placement -After discharge, patient will follow up with her podiatrist Dr. Allena KatzPatel -Dr. Romeo AppleHarrison will reevaluate the patient to determine most appropriate brace/cast for patient  DM2 -continue basal insulin -continue sliding scale -blood sugars stable  HTN -currently on lisinopril and HCTZ -BP stable  HLD -continue on statin  Bipolar disorder -patient noted to have bizarre behavior this morning -she did acknowledge that she often talks to herself and her father that had passed away years ago -discussed with son and he confirms this behavior  and feels as though her behavior is currently better than it has been in the past -review of records indicate that [facility] and was discharged on seroquel 200mg  qhs -son reports that he was unable to get this medicine, since it had reportedly not been called into her pharmacy -she does not have any established outpatient mental health provider and will need to referred to [facility] -Seroquel was restarted on 8/22 -She continues to show signs of mania and is refusing skilled nursing facility placement -It is unclear whether she truly has capacity to understand the risks of going home without adequate support -We will request psychiatry evaluation to help determine if patient has capacity to make decisions around her discharge disposition, also if there are any medication recommendations for bipolar   DVT prophylaxis: SCDs Start: 05/20/21 2305  Code Status: full code Family Communication: discussed with patient's son Disposition Plan: Status is: Inpatient  Remains inpatient appropriate because:IV treatments appropriate due to intensity of illness or inability to take PO and Inpatient level of care appropriate due to severity of illness  Dispo: The patient is from: Home              Anticipated d/c is to:  TBD              Patient currently is not medically stable to d/c.   Difficult to place patient No         Consultants:  Orthopedics Psychiatry  Procedures:    Antimicrobials:  Ceftriaxone 8/21> Flagyl 8/21>    Subjective: Patient is sitting up in bed.  On my arrival, she  immediately states she wants to go home.  I sat down with her and recommended skilled nursing facility per PT recommendations.  She says she is agreeable to go to SNF.  Objective: Vitals:   05/22/21 1300 05/22/21 2140 05/23/21 0503 05/23/21 1346  BP: 131/64 134/64 (!) 158/75 (!) 159/77  Pulse: 70 72 79 79  Resp: 16 15 18 20   Temp: 98 F (36.7 C) 98 F  (36.7 C) 97.6 F (36.4 C) 98 F (36.7 C)  TempSrc: Oral  Oral Oral  SpO2: 95% 97% 97% 98%  Weight:      Height:        Intake/Output Summary (Last 24 hours) at 05/23/2021 1806 Last data filed at 05/23/2021 1721 Gross per 24 hour  Intake 900 ml  Output 4400 ml  Net -3500 ml   Filed Weights   05/20/21 1739 05/21/21 1633  Weight: 72.6 kg 67.9 kg    Examination:  General exam: Alert, awake, oriented x 3 Respiratory system: Clear to auscultation. Respiratory effort normal. Cardiovascular system:RRR. No murmurs, rubs, gallops. Gastrointestinal system: Abdomen is nondistended, soft and nontender. No organomegaly or masses felt. Normal bowel sounds heard. Central nervous system: Alert and oriented. No focal neurological deficits. Extremities: No C/C/E, +pedal pulses Skin: No rashes, lesions or ulcers Psychiatry: Speech is pressured and tangential at times    Data Reviewed: I have personally reviewed following labs and imaging studies  CBC: Recent Labs  Lab 05/20/21 2046 05/21/21 0449 05/22/21 0531 05/23/21 0603  WBC 4.8 13.7* 7.3 7.9  NEUTROABS 2.6 10.8*  --   --   HGB 14.7 11.3* 9.3* 10.6*  HCT 43.9 35.8* 29.0* 33.5*  MCV 92.0 96.5 96.3 96.8  PLT 216 368 318 344   Basic Metabolic Panel: Recent Labs  Lab 05/20/21 2046 05/21/21 0449 05/22/21 0531  NA 135 138 137  K 3.6 5.1 4.7  CL 99 99 101  CO2 29 32 30  GLUCOSE 168* 114* 146*  BUN 10 10 12   CREATININE 0.78 0.79 0.82  CALCIUM 8.5* 8.4* 8.3*  MG  --  2.0  --   PHOS  --  4.1  --    GFR: Estimated Creatinine Clearance: 65.7 mL/min (by C-G formula based on SCr of 0.82 mg/dL). Liver Function Tests: Recent Labs  Lab 05/20/21 2046 05/21/21 0449  AST 14* 14*  ALT 16 17  ALKPHOS 173* 182*  BILITOT 0.2* 0.6  PROT 6.7 7.2  ALBUMIN 3.1* 3.2*   No results for input(s): LIPASE, AMYLASE in the last 168 hours. No results for input(s): AMMONIA in the last 168 hours. Coagulation Profile: No results for  input(s): INR, PROTIME in the last 168 hours. Cardiac Enzymes: No results for input(s): CKTOTAL, CKMB, CKMBINDEX, TROPONINI in the last 168 hours. BNP (last 3 results) No results for input(s): PROBNP in the last 8760 hours. HbA1C: No results for input(s): HGBA1C in the last 72 hours. CBG: Recent Labs  Lab 05/22/21 1617 05/22/21 2136 05/23/21 0747 05/23/21 1146 05/23/21 1609  GLUCAP 203* 263* 101* 240* 172*   Lipid Profile: No results for input(s): CHOL, HDL, LDLCALC, TRIG, CHOLHDL, LDLDIRECT in the last 72 hours. Thyroid Function Tests: No results for input(s): TSH, T4TOTAL, FREET4, T3FREE, THYROIDAB in the last 72 hours. Anemia Panel: No results for input(s): VITAMINB12, FOLATE, FERRITIN, TIBC, IRON, RETICCTPCT in the last 72 hours. Sepsis Labs: No results for input(s): PROCALCITON, LATICACIDVEN in the last 168 hours.  Recent Results (from the past 240 hour(s))  Resp Panel by RT-PCR (Flu  A&B, Covid) Nasopharyngeal Swab     Status: None   Collection Time: 05/20/21  8:46 PM   Specimen: Nasopharyngeal Swab; Nasopharyngeal(NP) swabs in vial transport medium  Result Value Ref Range Status   SARS Coronavirus 2 by RT PCR NEGATIVE NEGATIVE Final    Comment: (NOTE) SARS-CoV-2 target nucleic acids are NOT DETECTED.  The SARS-CoV-2 RNA is generally detectable in upper respiratory specimens during the acute phase of infection. The lowest concentration of SARS-CoV-2 viral copies this assay can detect is 138 copies/mL. A negative result does not preclude SARS-Cov-2 infection and should not be used as the sole basis for treatment or other patient management decisions. A negative result may occur with  improper specimen collection/handling, submission of specimen other than nasopharyngeal swab, presence of viral mutation(s) within the areas targeted by this assay, and inadequate number of viral copies(<138 copies/mL). A negative result must be combined with clinical observations,  patient history, and epidemiological information. The expected result is Negative.  Fact Sheet for Patients:  BloggerCourse.com  Fact Sheet for Healthcare Providers:  SeriousBroker.it  This test is no t yet approved or cleared by the [place] FDA and  has been authorized for detection and/or diagnosis of SARS-CoV-2 by FDA under an Emergency Use Authorization (EUA). This EUA will remain  in effect (meaning this test can be used) for the duration of the COVID-19 declaration under Section 564(b)(1) of the Act, 21 U.S.C.section 360bbb-3(b)(1), unless the authorization is terminated  or revoked sooner.       Influenza A by PCR NEGATIVE NEGATIVE Final   Influenza B by PCR NEGATIVE NEGATIVE Final    Comment: (NOTE) The Xpert Xpress SARS-CoV-2/FLU/RSV plus assay is intended as an aid in the diagnosis of influenza from Nasopharyngeal swab specimens and should not be used as a sole basis for treatment. Nasal washings and aspirates are unacceptable for Xpert Xpress SARS-CoV-2/FLU/RSV testing.  Fact Sheet for Patients: BloggerCourse.com  Fact Sheet for Healthcare Providers: SeriousBroker.it  This test is not yet approved or cleared by the [place] FDA and has been authorized for detection and/or diagnosis of SARS-CoV-2 by FDA under an Emergency Use Authorization (EUA). This EUA will remain in effect (meaning this test can be used) for the duration of the COVID-19 declaration under Section 564(b)(1) of the Act, 21 U.S.C. section 360bbb-3(b)(1), unless the authorization is terminated or revoked.  Performed at [facility], [address] [place], [place] [address]   Culture, blood (Routine X 2) w Reflex to ID Panel     Status: None (Preliminary result)   Collection Time: 05/20/21  8:46 PM   Specimen: BLOOD RIGHT FOREARM  Result Value Ref Range Status   Specimen Description  BLOOD RIGHT FOREARM  Final   Special Requests   Final    BOTTLES DRAWN AEROBIC AND ANAEROBIC Blood Culture adequate volume   Culture   Final    NO GROWTH 3 DAYS Performed at [facility], [address] [place], [place] [address]    Report Status PENDING  Incomplete  Culture, blood (Routine X 2) w Reflex to ID Panel     Status: None (Preliminary result)   Collection Time: 05/20/21  9:23 PM   Specimen: BLOOD LEFT ARM  Result Value Ref Range Status   Specimen Description BLOOD LEFT ARM  Final   Special Requests   Final    BOTTLES DRAWN AEROBIC AND ANAEROBIC Blood Culture adequate volume   Culture   Final    NO GROWTH 3 DAYS Performed at  [facility], [address] [place], [place] [address]    Report Status PENDING  Incomplete         Radiology Studies: No results found.      Scheduled Meds:  (feeding supplement) PROSource Plus  30 mL Oral BID BM   atorvastatin  40 mg Oral Daily   gabapentin  100 mg Oral TID   lisinopril  10 mg Oral Daily   And   hydrochlorothiazide  12.5 mg Oral Daily   insulin aspart  0-15 Units Subcutaneous TID WC   insulin aspart  0-5 Units Subcutaneous QHS   insulin glargine-yfgn  10 Units Subcutaneous QHS   nicotine  21 mg Transdermal Daily   oxybutynin  10 mg Oral QHS   QUEtiapine  200 mg Oral QHS   Continuous Infusions:  cefTRIAXone (ROCEPHIN)  IV 2 g (05/23/21 0547)   And   metronidazole 500 mg (05/23/21 1738)     LOS: 3 days    Time spent:    Erick Blinks, MD [facility]   If 7PM-7AM, please contact night-coverage [email or web address]  05/23/2021, 6:06 PM

## 2021-05-23 NOTE — Plan of Care (Signed)
  Problem: Acute Rehab PT Goals(only PT should resolve) Goal: Pt Will Go Supine/Side To Sit Outcome: Progressing Flowsheets (Taken 05/23/2021 1213) Pt will go Supine/Side to Sit: with modified independence Goal: Pt Will Go Sit To Supine/Side Outcome: Progressing Flowsheets (Taken 05/23/2021 1213) Pt will go Sit to Supine/Side: with modified independence Goal: Patient Will Transfer Sit To/From Stand Outcome: Progressing Flowsheets (Taken 05/23/2021 1213) Patient will transfer sit to/from stand: with minimal assist Goal: Pt Will Transfer Bed To Chair/Chair To Bed Outcome: Progressing Flowsheets (Taken 05/23/2021 1213) Pt will Transfer Bed to Chair/Chair to Bed: with min assist Goal: Pt Will Ambulate Outcome: Progressing Flowsheets (Taken 05/23/2021 1213) Pt will Ambulate:  15 feet  with moderate assist  with rolling walker Goal: Pt/caregiver will Perform Home Exercise Program Outcome: Progressing Flowsheets (Taken 05/23/2021 1213) Pt/caregiver will Perform Home Exercise Program:  For increased ROM  For increased strengthening  For improved balance  With Supervision, verbal cues required/provided   Domenick Bookbinder PT, DPT 05/23/21, 12:14 PM

## 2021-05-24 ENCOUNTER — Encounter: Payer: Medicaid Other | Admitting: Podiatry

## 2021-05-24 DIAGNOSIS — I10 Essential (primary) hypertension: Secondary | ICD-10-CM | POA: Diagnosis not present

## 2021-05-24 DIAGNOSIS — L03116 Cellulitis of left lower limb: Secondary | ICD-10-CM | POA: Diagnosis not present

## 2021-05-24 LAB — GLUCOSE, CAPILLARY
Glucose-Capillary: 135 mg/dL — ABNORMAL HIGH (ref 70–99)
Glucose-Capillary: 248 mg/dL — ABNORMAL HIGH (ref 70–99)
Glucose-Capillary: 285 mg/dL — ABNORMAL HIGH (ref 70–99)

## 2021-05-24 NOTE — Progress Notes (Signed)
PROGRESS NOTE    [name]  MVH:[id number] DOB: [birth year] DOA: 05/20/2021 PCP: Avon GullyFanta, Tesfaye, MD   Brief Narrative:   60 y/o female with history of HTN, DM, presents to ED with left foot swelling. She denies any injury to this area. She was noted to have cellulitis of LLE and started on antibiotics. Plain films of left foot indicated divergent lisfranc dislocation. Orthopedics consulted.  Management will be nonoperative at this time.  Seen by PT with recommendations for skilled nursing facility placement.  She has a history of bipolar and has been restarted on Seroquel.  She does have signs of mania and is refusing skilled nursing facility placement.  Psychiatry consulted to help determine capacity as well as medication adjustments for bipolar.  Assessment & Plan:   Active Problems:   HTN (hypertension)   Tobacco abuse   Hyperlipidemia   DM2 (diabetes mellitus, type 2) (HCC)   Cellulitis   LLE cellulitis -currently on IV antibiotics until discharge -overall erythema improving -venous doppler negative for DVT -left ABI 0.94 -keep LE elevated   Left foot divergent lisfranc dislocation -will keep patient NWB on LLE -appreciate ortho input -patient lives alone and will need PT evaluation for safety of discharge home -Seen by physical therapy with recommendations for skilled nursing facility placement -After discharge, patient will follow up with her podiatrist Dr. Allena KatzPatel -Patient will follow-up with [facility] in outpatient setting per Dr. Romeo AppleHarrison   DM2 -continue basal insulin -continue sliding scale -blood sugars stable   HTN -currently on lisinopril and HCTZ -BP stable   HLD -continue on statin   Bipolar disorder -patient noted to have bizarre behavior this morning -she did acknowledge that she often talks to herself and her father that had passed away years ago -discussed with son and he confirms this behavior and feels as though her behavior is  currently better than it has been in the past -review of records indicate that she was hospitalized for manic episode at [facility] in 03/2021 and was discharged on seroquel 200mg  qhs -son reports that he was unable to get this medicine, since it had reportedly not been called into her pharmacy -she does not have any established outpatient mental health provider and will need to [facility] to [facility] -Seroquel was restarted on 8/22 -She continues to show signs of mania and is refusing skilled nursing facility placement -It is unclear whether she truly has capacity to understand the risks of going home without adequate support -We will request psychiatry evaluation to help determine if patient has capacity to make decisions around her discharge disposition, also if there are any medication recommendations for bipolar     DVT prophylaxis: SCDs  Code Status: full code Family Communication: discussed with patient's son Disposition Plan: Status is: Inpatient   Remains inpatient appropriate because:IV treatments appropriate due to intensity of illness or inability to take PO and Inpatient level of care appropriate due to severity of illness   Dispo: The patient is from: Home              Anticipated d/c is to: SNF              Patient currently is not medically stable to d/c.              Difficult to place patient No      Consultants:  Orthopedics Psychiatry   Procedures:   See below   Antimicrobials:  Ceftriaxone 8/21> Flagyl 8/21>    Subjective: Patient  seen and evaluated today and is adamant about wanting to go home.  She has been evaluated by TTS and is noted to have decision-making capacity and is now agreeable to SNF.  Objective: Vitals:   05/23/21 1346 05/23/21 2035 05/24/21 0521 05/24/21 1453  BP: (!) 159/77 (!) 141/56 134/79 133/61  Pulse: 79 66 (!) 55 (!) 58  Resp: 20 17 20 18   Temp: 98 F (36.7 C) 98.7 F (37.1 C) 98.2 F (36.8 C) 97.7 F (36.5 C)  TempSrc: Oral   Oral   SpO2: 98% 97% 95% 98%  Weight:      Height:        Intake/Output Summary (Last 24 hours) at 05/24/2021 1653 Last data filed at 05/24/2021 1502 Gross per 24 hour  Intake 2440.03 ml  Output 2450 ml  Net -9.97 ml   Filed Weights   05/20/21 1739 05/21/21 1633  Weight: 72.6 kg 67.9 kg    Examination:  General exam: Appears calm and comfortable  Respiratory system: Clear to auscultation. Respiratory effort normal. Cardiovascular system: S1 & S2 heard, RRR.  Gastrointestinal system: Abdomen is soft Central nervous system: Alert and awake Extremities: No edema Skin: No significant lesions noted Psychiatry: Flat affect.    Data Reviewed: I have personally reviewed following labs and imaging studies  CBC: Recent Labs  Lab 05/20/21 2046 05/21/21 0449 05/22/21 0531 05/23/21 0603  WBC 4.8 13.7* 7.3 7.9  NEUTROABS 2.6 10.8*  --   --   HGB 14.7 11.3* 9.3* 10.6*  HCT 43.9 35.8* 29.0* 33.5*  MCV 92.0 96.5 96.3 96.8  PLT 216 368 318 344   Basic Metabolic Panel: Recent Labs  Lab 05/20/21 2046 05/21/21 0449 05/22/21 0531  NA 135 138 137  K 3.6 5.1 4.7  CL 99 99 101  CO2 29 32 30  GLUCOSE 168* 114* 146*  BUN 10 10 12   CREATININE 0.78 0.79 0.82  CALCIUM 8.5* 8.4* 8.3*  MG  --  2.0  --   PHOS  --  4.1  --    GFR: Estimated Creatinine Clearance: 65.7 mL/min (by C-G formula based on SCr of 0.82 mg/dL). Liver Function Tests: Recent Labs  Lab 05/20/21 2046 05/21/21 0449  AST 14* 14*  ALT 16 17  ALKPHOS 173* 182*  BILITOT 0.2* 0.6  PROT 6.7 7.2  ALBUMIN 3.1* 3.2*   No results for input(s): LIPASE, AMYLASE in the last 168 hours. No results for input(s): AMMONIA in the last 168 hours. Coagulation Profile: No results for input(s): INR, PROTIME in the last 168 hours. Cardiac Enzymes: No results for input(s): CKTOTAL, CKMB, CKMBINDEX, TROPONINI in the last 168 hours. BNP (last 3 results) No results for input(s): PROBNP in the last 8760 hours. HbA1C: No results for  input(s): HGBA1C in the last 72 hours. CBG: Recent Labs  Lab 05/23/21 1146 05/23/21 1609 05/23/21 2125 05/24/21 0725 05/24/21 1613  GLUCAP 240* 172* 189* 135* 248*   Lipid Profile: No results for input(s): CHOL, HDL, LDLCALC, TRIG, CHOLHDL, LDLDIRECT in the last 72 hours. Thyroid Function Tests: No results for input(s): TSH, T4TOTAL, FREET4, T3FREE, THYROIDAB in the last 72 hours. Anemia Panel: No results for input(s): VITAMINB12, FOLATE, FERRITIN, TIBC, IRON, RETICCTPCT in the last 72 hours. Sepsis Labs: No results for input(s): PROCALCITON, LATICACIDVEN in the last 168 hours.  Recent Results (from the past 240 hour(s))  Resp Panel by RT-PCR (Flu A&B, Covid) Nasopharyngeal Swab     Status: None   Collection Time: 05/20/21  8:46 PM  Specimen: Nasopharyngeal Swab; Nasopharyngeal(NP) swabs in vial transport medium  Result Value Ref Range Status   SARS Coronavirus 2 by RT PCR NEGATIVE NEGATIVE Final    Comment: (NOTE) SARS-CoV-2 target nucleic acids are NOT DETECTED.  The SARS-CoV-2 RNA is generally detectable in upper respiratory specimens during the acute phase of infection. The lowest concentration of SARS-CoV-2 viral copies this assay can detect is 138 copies/mL. A negative result does not preclude SARS-Cov-2 infection and should not be used as the sole basis for treatment or other patient management decisions. A negative result may occur with  improper specimen collection/handling, submission of specimen other than nasopharyngeal swab, presence of viral mutation(s) within the areas targeted by this assay, and inadequate number of viral copies(<138 copies/mL). A negative result must be combined with clinical observations, patient history, and epidemiological information. The expected result is Negative.  Fact Sheet for Patients:  BloggerCourse.com  Fact Sheet for Healthcare Providers:  SeriousBroker.it  This test is no  t yet approved or cleared by the [place] FDA and  has been authorized for detection and/or diagnosis of SARS-CoV-2 by FDA under an Emergency Use Authorization (EUA). This EUA will remain  in effect (meaning this test can be used) for the duration of the COVID-19 declaration under Section 564(b)(1) of the Act, 21 U.S.C.section 360bbb-3(b)(1), unless the authorization is terminated  or revoked sooner.       Influenza A by PCR NEGATIVE NEGATIVE Final   Influenza B by PCR NEGATIVE NEGATIVE Final    Comment: (NOTE) The Xpert Xpress SARS-CoV-2/FLU/RSV plus assay is intended as an aid in the diagnosis of influenza from Nasopharyngeal swab specimens and should not be used as a sole basis for treatment. Nasal washings and aspirates are unacceptable for Xpert Xpress SARS-CoV-2/FLU/RSV testing.  Fact Sheet for Patients: BloggerCourse.com  Fact Sheet for Healthcare Providers: SeriousBroker.it  This test is not yet approved or cleared by the [place] FDA and has been authorized for detection and/or diagnosis of SARS-CoV-2 by FDA under an Emergency Use Authorization (EUA). This EUA will remain in effect (meaning this test can be used) for the duration of the COVID-19 declaration under Section 564(b)(1) of the Act, 21 U.S.C. section 360bbb-3(b)(1), unless the authorization is terminated or revoked.  Performed at [facility], [address], [place], [place] [address]   Culture, blood (Routine X 2) w Reflex to ID Panel     Status: None (Preliminary result)   Collection Time: 05/20/21  8:46 PM   Specimen: BLOOD RIGHT FOREARM  Result Value Ref Range Status   Specimen Description BLOOD RIGHT FOREARM  Final   Special Requests   Final    BOTTLES DRAWN AEROBIC AND ANAEROBIC Blood Culture adequate volume   Culture   Final    NO GROWTH 4 DAYS Performed at [facility], [address], [place], [place] [address]    Report Status PENDING   Incomplete  Culture, blood (Routine X 2) w Reflex to ID Panel     Status: None (Preliminary result)   Collection Time: 05/20/21  9:23 PM   Specimen: BLOOD LEFT ARM  Result Value Ref Range Status   Specimen Description BLOOD LEFT ARM  Final   Special Requests   Final    BOTTLES DRAWN AEROBIC AND ANAEROBIC Blood Culture adequate volume   Culture   Final    NO GROWTH 4 DAYS Performed at [facility], [address] [place], [place] [address]    Report Status PENDING  Incomplete  Radiology Studies: No results found.      Scheduled Meds:  (feeding supplement) PROSource Plus  30 mL Oral BID BM   atorvastatin  40 mg Oral Daily   gabapentin  100 mg Oral TID   lisinopril  10 mg Oral Daily   And   hydrochlorothiazide  12.5 mg Oral Daily   insulin aspart  0-15 Units Subcutaneous TID WC   insulin aspart  0-5 Units Subcutaneous QHS   insulin glargine-yfgn  10 Units Subcutaneous QHS   nicotine  21 mg Transdermal Daily   oxybutynin  10 mg Oral QHS   QUEtiapine  200 mg Oral QHS   Continuous Infusions:  cefTRIAXone (ROCEPHIN)  IV 2 g (05/24/21 0601)   And   metronidazole Stopped (05/24/21 0750)     LOS: 4 days    Time spent: 35 minutes    Kimie Pidcock Hoover Brunette, DO [facility]  If 7PM-7AM, please contact night-coverage [email or web address] 05/24/2021, 4:53 PM

## 2021-05-24 NOTE — Progress Notes (Signed)
Patient ID: [name], female   DOB: 14-Sep-1961, 60 y.o.   MRN: [id number]  Follow-up regarding [name]  Patient was placed in a Jones dressing secondary to refusal to wear brace on left foot  Podiatry in [place] at [facility] has agreed to see the patient in follow-up

## 2021-05-24 NOTE — TOC Progression Note (Signed)
Transition of Care [facility]) - Progression Note    Patient Details  Name: [name] MRN: [id number] Date of Birth: March 07, 1961  [facility]) CM/SW Contact  Barry Brunner, LCSW Phone Number: 05/24/2021, 1:48 PM  Clinical Narrative:    To Whom it may Concern:   Please be advised that the above name patient will require a short-term nursing stay - anticipated 30 days or less rehabilitation and strengthening. The plan is for return home.    Expected Discharge Plan: Skilled Nursing Facility Barriers to Discharge: Continued Medical Work up  Expected Discharge Plan and Services Expected Discharge Plan: Skilled Nursing Facility                                               Social Determinants of Health (SDOH) Interventions    Readmission Risk Interventions Readmission Risk Prevention Plan 05/23/2021  Transportation Screening Complete  HRI or Home Care Consult Complete  Social Work Consult for Recovery Care Planning/Counseling Complete  Palliative Care Screening Not Applicable  Medication Review Oceanographer) Complete  Some recent data might be hidden

## 2021-05-24 NOTE — TOC Progression Note (Addendum)
Transition of Care [facility]) - Progression Note    Patient Details  Name: [name] MRN: [id number] Date of Birth: 25-Mar-1961  Transition of Care [facility]) CM/SW Contact  Barry Brunner, LCSW Phone Number: 05/24/2021, 3:08 PM  Clinical Narrative:    Patient now agreeable to SNF per psych consult note. CSW uploaded requested PASSR documents. Carla with [facility] Placed Bed offer. Albin Felling agreeable to start auth. Patient's son requested [facility] POA assistance. Chaplain will not be onsite until Friday. [employer] Consult entered by RN. Patient's son notified. TOC to follow.   Expected Discharge Plan: Skilled Nursing Facility Barriers to Discharge: Continued Medical Work up  Expected Discharge Plan and Services Expected Discharge Plan: Skilled Nursing Facility                                               Social Determinants of Health (SDOH) Interventions    Readmission Risk Interventions Readmission Risk Prevention Plan 05/23/2021  Transportation Screening Complete  HRI or Home Care Consult Complete  Social Work Consult for Recovery Care Planning/Counseling Complete  Palliative Care Screening Not Applicable  Medication Review Oceanographer) Complete  Some recent data might be hidden

## 2021-05-24 NOTE — Consult Note (Signed)
[facility] Face-to-Face Psychiatry Consult   Reason for Consult: Capacity and evaluation  Referring Physician:  Dr. Sherryll Burger Patient Identification: [name] MRN:  [id number] Principal Diagnosis: <principal problem not specified> Diagnosis:  Active Problems:   HTN (hypertension)   Tobacco abuse   Hyperlipidemia   DM2 (diabetes mellitus, type 2) (HCC)   Cellulitis   Total Time spent with patient: 30 minutes  Subjective:   [name] is a 60 y.o. female patient admitted with left foot swelling diagnosed with cellulitis.  Patient has a history of bipolar disorder, currently being managed with Seroquel 200 mg p.o. nightly.  Patient is seen and assessed by this nurse practitioner.  She is observed to be sitting upright in a bedside chair, finishing her lunch.  She does appear to be alert and oriented, calm and cooperative, and very pleasant.  She does present as tangential, however does not appear to be manic as her speech does appear to be controlled, she does not appear to be delusional or grandiose, or exhibiting any mood lability.  When assessing for capacity she is able to appreciate, understand, and rationalize her reasons for going home versus going to a skilled nursing facility.  At the end of the evaluation patient is in agreement to continue with current recommended treatment plan for a skilled nursing facility for rehabilitation.  She currently denies any depressive symptoms, hopelessness, worthlessness.  She physically has lots of energy, and motivation to get well.  She appears to be forward thinking, and agrees with plan stated.  HPI:  [name]  is a 60 y.o. female, with history of DMII, HLD, HTN, Tobacco abuse and more presents to the ED with a chief complaint of left foot swelling. Patient reports that she is not sure when the swelling started but she thinks it was 2 days ago.  She reports that it started as minimal swelling and has progressed since then.  She reports that  it is also been erythematous for 2 days.  She reports a severe hurt, that is worse with weightbearing.  She denies trauma.  She has had episodes of nausea and vomiting 3 or 4 times a day.  She reports that that is because she eats [place] food and she was throws up when she eats [place] food.  She is been monitoring her sugars at home and they run 98-200.  She is not sure what medications or how much she takes, but does know she uses pills and shots for her diabetes.  Patient is a current smoker and she smokes a pack a day.  She drinks alcohol, 1 beer once in a while per her report.  She does not use illicit drugs.  She is vaccinated for COVID.  Patient is full code.  Past Psychiatric History: Bipolar, currently being managed by Seroquel 200mg  po qhs.   Risk to Self:   Denies Risk to Others:   Denies Prior Inpatient Therapy:   Last admission 03/2021 at [facility].  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;  Surgeon: [name], DPM;  Location: [facility] OR;  Service: Podiatry;  Laterality: Left;   Family History:  Family History  Problem Relation Age of Onset   Asthma Mother    Cancer Mother    Hypertension Mother    Diabetes Mother    Hypertension Father    Diabetes Father    Other Brother        hit by car   Breast cancer Sister    Stroke Brother    Hypertension Maternal Grandmother    Diabetes Maternal Grandmother    Hyperlipidemia Maternal Grandmother     Hypertension Maternal Grandfather    Hyperlipidemia Maternal Grandfather    Hypertension Paternal Grandmother    Hyperlipidemia Paternal Grandmother    Hypertension Paternal Grandfather    Hyperlipidemia Paternal Actor    Family Psychiatric  History: Denies Social History:  Social History   Substance and Sexual Activity  Alcohol Use No     Social History   Substance and Sexual Activity  Drug Use No    Social History   Socioeconomic History   Marital status: Divorced    Spouse name: Not on file   Number of children: Not on file   Years of education: Not on file   Highest education level: Not on file  Occupational History   Not on file  Tobacco Use   Smoking status: Every Day    Packs/day: 0.50    Years: 34.00    Pack years: 17.00    Types: Cigarettes   Smokeless tobacco: Never  Vaping Use   Vaping Use: Never used  Substance and Sexual Activity   Alcohol use: No   Drug use: No   Sexual activity: Not Currently    Birth control/protection: Post-menopausal, Abstinence  Other Topics Concern   Not on file  Social History Narrative   Not on file   Social Determinants of Health   Financial Resource Strain: Low Risk    Difficulty of Paying Living Expenses: Not hard at all  Food Insecurity: No Food Insecurity   Worried About Programme researcher, broadcasting/film/video in the Last Year: Never true   Ran Out of Food in the Last Year: Never true  Transportation Needs: No Transportation Needs   Lack of Transportation (Medical): No   Lack of Transportation (Non-Medical): No  Physical Activity: Insufficiently Active   Days of Exercise per Week: 7 days   Minutes of Exercise per Session: 20 min  Stress: Stress Concern Present   Feeling of Stress : To some extent  Social Connections: Moderately Isolated   Frequency of Communication with Friends and Family: More than three times a week   Frequency of Social Gatherings with Friends and Family: Twice a week   Attends Religious Services: More  than 4 times per year   Active Member of [employer] or Organizations: No   Attends Banker Meetings: Never   Marital Status: Divorced   Additional Social History: Pt grew up in [place], [place], she went to 7th grade, quit got pregnant. She was married twice, she has one boy. She lives in [place] apt, She is on Disability. Pt worked in tobacco fields    Allergies:  No Known Allergies  Labs:  Results for orders placed or performed during the hospital encounter of 05/20/21 (from the past 48 hour(s))  Glucose, capillary     Status: Abnormal   Collection Time: 05/22/21  4:17 PM  Result Value Ref  Range   Glucose-Capillary 203 (H) 70 - 99 mg/dL    Comment: Glucose reference range applies only to samples taken after fasting for at least 8 hours.  Glucose, capillary     Status: Abnormal   Collection Time: 05/22/21  9:36 PM  Result Value Ref Range   Glucose-Capillary 263 (H) 70 - 99 mg/dL    Comment: Glucose reference range applies only to samples taken after fasting for at least 8 hours.  CBC     Status: Abnormal   Collection Time: 05/23/21  6:03 AM  Result Value Ref Range   WBC 7.9 4.0 - 10.5 K/uL   RBC 3.46 (L) 3.87 - 5.11 MIL/uL   Hemoglobin 10.6 (L) 12.0 - 15.0 g/dL   HCT 16.1 (L) 09.6 - 04.5 %   MCV 96.8 80.0 - 100.0 fL   MCH 30.6 26.0 - 34.0 pg   MCHC 31.6 30.0 - 36.0 g/dL   RDW 40.9 81.1 - 91.4 %   Platelets 344 150 - 400 K/uL   nRBC 0.0 0.0 - 0.2 %    Comment: Performed at [facility], [address] [place], [place] [address]  Glucose, capillary     Status: Abnormal   Collection Time: 05/23/21  7:47 AM  Result Value Ref Range   Glucose-Capillary 101 (H) 70 - 99 mg/dL    Comment: Glucose reference range applies only to samples taken after fasting for at least 8 hours.  Glucose, capillary     Status: Abnormal   Collection Time: 05/23/21 11:46 AM  Result Value Ref Range   Glucose-Capillary 240 (H) 70 - 99 mg/dL    Comment: Glucose reference range applies only to  samples taken after fasting for at least 8 hours.  Glucose, capillary     Status: Abnormal   Collection Time: 05/23/21  4:09 PM  Result Value Ref Range   Glucose-Capillary 172 (H) 70 - 99 mg/dL    Comment: Glucose reference range applies only to samples taken after fasting for at least 8 hours.  Glucose, capillary     Status: Abnormal   Collection Time: 05/23/21  9:25 PM  Result Value Ref Range   Glucose-Capillary 189 (H) 70 - 99 mg/dL    Comment: Glucose reference range applies only to samples taken after fasting for at least 8 hours.  Glucose, capillary     Status: Abnormal   Collection Time: 05/24/21  7:25 AM  Result Value Ref Range   Glucose-Capillary 135 (H) 70 - 99 mg/dL    Comment: Glucose reference range applies only to samples taken after fasting for at least 8 hours.    Current Facility-Administered Medications  Medication Dose Route Frequency Provider Last Rate Last Admin   (feeding supplement) PROSource Plus liquid 30 mL  30 mL Oral BID BM Erick Blinks, MD   30 mL at 05/24/21 1220   acetaminophen (TYLENOL) tablet 650 mg  650 mg Oral Q6H PRN Zierle-Ghosh, Asia B, DO   650 mg at 05/23/21 1628   Or   acetaminophen (TYLENOL) suppository 650 mg  650 mg Rectal Q6H PRN Zierle-Ghosh, Asia B, DO       albuterol (PROVENTIL) (2.5 MG/3ML) 0.083% nebulizer solution 2.5 mg  2.5 mg Nebulization Q4H PRN Zierle-Ghosh, Asia B, DO       atorvastatin (LIPITOR) tablet 40 mg  40 mg Oral Daily Zierle-Ghosh, Asia B, DO   40 mg at 05/24/21 0800   cefTRIAXone (ROCEPHIN) 2 g in sodium chloride 0.9 % 100 mL IVPB  2 g Intravenous Q24H Zierle-Ghosh, Asia B, DO 200 mL/hr at 05/24/21 0601 2 g at 05/24/21 6629   And   metroNIDAZOLE (FLAGYL) IVPB 500 mg  500 mg Intravenous Q12H Zierle-Ghosh, Asia B, DO 100 mL/hr at 05/24/21 0650 500 mg at 05/24/21 0650   gabapentin (NEURONTIN) capsule 100 mg  100 mg Oral TID Zierle-Ghosh, Asia B, DO   100 mg at 05/24/21 0801   haloperidol lactate (HALDOL) injection 5 mg   5 mg Intravenous Q6H PRN Erick Blinks, MD   5 mg at 05/24/21 1232   lisinopril (ZESTRIL) tablet 10 mg  10 mg Oral Daily Erick Blinks, MD   10 mg at 05/24/21 0800   And   hydrochlorothiazide (MICROZIDE) capsule 12.5 mg  12.5 mg Oral Daily Erick Blinks, MD   12.5 mg at 05/24/21 0800   insulin aspart (novoLOG) injection 0-15 Units  0-15 Units Subcutaneous TID WC Zierle-Ghosh, Asia B, DO   2 Units at 05/24/21 1220   insulin aspart (novoLOG) injection 0-5 Units  0-5 Units Subcutaneous QHS Zierle-Ghosh, Asia B, DO   3 Units at 05/22/21 2139   insulin glargine-yfgn (SEMGLEE) injection 10 Units  10 Units Subcutaneous QHS Zierle-Ghosh, Asia B, DO   10 Units at 05/23/21 2133   morphine 2 MG/ML injection 2 mg  2 mg Intravenous Q2H PRN Zierle-Ghosh, Asia B, DO       nicotine (NICODERM CQ - dosed in mg/24 hours) patch 21 mg  21 mg Transdermal Daily Zierle-Ghosh, Asia B, DO   21 mg at 05/23/21 2132   ondansetron (ZOFRAN) tablet 4 mg  4 mg Oral Q6H PRN Zierle-Ghosh, Asia B, DO       Or   ondansetron (ZOFRAN) injection 4 mg  4 mg Intravenous Q6H PRN Zierle-Ghosh, Asia B, DO       oxybutynin (DITROPAN-XL) 24 hr tablet 10 mg  10 mg Oral QHS Zierle-Ghosh, Asia B, DO   10 mg at 05/23/21 2133   oxyCODONE (Oxy IR/ROXICODONE) immediate release tablet 5 mg  5 mg Oral Q4H PRN Zierle-Ghosh, Asia B, DO   5 mg at 05/22/21 0935   oxyCODONE-acetaminophen (PERCOCET/ROXICET) 5-325 MG per tablet 1-2 tablet  1-2 tablet Oral Q4H PRN Zierle-Ghosh, Asia B, DO   2 tablet at 05/23/21 2133   QUEtiapine (SEROQUEL) tablet 200 mg  200 mg Oral QHS Erick Blinks, MD   200 mg at 05/23/21 2133    Musculoskeletal: Strength & Muscle Tone: within normal limits Gait & Station: normal Patient leans: N/A   Psychiatric Specialty Exam:  Presentation  General Appearance: Appropriate for Environment; Casual  Eye Contact:Fair  Speech:Clear and Coherent; Normal Rate  Speech Volume:Normal  Handedness:Right   Mood and Affect   Mood:Anxious  Affect:Appropriate; Congruent   Thought Process  Thought Processes:Coherent; Linear  Descriptions of Associations:Tangential  Orientation:Full (Time, Place and Person)  Thought Content:Logical  History of Schizophrenia/Schizoaffective disorder:No  Duration of Psychotic Symptoms:No data recorded Hallucinations:Hallucinations: None  Ideas of Reference:None  Suicidal Thoughts:Suicidal Thoughts: No  Homicidal Thoughts:Homicidal Thoughts: No   Sensorium  Memory:Immediate Good; Recent Good; Remote Good  Judgment:Fair  Insight:Fair   Executive Functions  Concentration:Good  Attention Span:Good  Recall:Good  Fund of Knowledge:Good  Language:Good   Psychomotor Activity  Psychomotor Activity:Psychomotor Activity: Normal   Assets  Assets:Communication Skills; Desire for Improvement; Leisure Time; Physical Health; Social Support; Resilience; Financial Resources/Insurance; Housing   Sleep  Sleep:Sleep: Fair   Physical Exam: Physical Exam ROS Blood pressure 134/79, pulse (!) 55, temperature 98.2 F (36.8  C), resp. rate 20, height 5\' 5"  (1.651 m), weight 67.9 kg, SpO2 95 %. Body mass index is 24.91 kg/m.  Treatment Plan Summary: Medication management and Plan will continue quetiapine 200 mg p.o. nightly.  Patient may benefit from adding mood stabilizer if she continues to present with concerns of mania.  At this time she does not appear to be acting erratic, displaying any disruptive behaviors, or delusional.  -Continue Seroquel 200 mg p.o. nightly.  Will recommend obtaining EKG prior to titration upward of this medication.  Although patient appears to be stable at this time, there appears to be some concerns regarding increase in mania.  However chart review shows there has been no additional nursing documentation since May 21, 2021, documentation currently does not support increasing mania behaviors.  -At this time it is determined that  patient does have capacity to make medical decisions as it pertains to her refusal of SNF placement.  At the end of the evaluation patient does voice willingness to participate in medical treatment in a skilled nursing facility.  Disposition: No evidence of imminent risk to self or others at present.   Patient does not meet criteria for psychiatric inpatient admission. Supportive therapy provided about ongoing stressors. Refer to IOP.  Maryagnes Amosakia S Starkes-Perry, FNP 05/24/2021 12:41 PM

## 2021-05-25 DIAGNOSIS — L03116 Cellulitis of left lower limb: Secondary | ICD-10-CM | POA: Diagnosis not present

## 2021-05-25 LAB — CULTURE, BLOOD (ROUTINE X 2)
Culture: NO GROWTH
Culture: NO GROWTH
Special Requests: ADEQUATE
Special Requests: ADEQUATE

## 2021-05-25 LAB — GLUCOSE, CAPILLARY
Glucose-Capillary: 147 mg/dL — ABNORMAL HIGH (ref 70–99)
Glucose-Capillary: 185 mg/dL — ABNORMAL HIGH (ref 70–99)
Glucose-Capillary: 191 mg/dL — ABNORMAL HIGH (ref 70–99)
Glucose-Capillary: 211 mg/dL — ABNORMAL HIGH (ref 70–99)

## 2021-05-25 NOTE — Progress Notes (Signed)
[facility] has been on hold over 2 hours last two days trying to start INS AUTH.  TOC attempted to look patient up in [facility]. TOC unable to start INS AUTH for medicaid.  Albin Felling will call again today to working on getting insurance auth.

## 2021-05-25 NOTE — Progress Notes (Signed)
PROGRESS NOTE    [name]  QQV:[id number] DOB: December 25, 1960 DOA: 05/20/2021 PCP: Avon Gully, MD   Brief Narrative:   60 y/o female with history of HTN, DM, presents to ED with left foot swelling. She denies any injury to this area. She was noted to have cellulitis of LLE and started on antibiotics. Plain films of left foot indicated divergent lisfranc dislocation. Orthopedics consulted.  Management will be nonoperative at this time.  Seen by PT with recommendations for skilled nursing facility placement.  She has a history of bipolar and has been restarted on Seroquel.  She does have signs of mania and is refusing skilled nursing facility placement.  Psychiatry consulted to help determine capacity as well as medication adjustments for bipolar and they have stated that patient now has capacity and is agreeable to going to SNF on discharge.  Assessment & Plan:   Active Problems:   HTN (hypertension)   Tobacco abuse   Hyperlipidemia   DM2 (diabetes mellitus, type 2) (HCC)   Cellulitis   LLE cellulitis -currently on IV antibiotics until discharge (day 5/10) -overall erythema improving -venous doppler negative for DVT -left ABI 0.94 -keep LE elevated   Left foot divergent lisfranc dislocation -will keep patient NWB on LLE -appreciate ortho input -patient lives alone and will need PT evaluation for safety of discharge home -Seen by physical therapy with recommendations for skilled nursing facility placement -After discharge, patient will follow up with her podiatrist Dr. Allena Katz -Patient will follow-up with [facility] in outpatient setting per Dr. Romeo Apple   DM2 -continue basal insulin -continue sliding scale -blood sugars stable   HTN -currently on lisinopril and HCTZ -BP stable   HLD -continue on statin   Bipolar disorder -patient noted to have bizarre behavior this morning -she did acknowledge that she often talks to herself and her father that had  passed away years ago -discussed with son and he confirms this behavior and feels as though her behavior is currently better than it has been in the past -review of records indicate that she was hospitalized for manic episode at [facility] in 03/2021 and was discharged on seroquel 200mg  qhs -son reports that he was unable to get this medicine, since it had reportedly not been called into her pharmacy -she does not have any established outpatient mental health provider and will need to referred to [facility] -Seroquel was restarted on 8/22 -She continues to show signs of mania and is refusing skilled nursing facility placement -It is unclear whether she truly has capacity to understand the risks of going home without adequate support -We will request psychiatry evaluation to help determine if patient has capacity to make decisions around her discharge disposition, also if there are any medication recommendations for bipolar     DVT prophylaxis: SCDs  Code Status: full code Family Communication: discussed with patient's son Disposition Plan: Status is: Inpatient   Remains inpatient appropriate because:IV treatments appropriate due to intensity of illness or inability to take PO and Inpatient level of care appropriate due to severity of illness   Dispo: The patient is from: Home              Anticipated d/c is to: SNF              Patient currently is not medically stable to d/c.              Difficult to place patient No      Consultants:  Orthopedics Psychiatry  Procedures:   See below   Antimicrobials:  Ceftriaxone 8/21> Flagyl 8/21>   Subjective: Patient seen and evaluated today with no new acute complaints or concerns. No acute concerns or events noted overnight.  Objective: Vitals:   05/25/21 0531 05/25/21 1037 05/25/21 1038 05/25/21 1348  BP: (!) 90/56 (!) 151/73 (!) 183/65 140/72  Pulse: 69   80  Resp: 17   16  Temp: 98 F (36.7 C)   98 F (36.7 C)  TempSrc:      SpO2:  93%   99%  Weight:      Height:        Intake/Output Summary (Last 24 hours) at 05/25/2021 1429 Last data filed at 05/25/2021 0900 Gross per 24 hour  Intake 1783.21 ml  Output 500 ml  Net 1283.21 ml   Filed Weights   05/20/21 1739 05/21/21 1633  Weight: 72.6 kg 67.9 kg    Examination:  General exam: Appears calm and comfortable  Respiratory system: Clear to auscultation. Respiratory effort normal. Cardiovascular system: S1 & S2 heard, RRR.  Gastrointestinal system: Abdomen is soft Central nervous system: Alert and awake Extremities: Left foot in dressings clean dry and intact Skin: No significant lesions noted Psychiatry: Flat affect.    Data Reviewed: I have personally reviewed following labs and imaging studies  CBC: Recent Labs  Lab 05/20/21 2046 05/21/21 0449 05/22/21 0531 05/23/21 0603  WBC 4.8 13.7* 7.3 7.9  NEUTROABS 2.6 10.8*  --   --   HGB 14.7 11.3* 9.3* 10.6*  HCT 43.9 35.8* 29.0* 33.5*  MCV 92.0 96.5 96.3 96.8  PLT 216 368 318 344   Basic Metabolic Panel: Recent Labs  Lab 05/20/21 2046 05/21/21 0449 05/22/21 0531  NA 135 138 137  K 3.6 5.1 4.7  CL 99 99 101  CO2 29 32 30  GLUCOSE 168* 114* 146*  BUN 10 10 12   CREATININE 0.78 0.79 0.82  CALCIUM 8.5* 8.4* 8.3*  MG  --  2.0  --   PHOS  --  4.1  --    GFR: Estimated Creatinine Clearance: 65.7 mL/min (by C-G formula based on SCr of 0.82 mg/dL). Liver Function Tests: Recent Labs  Lab 05/20/21 2046 05/21/21 0449  AST 14* 14*  ALT 16 17  ALKPHOS 173* 182*  BILITOT 0.2* 0.6  PROT 6.7 7.2  ALBUMIN 3.1* 3.2*   No results for input(s): LIPASE, AMYLASE in the last 168 hours. No results for input(s): AMMONIA in the last 168 hours. Coagulation Profile: No results for input(s): INR, PROTIME in the last 168 hours. Cardiac Enzymes: No results for input(s): CKTOTAL, CKMB, CKMBINDEX, TROPONINI in the last 168 hours. BNP (last 3 results) No results for input(s): PROBNP in the last 8760  hours. HbA1C: No results for input(s): HGBA1C in the last 72 hours. CBG: Recent Labs  Lab 05/24/21 0725 05/24/21 1613 05/24/21 2104 05/25/21 0734 05/25/21 1128  GLUCAP 135* 248* 285* 147* 185*   Lipid Profile: No results for input(s): CHOL, HDL, LDLCALC, TRIG, CHOLHDL, LDLDIRECT in the last 72 hours. Thyroid Function Tests: No results for input(s): TSH, T4TOTAL, FREET4, T3FREE, THYROIDAB in the last 72 hours. Anemia Panel: No results for input(s): VITAMINB12, FOLATE, FERRITIN, TIBC, IRON, RETICCTPCT in the last 72 hours. Sepsis Labs: No results for input(s): PROCALCITON, LATICACIDVEN in the last 168 hours.  Recent Results (from the past 240 hour(s))  Resp Panel by RT-PCR (Flu A&B, Covid) Nasopharyngeal Swab     Status: None   Collection Time: 05/20/21  8:46  PM   Specimen: Nasopharyngeal Swab; Nasopharyngeal(NP) swabs in vial transport medium  Result Value Ref Range Status   SARS Coronavirus 2 by RT PCR NEGATIVE NEGATIVE Final    Comment: (NOTE) SARS-CoV-2 target nucleic acids are NOT DETECTED.  The SARS-CoV-2 RNA is generally detectable in upper respiratory specimens during the acute phase of infection. The lowest concentration of SARS-CoV-2 viral copies this assay can detect is 138 copies/mL. A negative result does not preclude SARS-Cov-2 infection and should not be used as the sole basis for treatment or other patient management decisions. A negative result may occur with  improper specimen collection/handling, submission of specimen other than nasopharyngeal swab, presence of viral mutation(s) within the areas targeted by this assay, and inadequate number of viral copies(<138 copies/mL). A negative result must be combined with clinical observations, patient history, and epidemiological information. The expected result is Negative.  Fact Sheet for Patients:  BloggerCourse.com  Fact Sheet for Healthcare Providers:   SeriousBroker.it  This test is no t yet approved or cleared by the [place] FDA and  has been authorized for detection and/or diagnosis of SARS-CoV-2 by FDA under an Emergency Use Authorization (EUA). This EUA will remain  in effect (meaning this test can be used) for the duration of the COVID-19 declaration under Section 564(b)(1) of the Act, 21 U.S.C.section 360bbb-3(b)(1), unless the authorization is terminated  or revoked sooner.       Influenza A by PCR NEGATIVE NEGATIVE Final   Influenza B by PCR NEGATIVE NEGATIVE Final    Comment: (NOTE) The Xpert Xpress SARS-CoV-2/FLU/RSV plus assay is intended as an aid in the diagnosis of influenza from Nasopharyngeal swab specimens and should not be used as a sole basis for treatment. Nasal washings and aspirates are unacceptable for Xpert Xpress SARS-CoV-2/FLU/RSV testing.  Fact Sheet for Patients: BloggerCourse.com  Fact Sheet for Healthcare Providers: SeriousBroker.it  This test is not yet approved or cleared by the [place] FDA and has been authorized for detection and/or diagnosis of SARS-CoV-2 by FDA under an Emergency Use Authorization (EUA). This EUA will remain in effect (meaning this test can be used) for the duration of the COVID-19 declaration under Section 564(b)(1) of the Act, 21 U.S.C. section 360bbb-3(b)(1), unless the authorization is terminated or revoked.  Performed at [facility], [address], [place], [place] [address]   Culture, blood (Routine X 2) w Reflex to ID Panel     Status: None   Collection Time: 05/20/21  8:46 PM   Specimen: BLOOD RIGHT FOREARM  Result Value Ref Range Status   Specimen Description BLOOD RIGHT FOREARM  Final   Special Requests   Final    BOTTLES DRAWN AEROBIC AND ANAEROBIC Blood Culture adequate volume   Culture   Final    NO GROWTH 5 DAYS Performed at [facility], [address]  [place], [place] [address]    Report Status 05/25/2021 FINAL  Final  Culture, blood (Routine X 2) w Reflex to ID Panel     Status: None   Collection Time: 05/20/21  9:23 PM   Specimen: BLOOD LEFT ARM  Result Value Ref Range Status   Specimen Description BLOOD LEFT ARM  Final   Special Requests   Final    BOTTLES DRAWN AEROBIC AND ANAEROBIC Blood Culture adequate volume   Culture   Final    NO GROWTH 5 DAYS Performed at [facility], [address], [place], [place] [address]    Report Status 05/25/2021 FINAL  Final  Radiology Studies: No results found.      Scheduled Meds:  (feeding supplement) PROSource Plus  30 mL Oral BID BM   atorvastatin  40 mg Oral Daily   gabapentin  100 mg Oral TID   lisinopril  10 mg Oral Daily   And   hydrochlorothiazide  12.5 mg Oral Daily   insulin aspart  0-15 Units Subcutaneous TID WC   insulin aspart  0-5 Units Subcutaneous QHS   insulin glargine-yfgn  10 Units Subcutaneous QHS   nicotine  21 mg Transdermal Daily   oxybutynin  10 mg Oral QHS   QUEtiapine  200 mg Oral QHS   Continuous Infusions:  cefTRIAXone (ROCEPHIN)  IV 2 g (05/25/21 0543)   And   metronidazole 500 mg (05/25/21 16100632)     LOS: 5 days    Time spent: 35 minutes    Demorris Choyce Hoover Brunette Reeda Soohoo, DO [facility]  If 7PM-7AM, please contact night-coverage [email or web address] 05/25/2021, 2:29 PM

## 2021-05-25 NOTE — Progress Notes (Signed)
Physical Therapy Treatment Patient Details Name: MARIAN Rivera MRN: [id number] DOB: 09-25-1961 Today's Date: 05/25/2021    History of Present Illness [name] is a 60 y.o. female presents to the ED with a chief complaint of left foot swelling. X-ray shows Lisfranc dislocation. Ortho consult note doesn't specify WB status. PMH: diabetes, HLD, HTN, Tobacco abuse,  s/p 2nd toe and partial ray resection in June    PT Comments    Pt tolerates STS 2 reps with RW, mod A to power to stand on RLE only and LLE suspended off of floor. Pt educated on seated LE strengthening exercises and able to perform with cues for motor control and increased ROM. Pt able to clear R foot with 1 hop forward step, ranging ~2 inches, then fatigues and requests to sit. Pt with difficulty verbalizing WB status but able to adhere well in standing. Continue to progress as able.   Follow Up Recommendations  SNF     Equipment Recommendations  None recommended by PT (defer to next venue)    Recommendations for Other Services       Precautions / Restrictions      Mobility  Bed Mobility  General bed mobility comments: in recliner    Transfers Overall transfer level: Needs assistance Equipment used: Rolling walker (2 wheeled) Transfers: Sit to/from Stand Sit to Stand: Mod assist    General transfer comment: mod A to power up from recliner, VCs for hand placement, powering up in RLE with LLE suspended off of floor, able to perform 2 reps  Ambulation/Gait Ambulation/Gait assistance: Mod assist  Assistive device: Rolling walker (2 wheeled)  General Gait Details: pt able to take 1 hop forward on RLE with LLE suspended off of floor for ~2 inches after 3 attempts to hop, then reports fatigue and able to sit back in recliner for rest   Stairs             Wheelchair Mobility    Modified Rankin (Stroke Patients Only)       Balance Overall balance assessment: Needs assistance  Standing balance  support: During functional activity;Bilateral upper extremity supported Standing balance-Leahy Scale: Poor Standing balance comment: mod A with RW, LLE NWB     Cognition Arousal/Alertness: Awake/alert Behavior During Therapy: WFL for tasks assessed/performed;Restless Overall Cognitive Status: No family/caregiver present to determine baseline cognitive functioning   General Comments: Pt tangential but somewhat easy to redirect to therapy. Pt pleasant, restless but able to follow commands. Continued education on NWB LLE and when asked pt to repeat back pt states "I don't know, let me try"- continuing to educate pt      Exercises General Exercises - Lower Extremity Ankle Circles/Pumps: Seated;AROM;Right;20 reps Short Arc Quad: Seated;AROM;Strengthening;Both;20 reps Long Arc Quad: Seated;AROM;Strengthening;Both;10 reps Hip Flexion/Marching: Seated;AROM;Strengthening;Both;10 reps    General Comments        Pertinent Vitals/Pain Pain Assessment: No/denies pain    Home Living                      Prior Function            PT Goals (current goals can now be found in the care plan section) Acute Rehab PT Goals Patient Stated Goal: "my son dropped me off here" PT Goal Formulation: With patient Time For Goal Achievement: 06/06/21 Potential to Achieve Goals: Good Progress towards PT goals: Progressing toward goals    Frequency    Min 2X/week      PT Plan Current  plan remains appropriate;Frequency needs to be updated    Co-evaluation              AM-PAC PT "6 Clicks" Mobility   Outcome Measure  Help needed turning from your back to your side while in a flat bed without using bedrails?: A Little Help needed moving from lying on your back to sitting on the side of a flat bed without using bedrails?: A Little Help needed moving to and from a bed to a chair (including a wheelchair)?: A Lot Help needed standing up from a chair using your arms (e.g., wheelchair  or bedside chair)?: A Lot Help needed to walk in hospital room?: A Lot Help needed climbing 3-5 steps with a railing? : Total 6 Click Score: 13    End of Session Equipment Utilized During Treatment: Gait belt Activity Tolerance: Patient tolerated treatment well Patient left: in chair;with call bell/phone within reach;with chair alarm set;with nursing/sitter in room Nurse Communication: Mobility status PT Visit Diagnosis: Unsteadiness on feet (R26.81);Other abnormalities of gait and mobility (R26.89);Muscle weakness (generalized) (M62.81);Difficulty in walking, not elsewhere classified (R26.2)     Time: 2671-2458 PT Time Calculation (min) (ACUTE ONLY): 18 min  Charges:  $Therapeutic Exercise: 8-22 mins                      Tori Kathalene Sporer PT, DPT 05/25/21, 1:57 PM

## 2021-05-26 DIAGNOSIS — L03116 Cellulitis of left lower limb: Secondary | ICD-10-CM | POA: Diagnosis not present

## 2021-05-26 LAB — GLUCOSE, CAPILLARY
Glucose-Capillary: 170 mg/dL — ABNORMAL HIGH (ref 70–99)
Glucose-Capillary: 303 mg/dL — ABNORMAL HIGH (ref 70–99)
Glucose-Capillary: 65 mg/dL — ABNORMAL LOW (ref 70–99)
Glucose-Capillary: 276 mg/dL — ABNORMAL HIGH (ref 70–99)
Glucose-Capillary: 331 mg/dL — ABNORMAL HIGH (ref 70–99)

## 2021-05-26 LAB — HEMOGLOBIN A1C
Hgb A1c MFr Bld: 7.2 % — ABNORMAL HIGH (ref 4.8–5.6)
Mean Plasma Glucose: 159.94 mg/dL

## 2021-05-26 MED ORDER — INSULIN ASPART 100 UNIT/ML IJ SOLN
0.0000 [IU] | Freq: Three times a day (TID) | INTRAMUSCULAR | Status: DC
  Administered 2021-05-27: 7 [IU] via SUBCUTANEOUS
  Administered 2021-05-27: 4 [IU] via SUBCUTANEOUS
  Administered 2021-05-28: 3 [IU] via SUBCUTANEOUS
  Administered 2021-05-28: 7 [IU] via SUBCUTANEOUS
  Administered 2021-05-29: 3 [IU] via SUBCUTANEOUS
  Administered 2021-05-29: 4 [IU] via SUBCUTANEOUS
  Administered 2021-05-29 – 2021-05-30 (×2): 3 [IU] via SUBCUTANEOUS
  Administered 2021-05-30: 4 [IU] via SUBCUTANEOUS

## 2021-05-26 MED ORDER — INSULIN ASPART 100 UNIT/ML IJ SOLN
0.0000 [IU] | Freq: Every day | INTRAMUSCULAR | Status: DC
  Administered 2021-05-26 – 2021-05-29 (×3): 4 [IU] via SUBCUTANEOUS

## 2021-05-26 MED ORDER — INSULIN GLARGINE-YFGN 100 UNIT/ML ~~LOC~~ SOLN
12.0000 [IU] | Freq: Every day | SUBCUTANEOUS | Status: DC
  Administered 2021-05-26: 12 [IU] via SUBCUTANEOUS
  Filled 2021-05-26 (×3): qty 0.12

## 2021-05-26 MED ORDER — GLUCOSE-VITAMIN C 4-6 GM-MG PO CHEW
CHEWABLE_TABLET | ORAL | Status: AC
  Administered 2021-05-26: 4 g
  Filled 2021-05-26: qty 1

## 2021-05-26 NOTE — Progress Notes (Signed)
PROGRESS NOTE    [name]  HCW:[id number] DOB: [birth year] DOA: 05/20/2021 PCP: Avon Gully, MD   Brief Narrative:   60 y/o female with history of HTN, DM, presents to ED with left foot swelling. She denies any injury to this area. She was noted to have cellulitis of LLE and started on antibiotics. Plain films of left foot indicated divergent lisfranc dislocation. Orthopedics consulted.  Management will be nonoperative at this time.  Seen by PT with recommendations for skilled nursing facility placement.  She has a history of bipolar and has been restarted on Seroquel.  She does have signs of mania and is refusing skilled nursing facility placement.  Psychiatry consulted to help determine capacity as well as medication adjustments for bipolar and they have stated that patient now has capacity and is agreeable to going to SNF on discharge. Currently awaiting insurance approval.  Assessment & Plan:   Active Problems:   HTN (hypertension)   Tobacco abuse   Hyperlipidemia   DM2 (diabetes mellitus, type 2) (HCC)   Cellulitis   LLE cellulitis -currently on IV antibiotics until discharge (day 6/10)-can transition to orals when ready for DC -overall erythema improving -venous doppler negative for DVT -left ABI 0.94 -keep LE elevated   Left foot divergent lisfranc dislocation -will keep patient NWB on LLE -appreciate ortho input -patient lives alone and will need PT evaluation for safety of discharge home -Seen by physical therapy with recommendations for skilled nursing facility placement -After discharge, patient will follow up with her podiatrist Dr. Allena Katz -Patient will follow-up with [facility] in outpatient setting per Dr. Virgel Bouquet hyperglycemia -increase basal insulin -increase sliding scale   HTN -currently on lisinopril and HCTZ -BP stable   HLD -continue on statin   Bipolar disorder -patient continues to have some bizarre behavior and  mania -review of records indicate that she was hospitalized for manic episode at [facility] in 03/2021 and was discharged on seroquel 200mg  qhs -son reports that he was unable to get this medicine, since it had reportedly not been called into her pharmacy -she does not have any established outpatient mental health provider and will need to referred to [facility] -Seroquel was restarted on 8/22 -Psychiatry evaluation determines that patient has capacity and is agreeable to SNF placement     DVT prophylaxis: SCDs  Code Status: full code Family Communication: None at bedside Disposition Plan: Status is: Inpatient   Remains inpatient appropriate because:IV treatments appropriate due to intensity of illness or inability to take PO and Inpatient level of care appropriate due to severity of illness   Dispo: The patient is from: Home              Anticipated d/c is to: SNF              Patient currently is not medically stable to d/c.              Difficult to place patient No      Consultants:  Orthopedics Psychiatry   Procedures:   See below   Antimicrobials:  Ceftriaxone 8/21> Flagyl 8/21>   Subjective: Patient seen and evaluated today with sitter at bedside-no new acute complaints or concerns. No acute concerns or events noted overnight.  Objective: Vitals:   05/25/21 1348 05/25/21 2042 05/26/21 0800 05/26/21 1331  BP: 140/72 (!) 147/75 135/77 (!) 135/56  Pulse: 80 80 77 81  Resp: 16 20    Temp: 98 F (36.7 C) 98 F (  36.7 C) 97.9 F (36.6 C) 97.9 F (36.6 C)  TempSrc:   Oral Oral  SpO2: 99% 99% 96% 100%  Weight:      Height:        Intake/Output Summary (Last 24 hours) at 05/26/2021 1545 Last data filed at 05/26/2021 1300 Gross per 24 hour  Intake 720 ml  Output 2150 ml  Net -1430 ml   Filed Weights   05/20/21 1739 05/21/21 1633  Weight: 72.6 kg 67.9 kg    Examination:  General exam: Appears calm and comfortable  Respiratory system: Clear to auscultation.  Respiratory effort normal. Cardiovascular system: S1 & S2 heard, RRR.  Gastrointestinal system: Abdomen is soft Central nervous system: Alert and awake Extremities: No edema Skin: No significant lesions noted Psychiatry: Flat affect.    Data Reviewed: I have personally reviewed following labs and imaging studies  CBC: Recent Labs  Lab 05/20/21 2046 05/21/21 0449 05/22/21 0531 05/23/21 0603  WBC 4.8 13.7* 7.3 7.9  NEUTROABS 2.6 10.8*  --   --   HGB 14.7 11.3* 9.3* 10.6*  HCT 43.9 35.8* 29.0* 33.5*  MCV 92.0 96.5 96.3 96.8  PLT 216 368 318 344   Basic Metabolic Panel: Recent Labs  Lab 05/20/21 2046 05/21/21 0449 05/22/21 0531  NA 135 138 137  K 3.6 5.1 4.7  CL 99 99 101  CO2 29 32 30  GLUCOSE 168* 114* 146*  BUN 10 10 12   CREATININE 0.78 0.79 0.82  CALCIUM 8.5* 8.4* 8.3*  MG  --  2.0  --   PHOS  --  4.1  --    GFR: Estimated Creatinine Clearance: 65.7 mL/min (by C-G formula based on SCr of 0.82 mg/dL). Liver Function Tests: Recent Labs  Lab 05/20/21 2046 05/21/21 0449  AST 14* 14*  ALT 16 17  ALKPHOS 173* 182*  BILITOT 0.2* 0.6  PROT 6.7 7.2  ALBUMIN 3.1* 3.2*   No results for input(s): LIPASE, AMYLASE in the last 168 hours. No results for input(s): AMMONIA in the last 168 hours. Coagulation Profile: No results for input(s): INR, PROTIME in the last 168 hours. Cardiac Enzymes: No results for input(s): CKTOTAL, CKMB, CKMBINDEX, TROPONINI in the last 168 hours. BNP (last 3 results) No results for input(s): PROBNP in the last 8760 hours. HbA1C: No results for input(s): HGBA1C in the last 72 hours. CBG: Recent Labs  Lab 05/25/21 1128 05/25/21 1604 05/25/21 2043 05/26/21 0714 05/26/21 1108  GLUCAP 185* 191* 211* 170* 303*   Lipid Profile: No results for input(s): CHOL, HDL, LDLCALC, TRIG, CHOLHDL, LDLDIRECT in the last 72 hours. Thyroid Function Tests: No results for input(s): TSH, T4TOTAL, FREET4, T3FREE, THYROIDAB in the last 72  hours. Anemia Panel: No results for input(s): VITAMINB12, FOLATE, FERRITIN, TIBC, IRON, RETICCTPCT in the last 72 hours. Sepsis Labs: No results for input(s): PROCALCITON, LATICACIDVEN in the last 168 hours.  Recent Results (from the past 240 hour(s))  Resp Panel by RT-PCR (Flu A&B, Covid) Nasopharyngeal Swab     Status: None   Collection Time: 05/20/21  8:46 PM   Specimen: Nasopharyngeal Swab; Nasopharyngeal(NP) swabs in vial transport medium  Result Value Ref Range Status   SARS Coronavirus 2 by RT PCR NEGATIVE NEGATIVE Final    Comment: (NOTE) SARS-CoV-2 target nucleic acids are NOT DETECTED.  The SARS-CoV-2 RNA is generally detectable in upper respiratory specimens during the acute phase of infection. The lowest concentration of SARS-CoV-2 viral copies this assay can detect is 138 copies/mL. A negative result does not  preclude SARS-Cov-2 infection and should not be used as the sole basis for treatment or other patient management decisions. A negative result may occur with  improper specimen collection/handling, submission of specimen other than nasopharyngeal swab, presence of viral mutation(s) within the areas targeted by this assay, and inadequate number of viral copies(<138 copies/mL). A negative result must be combined with clinical observations, patient history, and epidemiological information. The expected result is Negative.  Fact Sheet for Patients:  BloggerCourse.comhttps://www.fda.gov/media/152166/download  Fact Sheet for Healthcare Providers:  SeriousBroker.ithttps://www.fda.gov/media/152162/download  This test is no t yet approved or cleared by the [place] FDA and  has been authorized for detection and/or diagnosis of SARS-CoV-2 by FDA under an Emergency Use Authorization (EUA). This EUA will remain  in effect (meaning this test can be used) for the duration of the COVID-19 declaration under Section 564(b)(1) of the Act, 21 U.S.C.section 360bbb-3(b)(1), unless the authorization is  terminated  or revoked sooner.       Influenza A by PCR NEGATIVE NEGATIVE Final   Influenza B by PCR NEGATIVE NEGATIVE Final    Comment: (NOTE) The Xpert Xpress SARS-CoV-2/FLU/RSV plus assay is intended as an aid in the diagnosis of influenza from Nasopharyngeal swab specimens and should not be used as a sole basis for treatment. Nasal washings and aspirates are unacceptable for Xpert Xpress SARS-CoV-2/FLU/RSV testing.  Fact Sheet for Patients: BloggerCourse.comhttps://www.fda.gov/media/152166/download  Fact Sheet for Healthcare Providers: SeriousBroker.ithttps://www.fda.gov/media/152162/download  This test is not yet approved or cleared by the [place] FDA and has been authorized for detection and/or diagnosis of SARS-CoV-2 by FDA under an Emergency Use Authorization (EUA). This EUA will remain in effect (meaning this test can be used) for the duration of the COVID-19 declaration under Section 564(b)(1) of the Act, 21 U.S.C. section 360bbb-3(b)(1), unless the authorization is terminated or revoked.  Performed at [facility], [address] [place], [place] [address]   Culture, blood (Routine X 2) w Reflex to ID Panel     Status: None   Collection Time: 05/20/21  8:46 PM   Specimen: BLOOD RIGHT FOREARM  Result Value Ref Range Status   Specimen Description BLOOD RIGHT FOREARM  Final   Special Requests   Final    BOTTLES DRAWN AEROBIC AND ANAEROBIC Blood Culture adequate volume   Culture   Final    NO GROWTH 5 DAYS Performed at [facility], [address] [place], [place] [address]    Report Status 05/25/2021 FINAL  Final  Culture, blood (Routine X 2) w Reflex to ID Panel     Status: None   Collection Time: 05/20/21  9:23 PM   Specimen: BLOOD LEFT ARM  Result Value Ref Range Status   Specimen Description BLOOD LEFT ARM  Final   Special Requests   Final    BOTTLES DRAWN AEROBIC AND ANAEROBIC Blood Culture adequate volume   Culture   Final    NO GROWTH 5 DAYS Performed at [facility],  [address] [place], [place] [address]    Report Status 05/25/2021 FINAL  Final         Radiology Studies: No results found.      Scheduled Meds:  (feeding supplement) PROSource Plus  30 mL Oral BID BM   atorvastatin  40 mg Oral Daily   gabapentin  100 mg Oral TID   lisinopril  10 mg Oral Daily   And   hydrochlorothiazide  12.5 mg Oral Daily   insulin aspart  0-15 Units Subcutaneous TID WC   insulin  aspart  0-5 Units Subcutaneous QHS   insulin glargine-yfgn  10 Units Subcutaneous QHS   nicotine  21 mg Transdermal Daily   oxybutynin  10 mg Oral QHS   QUEtiapine  200 mg Oral QHS   Continuous Infusions:  cefTRIAXone (ROCEPHIN)  IV 2 g (05/26/21 0518)   And   metronidazole 500 mg (05/26/21 0612)     LOS: 6 days    Time spent: 35 minutes    Kala Gassmann Hoover Brunette, DO [facility]  If 7PM-7AM, please contact night-coverage [email or web address] 05/26/2021, 3:45 PM

## 2021-05-26 NOTE — TOC Progression Note (Signed)
Transition of Care [facility]) - Progression Note    Patient Details  Name: [name] MRN: [id number] Date of Birth: April 29, 1961  Transition of Care [facility]) CM/SW Contact  Leitha Bleak, RN Phone Number: 05/26/2021, 12:44 PM  Clinical Narrative:   Bobbie Stack offered a bed also. [place] [facility] Norma has been unsuccessful in starting [employer]. TOC spoke with Mr. Katherina Right her son, he rather his mother stay in [place] and wants to accept the [facility] bed. TOC contacted Debbie to start insurance Auth.     Expected Discharge Plan: Skilled Nursing Facility Barriers to Discharge: Continued Medical Work up  Expected Discharge Plan and Services Expected Discharge Plan: Skilled Nursing Facility

## 2021-05-26 NOTE — Progress Notes (Signed)
Chaplain engaged in an initial visit with [name] and her son.  Son wants to become [employer] POA, which Letanya has agreed too as well.  Chaplain provided education on Advanced Directive.  Chaplain will not be available to complete AD after 12:30 pm today but a notary in house should be able to coordinate notary service.  Chaplain also explained that because son is next-of-kin, he is already the primary decision maker for his mom if she is unable to communicate for herself.      05/26/21 1200  Clinical Encounter Type  Visited With Patient and family together  Visit Type Initial;Social support

## 2021-05-27 DIAGNOSIS — L03116 Cellulitis of left lower limb: Secondary | ICD-10-CM | POA: Diagnosis not present

## 2021-05-27 LAB — GLUCOSE, CAPILLARY
Glucose-Capillary: 226 mg/dL — ABNORMAL HIGH (ref 70–99)
Glucose-Capillary: 129 mg/dL — ABNORMAL HIGH (ref 70–99)
Glucose-Capillary: 179 mg/dL — ABNORMAL HIGH (ref 70–99)
Glucose-Capillary: 172 mg/dL — ABNORMAL HIGH (ref 70–99)

## 2021-05-27 MED ORDER — GLIPIZIDE 5 MG PO TABS
5.0000 mg | ORAL_TABLET | Freq: Every day | ORAL | Status: DC
  Administered 2021-05-27 – 2021-05-30 (×4): 5 mg via ORAL
  Filled 2021-05-27 (×4): qty 1

## 2021-05-27 MED ORDER — METFORMIN HCL 500 MG PO TABS
500.0000 mg | ORAL_TABLET | Freq: Two times a day (BID) | ORAL | Status: DC
  Administered 2021-05-27 – 2021-05-30 (×7): 500 mg via ORAL
  Filled 2021-05-27 (×7): qty 1

## 2021-05-27 MED ORDER — INSULIN GLARGINE-YFGN 100 UNIT/ML ~~LOC~~ SOLN
10.0000 [IU] | Freq: Every day | SUBCUTANEOUS | Status: DC
  Administered 2021-05-27 – 2021-05-29 (×3): 10 [IU] via SUBCUTANEOUS
  Filled 2021-05-27 (×4): qty 0.1

## 2021-05-27 MED ORDER — AMOXICILLIN-POT CLAVULANATE 875-125 MG PO TABS
1.0000 | ORAL_TABLET | Freq: Two times a day (BID) | ORAL | Status: DC
  Administered 2021-05-28 – 2021-05-30 (×5): 1 via ORAL
  Filled 2021-05-27 (×5): qty 1

## 2021-05-27 NOTE — Progress Notes (Signed)
PROGRESS NOTE    [name]  ZOX:[id number] DOB: 02-05-1961 DOA: 05/20/2021 PCP: Avon Gully, MD   Brief Narrative:   60 y/o female with history of HTN, DM, presents to ED with left foot swelling. She denies any injury to this area. She was noted to have cellulitis of LLE and started on antibiotics. Plain films of left foot indicated divergent lisfranc dislocation. Orthopedics consulted.  Management will be nonoperative at this time.  Seen by PT with recommendations for skilled nursing facility placement.  She has a history of bipolar and has been restarted on Seroquel.  She does have signs of mania and is refusing skilled nursing facility placement.  Psychiatry consulted to help determine capacity as well as medication adjustments for bipolar and they have stated that patient now has capacity and is agreeable to going to SNF on discharge. Currently awaiting insurance approval.  Assessment & Plan:      LLE cellulitis Finished 7 days IV antibiotics - COntinue Augmentin 3 more days for 10 days total, this appears improved   -venous doppler negative for DVT -left ABI 0.94     Left foot divergent lisfranc dislocation NWB to LLE  -After discharge, patient will follow up with her podiatrist Dr. Allena Katz -Patient will follow-up with [facility] in outpatient setting per Dr. Romeo Apple   Diabetes Glucoses still elevated - Continue Lantus -Continue SS corrections  - Continue statin - Resume home metformin and glipizide   HTN BP normal - Continue lisinopril and HCTZ      Bipolar disorder - COntinue seroquel -patient continues to have some bizarre behavior and mania -review of records indicate that she was hospitalized for manic episode at [facility] in 03/2021 and was discharged on seroquel 200mg  qhs -son reports that he was unable to get this medicine, since it had reportedly not been called into her pharmacy -she does not have any established outpatient mental health  provider and will need to referred to [facility] -Seroquel was restarted on 8/22 -Psychiatry evaluation determines that patient has capacity and is agreeable to SNF placement     DVT prophylaxis: SCDs  Code Status: full code Family Communication: None at bedside Disposition Plan: Status is: Inpatient   Remains inpatient appropriate because: unsafe disposition   Dispo: The patient is from: Home              Anticipated d/c is to: SNF              Patient currently is medically stable to d/c.              Difficult to place patient No      Consultants:  Orthopedics Psychiatry   Procedures:   See below   Antimicrobials:  Ceftriaxone 8/21>8/27 Flagyl 8/21> 8/27 Augmentin 8/.28 >>  Subjective: No headache, chest pain, abdominal pain, confusion.  leg swelling is better.      Objective: Vitals:   05/26/21 1331 05/26/21 2046 05/27/21 0621 05/27/21 1404  BP: (!) 135/56 137/73 (!) 96/52 140/75  Pulse: 81 82 82 95  Resp:  18 18 18   Temp: 97.9 F (36.6 C) 98.2 F (36.8 C) 98 F (36.7 C) 98.1 F (36.7 C)  TempSrc: Oral Oral Oral   SpO2: 100% 97% 96% 100%  Weight:      Height:        Intake/Output Summary (Last 24 hours) at 05/27/2021 1638 Last data filed at 05/27/2021 1300 Gross per 24 hour  Intake 1080 ml  Output 550 ml  Net 530 ml   Filed Weights   05/20/21 1739 05/21/21 1633  Weight: 72.6 kg 67.9 kg    Examination:  General exam: Sitting in recliner, disheveled, grooming poor Respiratory system: Normal respiratory rate rhythm, lungs clear without rales or wheezes Cardiovascular system: Regular rate and rhythm, no murmurs, no lower extremity edema.  Gastrointestinal system: Abdomen is soft Central nervous system: Alert awake, moves upper extremities with normal strength and coordination, speech fluent Extremities: No edema, the left ankle is wrapped due to her ankle/Lisfranc fracture, but there is no redness or swelling around it, no pain to palpation Skin:  No significant lesions noted Psychiatry: Affect blunted, behavior odd, oriented to person, place, time, and situation    Data Reviewed: I have personally reviewed following labs and imaging studies  CBC: Recent Labs  Lab 05/20/21 2046 05/21/21 0449 05/22/21 0531 05/23/21 0603  WBC 4.8 13.7* 7.3 7.9  NEUTROABS 2.6 10.8*  --   --   HGB 14.7 11.3* 9.3* 10.6*  HCT 43.9 35.8* 29.0* 33.5*  MCV 92.0 96.5 96.3 96.8  PLT 216 368 318 344   Basic Metabolic Panel: Recent Labs  Lab 05/20/21 2046 05/21/21 0449 05/22/21 0531  NA 135 138 137  K 3.6 5.1 4.7  CL 99 99 101  CO2 29 32 30  GLUCOSE 168* 114* 146*  BUN 10 10 12   CREATININE 0.78 0.79 0.82  CALCIUM 8.5* 8.4* 8.3*  MG  --  2.0  --   PHOS  --  4.1  --    GFR: Estimated Creatinine Clearance: 65.7 mL/min (by C-G formula based on SCr of 0.82 mg/dL). Liver Function Tests: Recent Labs  Lab 05/20/21 2046 05/21/21 0449  AST 14* 14*  ALT 16 17  ALKPHOS 173* 182*  BILITOT 0.2* 0.6  PROT 6.7 7.2  ALBUMIN 3.1* 3.2*   No results for input(s): LIPASE, AMYLASE in the last 168 hours. No results for input(s): AMMONIA in the last 168 hours. Coagulation Profile: No results for input(s): INR, PROTIME in the last 168 hours. Cardiac Enzymes: No results for input(s): CKTOTAL, CKMB, CKMBINDEX, TROPONINI in the last 168 hours. BNP (last 3 results) No results for input(s): PROBNP in the last 8760 hours. HbA1C: Recent Labs    05/26/21 1631  HGBA1C 7.2*   CBG: Recent Labs  Lab 05/26/21 1824 05/26/21 2041 05/27/21 0713 05/27/21 1107 05/27/21 1623  GLUCAP 276* 331* 226* 129* 179*   Lipid Profile: No results for input(s): CHOL, HDL, LDLCALC, TRIG, CHOLHDL, LDLDIRECT in the last 72 hours. Thyroid Function Tests: No results for input(s): TSH, T4TOTAL, FREET4, T3FREE, THYROIDAB in the last 72 hours. Anemia Panel: No results for input(s): VITAMINB12, FOLATE, FERRITIN, TIBC, IRON, RETICCTPCT in the last 72 hours. Sepsis Labs: No  results for input(s): PROCALCITON, LATICACIDVEN in the last 168 hours.  Recent Results (from the past 240 hour(s))  Resp Panel by RT-PCR (Flu A&B, Covid) Nasopharyngeal Swab     Status: None   Collection Time: 05/20/21  8:46 PM   Specimen: Nasopharyngeal Swab; Nasopharyngeal(NP) swabs in vial transport medium  Result Value Ref Range Status   SARS Coronavirus 2 by RT PCR NEGATIVE NEGATIVE Final    Comment: (NOTE) SARS-CoV-2 target nucleic acids are NOT DETECTED.  The SARS-CoV-2 RNA is generally detectable in upper respiratory specimens during the acute phase of infection. The lowest concentration of SARS-CoV-2 viral copies this assay can detect is 138 copies/mL. A negative result does not preclude SARS-Cov-2 infection and should not be used as the sole  basis for treatment or other patient management decisions. A negative result may occur with  improper specimen collection/handling, submission of specimen other than nasopharyngeal swab, presence of viral mutation(s) within the areas targeted by this assay, and inadequate number of viral copies(<138 copies/mL). A negative result must be combined with clinical observations, patient history, and epidemiological information. The expected result is Negative.  Fact Sheet for Patients:  BloggerCourse.com  Fact Sheet for Healthcare Providers:  SeriousBroker.it  This test is no t yet approved or cleared by the [place] FDA and  has been authorized for detection and/or diagnosis of SARS-CoV-2 by FDA under an Emergency Use Authorization (EUA). This EUA will remain  in effect (meaning this test can be used) for the duration of the COVID-19 declaration under Section 564(b)(1) of the Act, 21 U.S.C.section 360bbb-3(b)(1), unless the authorization is terminated  or revoked sooner.       Influenza A by PCR NEGATIVE NEGATIVE Final   Influenza B by PCR NEGATIVE NEGATIVE Final    Comment:  (NOTE) The Xpert Xpress SARS-CoV-2/FLU/RSV plus assay is intended as an aid in the diagnosis of influenza from Nasopharyngeal swab specimens and should not be used as a sole basis for treatment. Nasal washings and aspirates are unacceptable for Xpert Xpress SARS-CoV-2/FLU/RSV testing.  Fact Sheet for Patients: BloggerCourse.com  Fact Sheet for Healthcare Providers: SeriousBroker.it  This test is not yet approved or cleared by the [place] FDA and has been authorized for detection and/or diagnosis of SARS-CoV-2 by FDA under an Emergency Use Authorization (EUA). This EUA will remain in effect (meaning this test can be used) for the duration of the COVID-19 declaration under Section 564(b)(1) of the Act, 21 U.S.C. section 360bbb-3(b)(1), unless the authorization is terminated or revoked.  Performed at [facility], [address] [place], [place] [address]   Culture, blood (Routine X 2) w Reflex to ID Panel     Status: None   Collection Time: 05/20/21  8:46 PM   Specimen: BLOOD RIGHT FOREARM  Result Value Ref Range Status   Specimen Description BLOOD RIGHT FOREARM  Final   Special Requests   Final    BOTTLES DRAWN AEROBIC AND ANAEROBIC Blood Culture adequate volume   Culture   Final    NO GROWTH 5 DAYS Performed at [facility], [address], [place], [place] [address]    Report Status 05/25/2021 FINAL  Final  Culture, blood (Routine X 2) w Reflex to ID Panel     Status: None   Collection Time: 05/20/21  9:23 PM   Specimen: BLOOD LEFT ARM  Result Value Ref Range Status   Specimen Description BLOOD LEFT ARM  Final   Special Requests   Final    BOTTLES DRAWN AEROBIC AND ANAEROBIC Blood Culture adequate volume   Culture   Final    NO GROWTH 5 DAYS Performed at [facility], [address] [place], [place] [address]    Report Status 05/25/2021 FINAL  Final         Radiology Studies: No results  found.      Scheduled Meds:  (feeding supplement) PROSource Plus  30 mL Oral BID BM   [START ON 05/28/2021] amoxicillin-clavulanate  1 tablet Oral Q12H   atorvastatin  40 mg Oral Daily   gabapentin  100 mg Oral TID   glipiZIDE  5 mg Oral QAC breakfast   lisinopril  10 mg Oral Daily   And   hydrochlorothiazide  12.5 mg Oral Daily   insulin aspart  0-20 Units Subcutaneous TID WC   insulin aspart  0-5 Units Subcutaneous QHS   insulin glargine-yfgn  10 Units Subcutaneous QHS   metFORMIN  500 mg Oral BID WC   nicotine  21 mg Transdermal Daily   oxybutynin  10 mg Oral QHS   QUEtiapine  200 mg Oral QHS   Continuous Infusions:  metronidazole 500 mg (05/27/21 0625)     LOS: 7 days    Time spent: 25 minutes    Alberteen Sam, MD [facility]  If 7PM-7AM, please contact night-coverage [email or web address] 05/27/2021, 4:38 PM

## 2021-05-28 DIAGNOSIS — L03116 Cellulitis of left lower limb: Secondary | ICD-10-CM | POA: Diagnosis not present

## 2021-05-28 LAB — GLUCOSE, CAPILLARY
Glucose-Capillary: 233 mg/dL — ABNORMAL HIGH (ref 70–99)
Glucose-Capillary: 111 mg/dL — ABNORMAL HIGH (ref 70–99)
Glucose-Capillary: 121 mg/dL — ABNORMAL HIGH (ref 70–99)
Glucose-Capillary: 321 mg/dL — ABNORMAL HIGH (ref 70–99)

## 2021-05-28 NOTE — Progress Notes (Signed)
PROGRESS NOTE    [name]  GMW:[id number] DOB: 1961-05-20 DOA: 05/20/2021 PCP: Avon Gully, MD   Brief Narrative:  Mrs. Maiers is a 60 y.o. F with Bipolar d/o, HTN, and DM who presented with left foot swelling redness and pain.  In the ER, radiographs showed divergent Lisfranc dislocation.  Orthopedics were consulted and recommended nonoperative management, nonweightbearing, outpatient follow-up.  She was treated with antibiotics for cellulitis and psychiatry were consulted determine capacity.        Assessment & Plan:   Left lower extremity cellulitis Finish 7 days of IV antibiotics, appears to be improving  Dopplers negative for DVT.  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 Allena Katz, podiatry after discharge - Follow-up with orthopedic surgery, Dr. Orson Aloe [facility] after discharge   Diabetes Glucoses improved but still slightly elevated - Continue Lantus - Continue sliding scale corrections - Continue metformin and glipizide - Continue statin    Hypertension BP controlled - Continue lisinopril and HCTZ     Bipolar disorder Patient exhibited some bizarre behavior, erratic behavior, and what appeared to be mania.  Psychiatry were consulted and recommended resuming outpatient Seroquel (she had been discharged from [facility] in July on Seroquel 200 nightly but had not pick the medicine up from her pharmacy or had not been sent the medicine to her pharmacy).  Psychiatry felt that the patient has capacity for decision-making.  Patient agreeable to SNF placement. - Continue Seroquel           DVT prophylaxis: SCDs  Code Status: FULL Family Communication: None at bedside Disposition Plan: Status is: Inpatient   Remains inpatient appropriate because: unsafe disposition   Dispo: The patient is  from: Home              Anticipated d/c is to: SNF              Patient currently is medically stable to d/c.              Difficult to place patient No      Consultants:  Orthopedics Psychiatry   Procedures:   See below   Antimicrobials:  Ceftriaxone 8/21>8/27 Flagyl 8/21> 8/27 Augmentin 8/.28 >>  Subjective: No new fever, headache, chest pain, confusion, abdominal pain.      Objective: Vitals:   05/27/21 1404 05/27/21 1935 05/28/21 0606 05/28/21 1259  BP: 140/75 (!) 136/56 (!) 106/41 (!) 118/58  Pulse: 95 78 69 82  Resp: 18 18 18 16   Temp: 98.1 F (36.7 C) 98.4 F (36.9 C) 98.6 F (37 C) 98.8 F (37.1 C)  TempSrc:  Axillary  Oral  SpO2: 100% 98% 100% 99%  Weight:      Height:        Intake/Output Summary (Last 24 hours) at 05/28/2021 1540 Last data filed at 05/28/2021 1400 Gross per 24 hour  Intake 1840 ml  Output 1150 ml  Net 690 ml   Filed Weights   05/20/21 1739 05/21/21 1633  Weight: 72.6 kg 67.9 kg    Examination:  General appearance: Chronic, disheveled, eating with her fingers.     HEENT: Edentulous, lips normal, oropharynx moist, no oral lesions. Skin:  Cardiac: RRR, no murmurs, no lower extremity edema Respiratory: Normal respiratory rate and rhythm, lungs clear without rales or wheezes. Abdomen: Abdomen soft no  tenderness palpation or guarding, no ascites or distention. MSK: The left ankle is wrapped in Ace wrap.  There is no redness or swelling. Neuro: Awake and alert, extraocular movements intact, moves upper extremities and normal strength and coordination, speech fluent, face symmetric. Psych: Behavior, oriented to person, place, time, affect blunted, judgment seems impaired.     Data Reviewed: I have personally reviewed following labs and imaging studies  CBC: Recent Labs  Lab 05/22/21 0531 05/23/21 0603  WBC 7.3 7.9  HGB 9.3* 10.6*  HCT 29.0* 33.5*  MCV 96.3 96.8  PLT 318 344   Basic Metabolic Panel: Recent Labs  Lab  05/22/21 0531  NA 137  K 4.7  CL 101  CO2 30  GLUCOSE 146*  BUN 12  CREATININE 0.82  CALCIUM 8.3*   GFR: Estimated Creatinine Clearance: 65.7 mL/min (by C-G formula based on SCr of 0.82 mg/dL). Liver Function Tests: No results for input(s): AST, ALT, ALKPHOS, BILITOT, PROT, ALBUMIN in the last 168 hours.  No results for input(s): LIPASE, AMYLASE in the last 168 hours. No results for input(s): AMMONIA in the last 168 hours. Coagulation Profile: No results for input(s): INR, PROTIME in the last 168 hours. Cardiac Enzymes: No results for input(s): CKTOTAL, CKMB, CKMBINDEX, TROPONINI in the last 168 hours. BNP (last 3 results) No results for input(s): PROBNP in the last 8760 hours. HbA1C: Recent Labs    05/26/21 1631  HGBA1C 7.2*   CBG: Recent Labs  Lab 05/27/21 1107 05/27/21 1623 05/27/21 2027 05/28/21 0715 05/28/21 1105  GLUCAP 129* 179* 172* 233* 111*   Lipid Profile: No results for input(s): CHOL, HDL, LDLCALC, TRIG, CHOLHDL, LDLDIRECT in the last 72 hours. Thyroid Function Tests: No results for input(s): TSH, T4TOTAL, FREET4, T3FREE, THYROIDAB in the last 72 hours. Anemia Panel: No results for input(s): VITAMINB12, FOLATE, FERRITIN, TIBC, IRON, RETICCTPCT in the last 72 hours. Sepsis Labs: No results for input(s): PROCALCITON, LATICACIDVEN in the last 168 hours.  Recent Results (from the past 240 hour(s))  Resp Panel by RT-PCR (Flu A&B, Covid) Nasopharyngeal Swab     Status: None   Collection Time: 05/20/21  8:46 PM   Specimen: Nasopharyngeal Swab; Nasopharyngeal(NP) swabs in vial transport medium  Result Value Ref Range Status   SARS Coronavirus 2 by RT PCR NEGATIVE NEGATIVE Final    Comment: (NOTE) SARS-CoV-2 target nucleic acids are NOT DETECTED.  The SARS-CoV-2 RNA is generally detectable in upper respiratory specimens during the acute phase of infection. The lowest concentration of SARS-CoV-2 viral copies this assay can detect is 138 copies/mL. A  negative result does not preclude SARS-Cov-2 infection and should not be used as the sole basis for treatment or other patient management decisions. A negative result may occur with  improper specimen collection/handling, submission of specimen other than nasopharyngeal swab, presence of viral mutation(s) within the areas targeted by this assay, and inadequate number of viral copies(<138 copies/mL). A negative result must be combined with clinical observations, patient history, and epidemiological information. The expected result is Negative.  Fact Sheet for Patients:  BloggerCourse.com  Fact Sheet for Healthcare Providers:  SeriousBroker.it  This test is no t yet approved or cleared by the [place] FDA and  has been authorized for detection and/or diagnosis of SARS-CoV-2 by FDA under an Emergency Use Authorization (EUA). This EUA will remain  in effect (meaning this test can be used) for the duration of the COVID-19 declaration under Section 564(b)(1) of the Act, 21 U.S.C.section 360bbb-3(b)(1), unless the authorization is  terminated  or revoked sooner.       Influenza A by PCR NEGATIVE NEGATIVE Final   Influenza B by PCR NEGATIVE NEGATIVE Final    Comment: (NOTE) The Xpert Xpress SARS-CoV-2/FLU/RSV plus assay is intended as an aid in the diagnosis of influenza from Nasopharyngeal swab specimens and should not be used as a sole basis for treatment. Nasal washings and aspirates are unacceptable for Xpert Xpress SARS-CoV-2/FLU/RSV testing.  Fact Sheet for Patients: BloggerCourse.com  Fact Sheet for Healthcare Providers: SeriousBroker.it  This test is not yet approved or cleared by the [place] FDA and has been authorized for detection and/or diagnosis of SARS-CoV-2 by FDA under an Emergency Use Authorization (EUA). This EUA will remain in effect (meaning this test can  be used) for the duration of the COVID-19 declaration under Section 564(b)(1) of the Act, 21 U.S.C. section 360bbb-3(b)(1), unless the authorization is terminated or revoked.  Performed at [facility], [address], [place], [place] [address]   Culture, blood (Routine X 2) w Reflex to ID Panel     Status: None   Collection Time: 05/20/21  8:46 PM   Specimen: BLOOD RIGHT FOREARM  Result Value Ref Range Status   Specimen Description BLOOD RIGHT FOREARM  Final   Special Requests   Final    BOTTLES DRAWN AEROBIC AND ANAEROBIC Blood Culture adequate volume   Culture   Final    NO GROWTH 5 DAYS Performed at [facility], [address], [place], [place] [address]    Report Status 05/25/2021 FINAL  Final  Culture, blood (Routine X 2) w Reflex to ID Panel     Status: None   Collection Time: 05/20/21  9:23 PM   Specimen: BLOOD LEFT ARM  Result Value Ref Range Status   Specimen Description BLOOD LEFT ARM  Final   Special Requests   Final    BOTTLES DRAWN AEROBIC AND ANAEROBIC Blood Culture adequate volume   Culture   Final    NO GROWTH 5 DAYS Performed at [facility], [address], [place], [place] [address]    Report Status 05/25/2021 FINAL  Final         Radiology Studies: No results found.      Scheduled Meds:  (feeding supplement) PROSource Plus  30 mL Oral BID BM   amoxicillin-clavulanate  1 tablet Oral Q12H   atorvastatin  40 mg Oral Daily   gabapentin  100 mg Oral TID   glipiZIDE  5 mg Oral QAC breakfast   lisinopril  10 mg Oral Daily   And   hydrochlorothiazide  12.5 mg Oral Daily   insulin aspart  0-20 Units Subcutaneous TID WC   insulin aspart  0-5 Units Subcutaneous QHS   insulin glargine-yfgn  10 Units Subcutaneous QHS   metFORMIN  500 mg Oral BID WC   nicotine  21 mg Transdermal Daily   oxybutynin  10 mg Oral QHS   QUEtiapine  200 mg Oral QHS   Continuous Infusions:     LOS: 8 days    Time spent: 25 minutes    Alberteen Sam,  MD [facility]  If 7PM-7AM, please contact night-coverage [email or web address] 05/28/2021, 3:40 PM

## 2021-05-28 NOTE — Plan of Care (Signed)

## 2021-05-29 DIAGNOSIS — L03116 Cellulitis of left lower limb: Secondary | ICD-10-CM | POA: Diagnosis not present

## 2021-05-29 LAB — GLUCOSE, CAPILLARY
Glucose-Capillary: 136 mg/dL — ABNORMAL HIGH (ref 70–99)
Glucose-Capillary: 198 mg/dL — ABNORMAL HIGH (ref 70–99)
Glucose-Capillary: 143 mg/dL — ABNORMAL HIGH (ref 70–99)
Glucose-Capillary: 314 mg/dL — ABNORMAL HIGH (ref 70–99)

## 2021-05-29 NOTE — Plan of Care (Signed)

## 2021-05-29 NOTE — Progress Notes (Addendum)
Nutrition Follow up   INTERVENTION:  Continue ProSource Plus 30 ml BID (each 30 ml provides 100 kcal, 15 gr protein)   Continue snack from nourishment room as desired  NUTRITION DIAGNOSIS:   Increased nutrient needs related to  (acute cellutitis) as evidenced by estimated needs.   GOAL:  Patient will meet greater than or equal to 90% of their needs   MONITOR:  PO intake, Supplement acceptance, Skin, Weight trends   ASSESSMENT: Patient is a 60 yo female with history of DM2, HTN and bipolar disorder. Presents with left foot cellulitis and  left foot divergent lisfranc dislocation (no surgical intervention currently). Patient is a tobacco smoker.   RD consult due to wound healing. Po intake is excellent- 100% of documented meals. Able to feed herself and verbalize meal selections within the therapeutic restrictions of her HH/CHO mod diet. Expect pt is meeting est nutrition needs based on meals and observed intake.  Weight has ranged between 68-70 kg the past 3+ months and wt was recorded as 74.4 kg last July. No significant change noted.    8/29- patient continues to eat well PO: 100% of all meals. No change to nutrition care recommended.Expect she is meeting est nutrition needs noted below. Cellulitis is improving per MD.  Patient is waiting placement. Last BM???   Medications reviewed.   Labs: BMP Latest Ref Rng & Units 05/22/2021 05/21/2021 05/20/2021  Glucose 70 - 99 mg/dL 833(A) 250(N) 397(Q)  BUN 6 - 20 mg/dL 12 10 10   Creatinine 0.44 - 1.00 mg/dL 7.34 1.93  Sodium 135 - 145 mmol/L 137 138 135  Potassium 3.5 - 5.1 mmol/L 4.7 5.1 3.6  Chloride 98 - 111 mmol/L 101 99 99  CO2 22 - 32 mmol/L 30 32 29  Calcium 8.9 - 10.3 mg/dL 8.3(L) 8.4(L) 8.5(L)      Diet Order:   Diet Order             Diet heart healthy/carb modified Room service appropriate? Yes; Fluid consistency: Thin  Diet effective now                   EDUCATION NEEDS:  Not appropriate for education  at this time  Skin:  Skin Assessment: Reviewed RN Assessment. No skin breakdown per nursing assessment.   Last BM:  pt unable to remember  Height:   Ht Readings from Last 1 Encounters:  05/21/21 5\' 5"  (1.651 m)    Weight:   Wt Readings from Last 1 Encounters:  05/21/21 67.9 kg    Ideal Body Weight:   57 kg   BMI:  Body mass index is 24.91 kg/m.  Estimated Nutritional Needs:   Kcal:  1700-1836  Protein:  82-88 gr  Fluid:  >1700 ml daily   MS,RD,CSG,LDN Contact: 05/23/21

## 2021-05-29 NOTE — Progress Notes (Signed)
PROGRESS NOTE    [name]  WGN:[id number] DOB: [birth year] DOA: 05/20/2021 PCP: Avon Gully, MD   Brief Narrative:   60 y/o female with history of HTN, DM, presents to ED with left foot swelling. She denies any injury to this area. She was noted to have cellulitis of LLE and started on antibiotics. Plain films of left foot indicated divergent lisfranc dislocation. Orthopedics consulted.  Management will be nonoperative at this time.  Seen by PT with recommendations for skilled nursing facility placement.  She has a history of bipolar and has been restarted on Seroquel.  She does have signs of mania and is refusing skilled nursing facility placement.  Psychiatry consulted to help determine capacity as well as medication adjustments for bipolar and they have stated that patient now has capacity and is agreeable to going to SNF on discharge. Currently awaiting insurance approval.  Assessment & Plan:   Active Problems:   HTN (hypertension)   Tobacco abuse   Hyperlipidemia   DM2 (diabetes mellitus, type 2) (HCC)   Cellulitis   LLE cellulitis -Continue Augmentin for 1 more day for total 10 days -overall erythema improving -venous doppler negative for DVT -left ABI 0.94 -keep LE elevated   Left foot divergent lisfranc dislocation -will keep patient NWB on LLE -appreciate ortho input -patient lives alone and will need PT evaluation for safety of discharge home -Seen by physical therapy with recommendations for skilled nursing facility placement -After discharge, patient will follow up with her podiatrist Dr. Allena Katz -Patient will follow-up with [facility] in outpatient setting per Dr. Virgel Bouquet hyperglycemia -increase basal insulin -increase sliding scale   HTN -currently on lisinopril and HCTZ -BP stable   HLD -continue on statin   Bipolar disorder -patient continues to have some bizarre behavior and mania -review of records indicate that she was  hospitalized for manic episode at [facility] in 03/2021 and was discharged on seroquel 200mg  qhs -son reports that he was unable to get this medicine, since it had reportedly not been called into her pharmacy -she does not have any established outpatient mental health provider and will need to referred to [facility] -Seroquel was restarted on 8/22 -Psychiatry evaluation determines that patient has capacity and is agreeable to SNF placement     DVT prophylaxis: SCDs  Code Status: full code Family Communication: None at bedside Disposition Plan: Status is: Inpatient   Remains inpatient appropriate because:IV treatments appropriate due to intensity of illness or inability to take PO and Inpatient level of care appropriate due to severity of illness   Dispo: The patient is from: Home              Anticipated d/c is to: SNF              Patient currently is not medically stable to d/c.              Difficult to place patient No      Consultants:  Orthopedics Psychiatry   Procedures:   See below   Antimicrobials:  Ceftriaxone 8/21>8/27 Flagyl 8/21> 8/27 Augmentin 8/28>   Subjective: Patient seen and evaluated today with no new acute complaints or concerns. No acute concerns or events noted overnight.  Objective: Vitals:   05/28/21 1259 05/28/21 2130 05/29/21 0614 05/29/21 1407  BP: (!) 118/58 140/65 (!) 108/58 117/63  Pulse: 82 75 73 100  Resp: 16 18 18 17   Temp: 98.8 F (37.1 C) (!) 97.2 F (36.2 C) (!)  97 F (36.1 C) 98.2 F (36.8 C)  TempSrc: Oral   Oral  SpO2: 99% 100% 98% 99%  Weight:      Height:        Intake/Output Summary (Last 24 hours) at 05/29/2021 1610 Last data filed at 05/29/2021 1300 Gross per 24 hour  Intake 1890 ml  Output 100 ml  Net 1790 ml   Filed Weights   05/20/21 1739 05/21/21 1633  Weight: 72.6 kg 67.9 kg    Examination:  General exam: Appears calm and comfortable  Respiratory system: Clear to auscultation. Respiratory effort  normal. Cardiovascular system: S1 & S2 heard, RRR.  Gastrointestinal system: Abdomen is soft Central nervous system: Alert and awake Extremities: No edema Skin: No significant lesions noted Psychiatry: Flat affect.    Data Reviewed: I have personally reviewed following labs and imaging studies  CBC: Recent Labs  Lab 05/23/21 0603  WBC 7.9  HGB 10.6*  HCT 33.5*  MCV 96.8  PLT 344   Basic Metabolic Panel: No results for input(s): NA, K, CL, CO2, GLUCOSE, BUN, CREATININE, CALCIUM, MG, PHOS in the last 168 hours. GFR: Estimated Creatinine Clearance: 65.7 mL/min (by C-G formula based on SCr of 0.82 mg/dL). Liver Function Tests: No results for input(s): AST, ALT, ALKPHOS, BILITOT, PROT, ALBUMIN in the last 168 hours. No results for input(s): LIPASE, AMYLASE in the last 168 hours. No results for input(s): AMMONIA in the last 168 hours. Coagulation Profile: No results for input(s): INR, PROTIME in the last 168 hours. Cardiac Enzymes: No results for input(s): CKTOTAL, CKMB, CKMBINDEX, TROPONINI in the last 168 hours. BNP (last 3 results) No results for input(s): PROBNP in the last 8760 hours. HbA1C: Recent Labs    05/26/21 1631  HGBA1C 7.2*   CBG: Recent Labs  Lab 05/28/21 1105 05/28/21 1616 05/28/21 2128 05/29/21 0744 05/29/21 1104  GLUCAP 111* 121* 321* 136* 198*   Lipid Profile: No results for input(s): CHOL, HDL, LDLCALC, TRIG, CHOLHDL, LDLDIRECT in the last 72 hours. Thyroid Function Tests: No results for input(s): TSH, T4TOTAL, FREET4, T3FREE, THYROIDAB in the last 72 hours. Anemia Panel: No results for input(s): VITAMINB12, FOLATE, FERRITIN, TIBC, IRON, RETICCTPCT in the last 72 hours. Sepsis Labs: No results for input(s): PROCALCITON, LATICACIDVEN in the last 168 hours.  Recent Results (from the past 240 hour(s))  Resp Panel by RT-PCR (Flu A&B, Covid) Nasopharyngeal Swab     Status: None   Collection Time: 05/20/21  8:46 PM   Specimen: Nasopharyngeal  Swab; Nasopharyngeal(NP) swabs in vial transport medium  Result Value Ref Range Status   SARS Coronavirus 2 by RT PCR NEGATIVE NEGATIVE Final    Comment: (NOTE) SARS-CoV-2 target nucleic acids are NOT DETECTED.  The SARS-CoV-2 RNA is generally detectable in upper respiratory specimens during the acute phase of infection. The lowest concentration of SARS-CoV-2 viral copies this assay can detect is 138 copies/mL. A negative result does not preclude SARS-Cov-2 infection and should not be used as the sole basis for treatment or other patient management decisions. A negative result may occur with  improper specimen collection/handling, submission of specimen other than nasopharyngeal swab, presence of viral mutation(s) within the areas targeted by this assay, and inadequate number of viral copies(<138 copies/mL). A negative result must be combined with clinical observations, patient history, and epidemiological information. The expected result is Negative.  Fact Sheet for Patients:  BloggerCourse.com  Fact Sheet for Healthcare Providers:  SeriousBroker.it  This test is no t yet approved or cleared by the [place]  States FDA and  has been authorized for detection and/or diagnosis of SARS-CoV-2 by FDA under an Emergency Use Authorization (EUA). This EUA will remain  in effect (meaning this test can be used) for the duration of the COVID-19 declaration under Section 564(b)(1) of the Act, 21 U.S.C.section 360bbb-3(b)(1), unless the authorization is terminated  or revoked sooner.       Influenza A by PCR NEGATIVE NEGATIVE Final   Influenza B by PCR NEGATIVE NEGATIVE Final    Comment: (NOTE) The Xpert Xpress SARS-CoV-2/FLU/RSV plus assay is intended as an aid in the diagnosis of influenza from Nasopharyngeal swab specimens and should not be used as a sole basis for treatment. Nasal washings and aspirates are unacceptable for Xpert Xpress  SARS-CoV-2/FLU/RSV testing.  Fact Sheet for Patients: BloggerCourse.com  Fact Sheet for Healthcare Providers: SeriousBroker.it  This test is not yet approved or cleared by the [place] FDA and has been authorized for detection and/or diagnosis of SARS-CoV-2 by FDA under an Emergency Use Authorization (EUA). This EUA will remain in effect (meaning this test can be used) for the duration of the COVID-19 declaration under Section 564(b)(1) of the Act, 21 U.S.C. section 360bbb-3(b)(1), unless the authorization is terminated or revoked.  Performed at [facility], [address], [place], [place] [address]   Culture, blood (Routine X 2) w Reflex to ID Panel     Status: None   Collection Time: 05/20/21  8:46 PM   Specimen: BLOOD RIGHT FOREARM  Result Value Ref Range Status   Specimen Description BLOOD RIGHT FOREARM  Final   Special Requests   Final    BOTTLES DRAWN AEROBIC AND ANAEROBIC Blood Culture adequate volume   Culture   Final    NO GROWTH 5 DAYS Performed at [facility], [address], [place], [place] [address]    Report Status 05/25/2021 FINAL  Final  Culture, blood (Routine X 2) w Reflex to ID Panel     Status: None   Collection Time: 05/20/21  9:23 PM   Specimen: BLOOD LEFT ARM  Result Value Ref Range Status   Specimen Description BLOOD LEFT ARM  Final   Special Requests   Final    BOTTLES DRAWN AEROBIC AND ANAEROBIC Blood Culture adequate volume   Culture   Final    NO GROWTH 5 DAYS Performed at [facility], [address] [place], [place] [address]    Report Status 05/25/2021 FINAL  Final         Radiology Studies: No results found.      Scheduled Meds:  (feeding supplement) PROSource Plus  30 mL Oral BID BM   amoxicillin-clavulanate  1 tablet Oral Q12H   atorvastatin  40 mg Oral Daily   gabapentin  100 mg Oral TID   glipiZIDE  5 mg Oral QAC breakfast   lisinopril  10 mg Oral Daily   And    hydrochlorothiazide  12.5 mg Oral Daily   insulin aspart  0-20 Units Subcutaneous TID WC   insulin aspart  0-5 Units Subcutaneous QHS   insulin glargine-yfgn  10 Units Subcutaneous QHS   metFORMIN  500 mg Oral BID WC   nicotine  21 mg Transdermal Daily   oxybutynin  10 mg Oral QHS   QUEtiapine  200 mg Oral QHS     LOS: 9 days    Time spent: 35 minutes    Aliany Fiorenza Hoover Brunette, DO [facility]  If 7PM-7AM, please contact night-coverage [email or web address] 05/29/2021, 4:10 PM

## 2021-05-30 DIAGNOSIS — L03116 Cellulitis of left lower limb: Secondary | ICD-10-CM | POA: Diagnosis not present

## 2021-05-30 LAB — GLUCOSE, CAPILLARY
Glucose-Capillary: 180 mg/dL — ABNORMAL HIGH (ref 70–99)
Glucose-Capillary: 145 mg/dL — ABNORMAL HIGH (ref 70–99)

## 2021-05-30 MED ORDER — QUETIAPINE FUMARATE 200 MG PO TABS: 200.0000 mg | ORAL_TABLET | Freq: Every day | ORAL | 0 refills | Status: DC

## 2021-05-30 MED ORDER — OXYCODONE HCL 5 MG PO TABS: 5.0000 mg | ORAL_TABLET | ORAL | 0 refills | Status: DC | PRN

## 2021-05-30 MED ORDER — PROSOURCE PLUS PO LIQD: 30.0000 mL | Freq: Two times a day (BID) | ORAL | 3 refills | Status: DC

## 2021-05-30 NOTE — TOC Transition Note (Signed)
Transition of Care 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 Son updated. RN to call report and call [facility] transport for transportation. DC summary sent to Debbie at [facility].   Final next level of care: Skilled Nursing Facility Barriers to Discharge: Barriers Resolved   Patient Goals and CMS Choice Patient states their goals for this hospitalization and ongoing recovery are:: to go to SNF CMS Medicare.gov Compare Post Acute Care list provided to:: Patient Choice offered to / list presented to : Patient  Discharge Placement         Patient to be transferred to facility by: [facility]   Patient and family notified of of transfer: 05/30/21  Discharge Plan and Services    [facility]       Readmission Risk Interventions Readmission Risk Prevention Plan 05/23/2021  Transportation Screening Complete  HRI or Home Care Consult Complete  Social Work Consult for Recovery Care Planning/Counseling Complete  Palliative Care Screening Not Applicable  Medication Review [employer]) Complete  Some recent data might be hidden

## 2021-05-30 NOTE — Progress Notes (Signed)
Pt has discharge orders. Report called and given to luanne at [facility] nursing center, no further questions at this time. Discharge packet created and script placed in packet. [facility] transport came to pick up pt with w/c.

## 2021-05-30 NOTE — Discharge Summary (Signed)
Physician Discharge Summary  [name] MVH:[id number] DOB: 16-Sep-1961 DOA: 05/20/2021  PCP: Rosita Fire, MD  Admit date: 05/20/2021  Discharge date: 05/30/2021  Admitted From:Home  Disposition:  SNF  Recommendations for Outpatient Follow-up:  Follow up with PCP in 1-2 weeks Follow-up with podiatry with referral sent regarding left foot divergent Lisfranc dislocation Patient has completed treatment for left lower extremity cellulitis Oxycodone prescribed for 10 tablets and 0 refills Seroquel refilled  Home Health: None  Equipment/Devices: None  Discharge Condition:Stable  CODE STATUS: Full  Diet recommendation: Heart Healthy/carb modified  Brief/Interim Summary:  60 y/o female with history of HTN, DM, presents to ED with left foot swelling. She denies any injury to this area. She was noted to have cellulitis of LLE and started on antibiotics with 10 day course completed. Plain films of left foot indicated divergent lisfranc dislocation. Orthopedics consulted.  Management will be nonoperative at this time.  Seen by PT with recommendations for skilled nursing facility placement.  She has a history of bipolar and has been restarted on Seroquel.  She does have signs of mania and is refusing skilled nursing facility placement.  Psychiatry consulted to help determine capacity as well as medication adjustments for bipolar and they have stated that patient now has capacity and is agreeable to going to SNF on discharge.  Discharge Diagnoses:  Active Problems:   HTN (hypertension)   Tobacco abuse   Hyperlipidemia   DM2 (diabetes mellitus, type 2) ([place])   Cellulitis  Principal discharge diagnosis: Left foot divergent Lisfranc dislocation with left lower extremity cellulitis.  Discharge Instructions  Discharge Instructions     Ambulatory referral to Podiatry   Complete by: As directed    Diet - low sodium heart healthy   Complete by: As directed    Increase activity  slowly   Complete by: As directed       Allergies as of 05/30/2021   No Known Allergies      Medication List     STOP taking these medications    aspirin 325 MG tablet   atorvastatin 40 MG tablet Commonly known as: LIPITOR   insulin lispro 100 UNIT/ML KwikPen Commonly known as: HUMALOG   oxyCODONE-acetaminophen 5-325 MG tablet Commonly known as: Percocet       TAKE these medications    (feeding supplement) PROSource Plus liquid Take 30 mLs by mouth 2 (two) times daily between meals. Start taking on: May 31, 2021   Accu-Chek Guide test strip Generic drug: glucose blood USE TO TEST 3 TIMESDDAILY.   Accu-Chek Softclix Lancets lancets 3 (three) times daily.   acetaminophen 325 MG tablet Commonly known as: TYLENOL Take 650 mg by mouth every 6 (six) hours as needed for mild pain.   ascorbic acid 500 MG tablet Commonly known as: VITAMIN C Take 1 tablet (500 mg total) by mouth daily.   blood glucose meter kit and supplies Dispense based on patient and insurance preference. Use up to four times daily as directed. (FOR ICD-10 E10.9, E11.9).   cholecalciferol 25 MCG (1000 UNIT) tablet Commonly known as: VITAMIN D3 Take 1,000 Units by mouth daily.   gabapentin 100 MG capsule Commonly known as: NEURONTIN Take 1 capsule (100 mg total) by mouth 3 (three) times daily.   glipiZIDE 5 MG tablet Commonly known as: GLUCOTROL Take 5 mg by mouth 2 (two) times daily.   insulin glargine 100 UNIT/ML Solostar Pen Commonly known as: LANTUS Inject 10 Units into the skin at bedtime.   Insulin  Pen Needle 31G X 5 MM Misc 1 Device by Does not apply route as directed.   lisinopril-hydrochlorothiazide 10-12.5 MG tablet Commonly known as: ZESTORETIC Take 1 tablet by mouth daily.   metFORMIN 850 MG tablet Commonly known as: GLUCOPHAGE Take 850 mg by mouth 2 (two) times daily.   oxybutynin 10 MG 24 hr tablet Commonly known as: DITROPAN-XL Take 1 tablet (10 mg total) by  mouth at bedtime.   oxyCODONE 5 MG immediate release tablet Commonly known as: Oxy IR/ROXICODONE Take 1 tablet (5 mg total) by mouth every 4 (four) hours as needed for moderate pain.   ProAir HFA 108 (90 Base) MCG/ACT inhaler Generic drug: albuterol Inhale 2 puffs into the lungs every 4 (four) hours as needed for wheezing.   QUEtiapine 200 MG tablet Commonly known as: SEROQUEL Take 1 tablet (200 mg total) by mouth at bedtime.        Contact information for follow-up providers     Services, [facility] Follow up.   Contact information: [place] [address] [phone]         [facility]. Schedule an appointment as soon as possible for a visit in 1 week(s).               Contact information for after-discharge care     [facility] .   Service: Skilled Nursing Contact information: [address] [place] [phone]                    No Known Allergies  Consultations: Orthopedics Psychiatry   Procedures/Studies: US Venous Img Lower Unilateral Left (DVT)  Result Date: 05/21/2021 CLINICAL DATA:  Left lower extremity swelling. Left second toe amputation. Left foot fractures. Diabetic nonhealing left foot wounds. EXAM: LEFT LOWER EXTREMITY VENOUS DOPPLER ULTRASOUND TECHNIQUE: Gray-scale sonography with compression, as well as color and duplex ultrasound, were performed to evaluate the deep venous system(s) from the level of the common femoral vein through the popliteal and proximal calf veins. COMPARISON:  None. FINDINGS: VENOUS Normal compressibility of the common femoral, superficial femoral, and popliteal veins, as well as the visualized calf veins. Visualized portions of profunda femoral vein and great saphenous vein unremarkable. No filling defects to suggest DVT on grayscale or color Doppler imaging. Doppler waveforms show normal direction of  venous flow, normal respiratory plasticity and response to augmentation. Limited views of the contralateral common femoral vein are unremarkable. OTHER Soft tissue edema noted in the left leg. There are abnormal left inguinal lymph nodes, enlarged cortical thickening, but overall maintaining normal morphologic shapes. Largest node measures 4 cm in long axis, 1.5 cm in short axis and has a cortical thickness up to 9 mm. Limitations: none IMPRESSION: 1. No evidence of a left lower extremity deep venous thrombosis. 2. Left lower extremity nonspecific edema. 3. Enlarged left inguinal lymph nodes, which are presumably reactive given the reported left foot wounds. Electronically Signed   By: Lajean Manes M.D.   On: 05/21/2021 11:10   US ARTERIAL ABI (SCREENING LOWER EXTREMITY)  Result Date: 05/21/2021 CLINICAL DATA:  Current smoker Hypertension Hyperlipidemia Diabetes EXAM: NONINVASIVE PHYSIOLOGIC VASCULAR STUDY OF BILATERAL LOWER EXTREMITIES TECHNIQUE: Evaluation of both lower extremities were performed at rest, including calculation of ankle-brachial indices with single level Doppler, pressure and pulse volume recording. COMPARISON:  None. FINDINGS: Right ABI:  1.16 Left ABI:  0.94 Right Lower Extremity:  Normal arterial waveforms at the  ankle. Left Lower Extremity:  Normal arterial waveforms at the ankle. 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 Electronically Signed   By: Miachel Roux M.D.   On: 05/21/2021 10:25   DG Foot Complete Left  Result Date: 05/20/2021 CLINICAL DATA:  Swelling, erythema, history of diabetes EXAM: LEFT FOOT - COMPLETE 3+ VIEW COMPARISON:  03/12/2021 FINDINGS: Frontal, oblique, and lateral views of the left foot are obtained. There is been a previous amputation mid aspect second metatarsal. 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 There is a comminuted intra-articular fracture at the  base of the first metatarsal. Suspected intra-articular fractures of the base of the second and third metatarsals. There is lateral and dorsal dislocation of the second through fifth tarsometatarsal joints, as well as dorsal displacement of the first metatarsal relative to the medial cuboid. Additionally, there is evidence of a stress or insufficiency fracture involving the third metatarsal, with linear sclerosis and buttressing involving the third metatarsal diaphysis. There is diffuse soft tissue swelling. IMPRESSION: 1. Fracture dislocations of the first through fifth tarsometatarsal joints as above, consistent with divergent Lisfranc deformity. 2. Stress or insufficiency fracture involving the third metatarsal, new since prior study. 3. Previous second metatarsal amputation. 4. Diffuse soft tissue swelling. Electronically Signed   By: Randa Ngo M.D.   On: 05/20/2021 21:36     Discharge Exam: Vitals:   05/30/21 0951 05/30/21 1224  BP: (!) 94/59 (!) 127/59  Pulse: 61 72  Resp:  16  Temp: 97.7 F (36.5 C)   SpO2: 98% 98%   Vitals:   05/29/21 2118 05/30/21 0441 05/30/21 0951 05/30/21 1224  BP: 126/68 (!) 98/48 (!) 94/59 (!) 127/59  Pulse: 87 69 61 72  Resp: _0 Temp: 98.4 F (36.9 C) (!) 97.4 F (36.3 C) 97.7 F (36.5 C)   TempSrc: Oral  Oral   SpO2: 98% 100% 98% 98%  Weight:      Height:        General: Pt is alert, awake, not in acute distress Cardiovascular: RRR, S1/S2 +, no rubs, no gallops Respiratory: CTA bilaterally, no wheezing, no rhonchi Abdominal: Soft, NT, ND, bowel sounds + Extremities: no edema, no cyanosis, left lower extremity bandaged.    The results of significant diagnostics from this hospitalization (including imaging, microbiology, ancillary and laboratory) are listed below for reference.     Microbiology: Recent Results (from the past 240 hour(s))  Resp Panel by RT-PCR (Flu A&B, Covid) Nasopharyngeal Swab     Status: None   Collection Time:  05/20/21  8:46 PM   Specimen: Nasopharyngeal Swab; Nasopharyngeal(NP) swabs in vial transport medium  Result Value Ref Range Status   SARS Coronavirus 2 by RT PCR NEGATIVE NEGATIVE Final    Comment: (NOTE) SARS-CoV-2 target nucleic acids are NOT DETECTED.  The SARS-CoV-2 RNA is generally detectable in upper respiratory specimens during the acute phase of infection. The lowest concentration of SARS-CoV-2 viral copies this assay can detect is 138 copies/mL. A negative result does not preclude SARS-Cov-2 infection and should not be used as the sole basis for treatment or other patient management decisions. A negative result may occur with  improper specimen collection/handling, submission of specimen other than nasopharyngeal swab, presence of viral mutation(s) within the areas targeted by this assay, and inadequate number of viral copies(<138 copies/mL). A negative result must be combined with clinical observations, patient history, and epidemiological information. The expected result is Negative.  Fact Sheet  for Patients:  EntrepreneurPulse.com.au  Fact Sheet for Healthcare Providers:  IncredibleEmployment.be  This test is no t yet approved or cleared by the [place] FDA and  has been authorized for detection and/or diagnosis of SARS-CoV-2 by FDA under an Emergency Use Authorization (EUA). This EUA will remain  in effect (meaning this test can be used) for the duration of the COVID-19 declaration under Section 564(b)(1) of the Act, 21 U.S.C.section 360bbb-3(b)(1), unless the authorization is terminated  or revoked sooner.       Influenza A by PCR NEGATIVE NEGATIVE Final   Influenza B by PCR NEGATIVE NEGATIVE Final    Comment: (NOTE) The Xpert Xpress SARS-CoV-2/FLU/RSV plus assay is intended as an aid in the diagnosis of influenza from Nasopharyngeal swab specimens and should not be used as a sole basis for treatment. Nasal washings  and aspirates are unacceptable for Xpert Xpress SARS-CoV-2/FLU/RSV testing.  Fact Sheet for Patients: EntrepreneurPulse.com.au  Fact Sheet for Healthcare Providers: IncredibleEmployment.be  This test is not yet approved or cleared by the [place] FDA and has been authorized for detection and/or diagnosis of SARS-CoV-2 by FDA under an Emergency Use Authorization (EUA). This EUA will remain in effect (meaning this test can be used) for the duration of the COVID-19 declaration under Section 564(b)(1) of the Act, 21 U.S.C. section 360bbb-3(b)(1), unless the authorization is terminated or revoked.  Performed at [facility], [address], [place], [place] [address]   Culture, blood (Routine X 2) w Reflex to ID Panel     Status: None   Collection Time: 05/20/21  8:46 PM   Specimen: BLOOD RIGHT FOREARM  Result Value Ref Range Status   Specimen Description BLOOD RIGHT FOREARM  Final   Special Requests   Final    BOTTLES DRAWN AEROBIC AND ANAEROBIC Blood Culture adequate volume   Culture   Final    NO GROWTH 5 DAYS Performed at [facility], [address], [place], [place] [address]    Report Status 05/25/2021 FINAL  Final  Culture, blood (Routine X 2) w Reflex to ID Panel     Status: None   Collection Time: 05/20/21  9:23 PM   Specimen: BLOOD LEFT ARM  Result Value Ref Range Status   Specimen Description BLOOD LEFT ARM  Final   Special Requests   Final    BOTTLES DRAWN AEROBIC AND ANAEROBIC Blood Culture adequate volume   Culture   Final    NO GROWTH 5 DAYS Performed at [facility], [address] [place], [place] [address]    Report Status 05/25/2021 FINAL  Final     Labs: BNP (last 3 results) No results for input(s): BNP in the last 8760 hours. Basic Metabolic Panel: No results for input(s): NA, K, CL, CO2, GLUCOSE, BUN, CREATININE, CALCIUM, MG, PHOS in the last 168 hours. Liver Function Tests: No results for input(s): AST, ALT,  ALKPHOS, BILITOT, PROT, ALBUMIN in the last 168 hours. No results for input(s): LIPASE, AMYLASE in the last 168 hours. No results for input(s): AMMONIA in the last 168 hours. CBC: No results for input(s): WBC, NEUTROABS, HGB, HCT, MCV, PLT in the last 168 hours. Cardiac Enzymes: No results for input(s): CKTOTAL, CKMB, CKMBINDEX, TROPONINI in the last 168 hours. BNP: Invalid input(s): POCBNP CBG: Recent Labs  Lab 05/29/21 1104 05/29/21 1727 05/29/21 2118 05/30/21 0728 05/30/21 1125  GLUCAP 198* 143* 314* 180* 145*   D-Dimer No results for input(s): DDIMER in the last 72 hours. Hgb A1c No results for  input(s): HGBA1C in the last 72 hours. Lipid Profile No results for input(s): CHOL, HDL, LDLCALC, TRIG, CHOLHDL, LDLDIRECT in the last 72 hours. Thyroid function studies No results for input(s): TSH, T4TOTAL, T3FREE, THYROIDAB in the last 72 hours.  Invalid input(s): FREET3 Anemia work up No results for input(s): VITAMINB12, FOLATE, FERRITIN, TIBC, IRON, RETICCTPCT in the last 72 hours. Urinalysis    Component Value Date/Time   COLORURINE AMBER (A) 03/10/2021 1919   APPEARANCEUR CLOUDY (A) 03/10/2021 1919   LABSPEC 1.014 03/10/2021 1919   PHURINE 5.0 03/10/2021 1919   GLUCOSEU NEGATIVE 03/10/2021 1919   HGBUR SMALL (A) 03/10/2021 1919   BILIRUBINUR NEGATIVE 03/10/2021 1919   KETONESUR NEGATIVE 03/10/2021 1919   PROTEINUR 30 (A) 03/10/2021 1919   UROBILINOGEN 0.2 06/23/2013 1750   NITRITE POSITIVE (A) 03/10/2021 1919   LEUKOCYTESUR LARGE (A) 03/10/2021 1919   Sepsis Labs Invalid input(s): PROCALCITONIN,  WBC,  LACTICIDVEN Microbiology Recent Results (from the past 240 hour(s))  Resp Panel by RT-PCR (Flu A&B, Covid) Nasopharyngeal Swab     Status: None   Collection Time: 05/20/21  8:46 PM   Specimen: Nasopharyngeal Swab; Nasopharyngeal(NP) swabs in vial transport medium  Result Value Ref Range Status   SARS Coronavirus 2 by RT PCR NEGATIVE NEGATIVE Final    Comment:  (NOTE) SARS-CoV-2 target nucleic acids are NOT DETECTED.  The SARS-CoV-2 RNA is generally detectable in upper respiratory specimens during the acute phase of infection. The lowest concentration of SARS-CoV-2 viral copies this assay can detect is 138 copies/mL. A negative result does not preclude SARS-Cov-2 infection and should not be used as the sole basis for treatment or other patient management decisions. A negative result may occur with  improper specimen collection/handling, submission of specimen other than nasopharyngeal swab, presence of viral mutation(s) within the areas targeted by this assay, and inadequate number of viral copies(<138 copies/mL). A negative result must be combined with clinical observations, patient history, and epidemiological information. The expected result is Negative.  Fact Sheet for Patients:  EntrepreneurPulse.com.au  Fact Sheet for Healthcare Providers:  IncredibleEmployment.be  This test is no t yet approved or cleared by the [place] FDA and  has been authorized for detection and/or diagnosis of SARS-CoV-2 by FDA under an Emergency Use Authorization (EUA). This EUA will remain  in effect (meaning this test can be used) for the duration of the COVID-19 declaration under Section 564(b)(1) of the Act, 21 U.S.C.section 360bbb-3(b)(1), unless the authorization is terminated  or revoked sooner.       Influenza A by PCR NEGATIVE NEGATIVE Final   Influenza B by PCR NEGATIVE NEGATIVE Final    Comment: (NOTE) The Xpert Xpress SARS-CoV-2/FLU/RSV plus assay is intended as an aid in the diagnosis of influenza from Nasopharyngeal swab specimens and should not be used as a sole basis for treatment. Nasal washings and aspirates are unacceptable for Xpert Xpress SARS-CoV-2/FLU/RSV testing.  Fact Sheet for Patients: EntrepreneurPulse.com.au  Fact Sheet for Healthcare  Providers: IncredibleEmployment.be  This test is not yet approved or cleared by the [place] FDA and has been authorized for detection and/or diagnosis of SARS-CoV-2 by FDA under an Emergency Use Authorization (EUA). This EUA will remain in effect (meaning this test can be used) for the duration of the COVID-19 declaration under Section 564(b)(1) of the Act, 21 U.S.C. section 360bbb-3(b)(1), unless the authorization is terminated or revoked.  Performed at [facility], [address], [place], [place] [address]   Culture, blood (Routine X 2) w Reflex to  ID Panel     Status: None   Collection Time: 05/20/21  8:46 PM   Specimen: BLOOD RIGHT FOREARM  Result Value Ref Range Status   Specimen Description BLOOD RIGHT FOREARM  Final   Special Requests   Final    BOTTLES DRAWN AEROBIC AND ANAEROBIC Blood Culture adequate volume   Culture   Final    NO GROWTH 5 DAYS Performed at [facility], [address], [place], [place] [address]    Report Status 05/25/2021 FINAL  Final  Culture, blood (Routine X 2) w Reflex to ID Panel     Status: None   Collection Time: 05/20/21  9:23 PM   Specimen: BLOOD LEFT ARM  Result Value Ref Range Status   Specimen Description BLOOD LEFT ARM  Final   Special Requests   Final    BOTTLES DRAWN AEROBIC AND ANAEROBIC Blood Culture adequate volume   Culture   Final    NO GROWTH 5 DAYS Performed at [facility], [address] [place], [place] [address]    Report Status 05/25/2021 FINAL  Final     Time coordinating discharge: 35 minutes  SIGNED:   Rodena Goldmann, DO [facility] 05/30/2021, 2:57 PM  If 7PM-7AM, please contact night-coverage [email or web address]

## 2021-05-30 NOTE — Progress Notes (Signed)
Physical Therapy Treatment Patient Details Name: [name] MRN: [id number] DOB: 1961-08-03 Today's Date: 05/30/2021    History of Present Illness [name] is a 60 y.o. female presents to the ED with a chief complaint of left foot swelling. X-ray shows Lisfranc dislocation. Ortho consult note doesn't specify WB status. PMH: diabetes, HLD, HTN, Tobacco abuse,  s/p 2nd toe and partial ray resection in June    PT Comments    Patient presents up in chair (assisted by nursing staff) and agreeable for therapy.  Patient demonstrates fair/good return for completing BLE ROM/strengthening exercises with verbal cueing, very unsteady on feet and limited to few steps at bedside with poor carryover for maintaining NWB LLE due to BLE weakness and fatigue.  Patient tolerated staying up in chair after therapy.  Patient will benefit from continued physical therapy in hospital and recommended venue below to increase strength, balance, endurance for safe ADLs and gait.      Follow Up Recommendations  SNF;Supervision for mobility/OOB;Supervision - Intermittent     Equipment Recommendations  None recommended by PT    Recommendations for Other Services       Precautions / Restrictions Precautions Precautions: Fall Restrictions Weight Bearing Restrictions: Yes LLE Weight Bearing: Non weight bearing    Mobility  Bed Mobility               General bed mobility comments: Patient presents up in chair (assisted by nursing staff)    Transfers Overall transfer level: Needs assistance Equipment used: Rolling walker (2 wheeled) Transfers: Sit to/from Stand Sit to Stand: Min assist;Mod assist         General transfer comment: fair/poor carryover for maintain NWB LLE  Ambulation/Gait Ambulation/Gait assistance: Mod assist Gait Distance (Feet): 3 Feet Assistive device: Rolling walker (2 wheeled) Gait Pattern/deviations: Decreased step length - right;Decreased stride  length;Antalgic Gait velocity: decreased   General Gait Details: limited to 3-4 steps at bedside mostly due to BLE weakness with fair/poor carryover for keeping left foot off floor   Stairs             Wheelchair Mobility    Modified Rankin (Stroke Patients Only)       Balance Overall balance assessment: Needs assistance Sitting-balance support: Feet supported;No upper extremity supported Sitting balance-Leahy Scale: Good Sitting balance - Comments: seated in chair   Standing balance support: During functional activity;Bilateral upper extremity supported Standing balance-Leahy Scale: Poor Standing balance comment: fair/poor using RW while NWB LLE                            Cognition Arousal/Alertness: Awake/alert Behavior During Therapy: WFL for tasks assessed/performed Overall Cognitive Status: No family/caregiver present to determine baseline cognitive functioning                                        Exercises General Exercises - Lower Extremity Long Arc Quad: Seated;AROM;Strengthening;Both;15 reps Hip Flexion/Marching: Seated;AROM;Strengthening;Both;15 reps Toe Raises: Seated;AROM;Strengthening;Both;15 reps Heel Raises: Seated;AROM;Strengthening;Both;15 reps    General Comments        Pertinent Vitals/Pain Pain Assessment: No/denies pain    Home Living                      Prior Function            PT Goals (current goals can now be found  in the care plan section) Acute Rehab PT Goals Patient Stated Goal: return home PT Goal Formulation: With patient Time For Goal Achievement: 06/06/21 Potential to Achieve Goals: Good Progress towards PT goals: Progressing toward goals    Frequency    Min 3X/week      PT Plan Current plan remains appropriate    Co-evaluation              AM-PAC PT "6 Clicks" Mobility   Outcome Measure  Help needed turning from your back to your side while in a flat bed  without using bedrails?: A Little Help needed moving from lying on your back to sitting on the side of a flat bed without using bedrails?: A Little Help needed moving to and from a bed to a chair (including a wheelchair)?: A Little Help needed standing up from a chair using your arms (e.g., wheelchair or bedside chair)?: A Lot Help needed to walk in hospital room?: A Lot Help needed climbing 3-5 steps with a railing? : Total 6 Click Score: 14    End of Session   Activity Tolerance: Patient tolerated treatment well;Patient limited by fatigue Patient left: in chair;with call bell/phone within reach;with chair alarm set Nurse Communication: Mobility status PT Visit Diagnosis: Unsteadiness on feet (R26.81);Other abnormalities of gait and mobility (R26.89);Muscle weakness (generalized) (M62.81);Difficulty in walking, not elsewhere classified (R26.2)     Time: 2585-2778 PT Time Calculation (min) (ACUTE ONLY): 20 min  Charges:  $Therapeutic Exercise: 8-22 mins $Therapeutic Activity: 8-22 mins                     12:40 PM, 05/30/21 Ocie Bob, MPT Physical Therapist with [facility] 336 [phone] office [phone] mobile phone

## 2021-06-07 ENCOUNTER — Encounter: Payer: Medicaid Other | Admitting: Podiatry

## 2021-07-07 ENCOUNTER — Other Ambulatory Visit: Payer: Self-pay

## 2021-07-07 ENCOUNTER — Ambulatory Visit (INDEPENDENT_AMBULATORY_CARE_PROVIDER_SITE_OTHER): Payer: Medicaid Other | Admitting: Podiatry

## 2021-07-07 ENCOUNTER — Ambulatory Visit (INDEPENDENT_AMBULATORY_CARE_PROVIDER_SITE_OTHER): Payer: Medicaid Other

## 2021-07-07 DIAGNOSIS — M14672 Charcot's joint, left ankle and foot: Secondary | ICD-10-CM | POA: Diagnosis not present

## 2021-07-07 NOTE — Progress Notes (Signed)
Subjective:  Patient ID: [name], female    DOB: 11-18-1960,  MRN: [id number]  Chief Complaint  Patient presents with   Routine Post Op    Left foot     60 y.o. female presents with the above complaint.  Patient presents with a new complaint of Charcot deformity of the Lisfranc joint.  Patient states that this happened over the last few weeks and has progressed to gotten worse.  She states that she was at a nursing care facility where she is nonweightbearing because of the Charcot foot.  She states when it first happened it was red hot swollen and started noticing bulging of the bone.  She denies any other acute complaint she is a diabetic uncontrolled.  She has healed really well from the amputation that was done by me.  She denies any other acute complaints.   Review of Systems: Negative except as noted in the HPI. Denies N/V/F/Ch.  Past Medical History:  Diagnosis Date   Diabetes mellitus without complication ([place])    Hidradenitis 12/13/2015   Hypercholesterolemia    Hypertension    Smoker 12/13/2015    Current Outpatient Medications:    ACCU-CHEK GUIDE test strip, USE TO TEST 3 TIMESDDAILY., Disp: , Rfl:    Accu-Chek Softclix Lancets lancets, 3 (three) times daily., Disp: , Rfl:    acetaminophen (TYLENOL) 325 MG tablet, Take 650 mg by mouth every 6 (six) hours as needed for mild pain., Disp: , Rfl:    ascorbic acid (VITAMIN C) 500 MG tablet, Take 1 tablet (500 mg total) by mouth daily., Disp: 30 tablet, Rfl: 0   blood glucose meter kit and supplies, Dispense based on patient and insurance preference. Use up to four times daily as directed. (FOR ICD-10 E10.9, E11.9)., Disp: 1 each, Rfl: 0   cholecalciferol (VITAMIN D3) 25 MCG (1000 UNIT) tablet, Take 1,000 Units by mouth daily., Disp: , Rfl:    gabapentin (NEURONTIN) 100 MG capsule, Take 1 capsule (100 mg total) by mouth 3 (three) times daily., Disp: 90 capsule, Rfl: 3   glipiZIDE (GLUCOTROL) 5 MG tablet, Take 5 mg by mouth  2 (two) times daily. (Patient not taking: No sig reported), Disp: , Rfl:    insulin glargine (LANTUS) 100 UNIT/ML Solostar Pen, Inject 10 Units into the skin at bedtime., Disp: 15 mL, Rfl: 1   Insulin Pen Needle 31G X 5 MM MISC, 1 Device by Does not apply route as directed., Disp: 100 each, Rfl: 1   lisinopril-hydrochlorothiazide (PRINZIDE,ZESTORETIC) 10-12.5 MG tablet, Take 1 tablet by mouth daily., Disp: , Rfl:    metFORMIN (GLUCOPHAGE) 850 MG tablet, Take 850 mg by mouth 2 (two) times daily., Disp: , Rfl:    Nutritional Supplements (,FEEDING SUPPLEMENT, PROSOURCE PLUS) liquid, Take 30 mLs by mouth 2 (two) times daily between meals., Disp: 887 mL, Rfl: 3   oxybutynin (DITROPAN-XL) 10 MG 24 hr tablet, Take 1 tablet (10 mg total) by mouth at bedtime., Disp: 30 tablet, Rfl: 3   oxyCODONE (OXY IR/ROXICODONE) 5 MG immediate release tablet, Take 1 tablet (5 mg total) by mouth every 4 (four) hours as needed for moderate pain., Disp: 10 tablet, Rfl: 0   PROAIR HFA 108 (90 Base) MCG/ACT inhaler, Inhale 2 puffs into the lungs every 4 (four) hours as needed for wheezing., Disp: , Rfl:    QUEtiapine (SEROQUEL) 200 MG tablet, Take 1 tablet (200 mg total) by mouth at bedtime., Disp: 30 tablet, Rfl: 0  Social History   Tobacco Use  Smoking Status Every Day   Packs/day: 0.50   Years: 34.00   Pack years: 17.00   Types: Cigarettes  Smokeless Tobacco Never    No Known Allergies Objective:  There were no vitals filed for this visit. There is no height or weight on file to calculate BMI. Constitutional Well developed. Well nourished.  Vascular Dorsalis pedis pulses palpable bilaterally. Posterior tibial pulses palpable bilaterally. Capillary refill normal to all digits.  No cyanosis or clubbing noted. Pedal hair growth normal.  Neurologic Normal speech. Oriented to person, place, and time. Epicritic sensation to light touch grossly present bilaterally.  Dermatologic Nails well groomed and normal in  appearance. No open wounds. No skin lesions.  Orthopedic: No ulceration or lesion noted.  Charcot deformity/changes of the foot noted with bulging of the medial first tarsometatarsal joint.  No excessive stress noted to the skin.   Radiographs: 3 views of skeletally mature adult left foot: Assessment:   1. Charcot's joint of foot, left    Plan:  Patient was evaluated and treated and all questions answered.  Left Charcot foot deformity in consolidating face -I explained to the patient the etiology of Charcot deformity and various treatment options were discussed.  At this time given that patient does not have any known history of injury erosion Charcot changes to the midfoot.  I discussed with the patient the importance of nonweightbearing.  Given that this is already in consolidating phase for past few months I believe patient will benefit from a Crow walker to ambulate.  I discussed this with patient and patient agrees with the plan would like to obtain [facility] walker -A prescription was given for [place] [facility] to obtain a [employer]. -She will continue to maintain nonweightbearing status with a wheelchair when ambulating on the foot however for transferring she will place most of her weight to the heel. -I will see her back again in 3 months for further management of Charcot deformity.  No follow-ups on file.

## 2021-07-10 ENCOUNTER — Telehealth: Payer: Self-pay | Admitting: *Deleted

## 2021-07-10 NOTE — Telephone Encounter (Signed)
Patient's son is calling to request a refill of pain medicine(oxyCodone -5 -325 mg), is out of nursing home now. Does she continue to take after discharge?Please advise.

## 2021-07-11 MED ORDER — OXYCODONE-ACETAMINOPHEN 5-325 MG PO TABS: 1.0000 | ORAL_TABLET | ORAL | 0 refills | Status: DC | PRN

## 2021-07-11 NOTE — Telephone Encounter (Signed)
Called patient and spoke with son giving information per physician, said that his mother is still in a lot of pain and will need something. She has not taken any pain medicines since being discharged.

## 2021-07-21 ENCOUNTER — Other Ambulatory Visit: Payer: Self-pay | Admitting: Podiatry

## 2021-07-24 NOTE — Telephone Encounter (Signed)
Patient's son is calling for a pain medicine refill. Wanted to let the doctor know that she is not getting her boot for another 3 weeks and appointment is 2 mos.out.Please advise.

## 2021-07-24 NOTE — Telephone Encounter (Signed)
Please advise 

## 2021-10-18 ENCOUNTER — Ambulatory Visit (INDEPENDENT_AMBULATORY_CARE_PROVIDER_SITE_OTHER): Payer: Medicaid Other

## 2021-10-18 ENCOUNTER — Other Ambulatory Visit: Payer: Self-pay

## 2021-10-18 ENCOUNTER — Ambulatory Visit (INDEPENDENT_AMBULATORY_CARE_PROVIDER_SITE_OTHER): Payer: Medicaid Other | Admitting: Podiatry

## 2021-10-18 DIAGNOSIS — M14672 Charcot's joint, left ankle and foot: Secondary | ICD-10-CM | POA: Diagnosis not present

## 2021-10-19 ENCOUNTER — Encounter: Payer: Self-pay | Admitting: Podiatry

## 2021-10-19 NOTE — Progress Notes (Signed)
Subjective:  Patient ID: [name], female    DOB: December 10, 1960,  MRN: [id number]  Chief Complaint  Patient presents with   Follow-up    Debridement foot amputation 2nd toe     61 y.o. female presents with the above complaint.  Patient presents with a follow-up of Charcot deformity of the midfoot joints.  Patient states that she is doing a lot better her pain has gone down considerably.  She states that she has been walking around with the [facility] walker she has gotten used to it.  She denies any other acute complaints.  She is here for checkup of Charcot   Review of Systems: Negative except as noted in the HPI. Denies N/V/F/Ch.  Past Medical History:  Diagnosis Date   Diabetes mellitus without complication ([place])    Hidradenitis 12/13/2015   Hypercholesterolemia    Hypertension    Smoker 12/13/2015    Current Outpatient Medications:    ACCU-CHEK GUIDE test strip, USE TO TEST 3 TIMESDDAILY., Disp: , Rfl:    Accu-Chek Softclix Lancets lancets, 3 (three) times daily., Disp: , Rfl:    acetaminophen (TYLENOL) 325 MG tablet, Take 650 mg by mouth every 6 (six) hours as needed for mild pain., Disp: , Rfl:    ascorbic acid (VITAMIN C) 500 MG tablet, Take 1 tablet (500 mg total) by mouth daily., Disp: 30 tablet, Rfl: 0   blood glucose meter kit and supplies, Dispense based on patient and insurance preference. Use up to four times daily as directed. (FOR ICD-10 E10.9, E11.9)., Disp: 1 each, Rfl: 0   cholecalciferol (VITAMIN D3) 25 MCG (1000 UNIT) tablet, Take 1,000 Units by mouth daily., Disp: , Rfl:    gabapentin (NEURONTIN) 100 MG capsule, Take 1 capsule (100 mg total) by mouth 3 (three) times daily., Disp: 90 capsule, Rfl: 3   glipiZIDE (GLUCOTROL) 5 MG tablet, Take 5 mg by mouth 2 (two) times daily. (Patient not taking: No sig reported), Disp: , Rfl:    insulin glargine (LANTUS) 100 UNIT/ML Solostar Pen, Inject 10 Units into the skin at bedtime., Disp: 15 mL, Rfl: 1   Insulin Pen Needle  31G X 5 MM MISC, 1 Device by Does not apply route as directed., Disp: 100 each, Rfl: 1   lisinopril-hydrochlorothiazide (PRINZIDE,ZESTORETIC) 10-12.5 MG tablet, Take 1 tablet by mouth daily., Disp: , Rfl:    metFORMIN (GLUCOPHAGE) 850 MG tablet, Take 850 mg by mouth 2 (two) times daily., Disp: , Rfl:    Nutritional Supplements (,FEEDING SUPPLEMENT, PROSOURCE PLUS) liquid, Take 30 mLs by mouth 2 (two) times daily between meals., Disp: 887 mL, Rfl: 3   oxybutynin (DITROPAN-XL) 10 MG 24 hr tablet, Take 1 tablet (10 mg total) by mouth at bedtime., Disp: 30 tablet, Rfl: 3   oxyCODONE (OXY IR/ROXICODONE) 5 MG immediate release tablet, Take 1 tablet (5 mg total) by mouth every 4 (four) hours as needed for moderate pain., Disp: 10 tablet, Rfl: 0   oxyCODONE-acetaminophen (PERCOCET/ROXICET) 5-325 MG tablet, TAKE 1 TABLET BY MOUTH EVERY 4 HOURS AS NEEDED FOR SEVERE PAIN., Disp: 30 tablet, Rfl: 0   PROAIR HFA 108 (90 Base) MCG/ACT inhaler, Inhale 2 puffs into the lungs every 4 (four) hours as needed for wheezing., Disp: , Rfl:    QUEtiapine (SEROQUEL) 200 MG tablet, Take 1 tablet (200 mg total) by mouth at bedtime., Disp: 30 tablet, Rfl: 0  Social History   Tobacco Use  Smoking Status Every Day   Packs/day: 0.50   Years: 34.00  Pack years: 17.00   Types: Cigarettes  Smokeless Tobacco Never    No Known Allergies Objective:  There were no vitals filed for this visit. There is no height or weight on file to calculate BMI. Constitutional Well developed. Well nourished.  Vascular Dorsalis pedis pulses palpable bilaterally. Posterior tibial pulses palpable bilaterally. Capillary refill normal to all digits.  No cyanosis or clubbing noted. Pedal hair growth normal.  Neurologic Normal speech. Oriented to person, place, and time. Epicritic sensation to light touch grossly present bilaterally.  Dermatologic Nails well groomed and normal in appearance. No open wounds. No skin lesions.  Orthopedic:  No ulceration or lesion noted.  Charcot deformity/changes of the foot noted with bulging of the medial first tarsometatarsal joint.  No excessive stress noted to the skin.   Radiographs: 3 views of skeletally mature adult left foot: Consolidation noted of the bones.  No prominence noted.  Cuboid height looks within normal limits.  No plantar pressure noted. Assessment:   1. Charcot's joint of foot, left    Plan:  Patient was evaluated and treated and all questions answered.  Left Charcot foot deformity in consolidating phase -I explained to the patient the etiology of Charcot deformity and various treatment options were discussed.   -Continue using Crow walker and can slowly begin to transition to regular shoes with an insole.  At this time the bones have consolidated and she is no longer in the acute phase of Charcot.  However I did discuss with her if something happens and does not feel right to put her self back in the [place] walker.  She states understanding. -I will see her back again in 6 months for further management of Charcot deformity.  No follow-ups on file.

## 2022-02-05 ENCOUNTER — Ambulatory Visit (INDEPENDENT_AMBULATORY_CARE_PROVIDER_SITE_OTHER): Payer: Medicaid Other | Admitting: Adult Health

## 2022-02-05 ENCOUNTER — Encounter: Payer: Self-pay | Admitting: Adult Health

## 2022-02-05 VITALS — BP 144/78 | HR 92 | Ht 64.25 in | Wt 148.0 lb

## 2022-02-05 DIAGNOSIS — Z01419 Encounter for gynecological examination (general) (routine) without abnormal findings: Secondary | ICD-10-CM | POA: Insufficient documentation

## 2022-02-05 DIAGNOSIS — N3946 Mixed incontinence: Secondary | ICD-10-CM

## 2022-02-05 DIAGNOSIS — Z1211 Encounter for screening for malignant neoplasm of colon: Secondary | ICD-10-CM | POA: Diagnosis not present

## 2022-02-05 DIAGNOSIS — F17200 Nicotine dependence, unspecified, uncomplicated: Secondary | ICD-10-CM | POA: Diagnosis not present

## 2022-02-05 LAB — HEMOCCULT GUIAC POC 1CARD (OFFICE): Fecal Occult Blood, POC: NEGATIVE

## 2022-02-05 MED ORDER — OXYBUTYNIN CHLORIDE ER 10 MG PO TB24: 10.0000 mg | ORAL_TABLET | Freq: Every day | ORAL | 12 refills | Status: DC

## 2022-02-05 NOTE — Progress Notes (Signed)
Patient ID: [name], female   DOB: [birth year], 61 y.o.   MRN: [id number] ?History of Present Illness: ?[name] is a 61 year old white female,divorced, PM in for well woman gyn exam. ?Lab Results  ?Component Value Date  ? DIAGPAP  02/02/2021  ?  - Negative for intraepithelial lesion or malignancy (NILM)  ? HPVHIGH Negative 02/02/2021  ?  ?PCP is Dr Ouida Sills. ? ?Current Medications, Allergies, Past Medical History, Past Surgical History, Family History and Social History were reviewed in [employer] record.   ? ? ?Review of Systems: ?Patient denies any headaches, hearing loss, fatigue, blurred vision, shortness of breath, chest pain, abdominal pain, problems with bowel movements, or intercourse(not having sex). No joint pain or mood swings.  ?Has mixed UI, meds help some, needs refill. ? ? ? ?Physical Exam:BP (!) 144/78 (BP Location: Right Arm, Patient Position: Sitting, Cuff Size: Normal)   Pulse 92   Ht 5' 4.25" (1.632 m)   Wt 148 lb (67.1 kg)   BMI 25.21 kg/m?   ?General:  Well developed, well nourished, no acute distress ?Skin:  Warm and dry ?Neck:  Midline trachea, normal thyroid, good ROM, no lymphadenopathy,no carotid bruits heard ?Lungs; has some expiratory wheezing ?Breast:  No dominant palpable mass, retraction, or nipple discharge ?Cardiovascular: Regular rate and rhythm ?Abdomen:  Soft, non tender, no hepatosplenomegaly ?Pelvic:  External genitalia is normal in appearance, no lesions.  The vagina is pale with loss of rugae. Urethra has no lesions or masses. The cervix is smooth.  Uterus is felt to be normal size, shape, and contour.  No adnexal masses or tenderness noted.Bladder is non tender, no masses felt. ?Rectal: Good sphincter tone, no polyps, or hemorrhoids felt.  Hemoccult negative.+rectocele ?Extremities/musculoskeletal:  No swelling or varicosities noted, no clubbing or cyanosis, she has walking boot on left foot. Her nails stained from nicotine  ?Psych:  No mood  changes, alert and cooperative,seems happy ?AA is 0 ?Fall risk is low ? [place] - 02/05/22 1038   ? ?  ? Pregnancy Intention Screening  ? Does the patient want to become pregnant in the next year? N/A   ? Does the patient's partner want to become pregnant in the next year? N/A   ? Would the patient like to discuss contraceptive options today? N/A   ?  ? Contraception Wrap Up  ? Current Method Abstinence   PM  ? End Method Abstinence   PM  ? Contraception Counseling Provided No   ? ?  ?  ? ?  ?  ? ?  02/05/2022  ? 10:27 AM 02/02/2021  ? 10:36 AM 01/16/2018  ?  3:12 PM  ?Depression screen PHQ 2/9  ?Decreased Interest 0 0 0  ?Down, Depressed, Hopeless 0 0 0  ?PHQ - 2 Score 0 0 0  ?Altered sleeping  3 0  ?Tired, decreased energy  0 0  ?Change in appetite  0 0  ?Feeling bad or failure about yourself   0 0  ?Trouble concentrating  0 0  ?Moving slowly or fidgety/restless  0 0  ?Suicidal thoughts  0 0  ?PHQ-9 Score  3 0  ?  ? ?  02/05/2022  ? 10:27 AM 02/02/2021  ? 10:36 AM  ?GAD 7 : Generalized Anxiety Score  ?Nervous, Anxious, on Edge 0 0  ?Control/stop worrying 0 0  ?Worry too much - different things 0 0  ?Trouble relaxing 0 0  ?Restless 0 0  ?Easily annoyed or irritable 0  0  ?Afraid - awful might happen 0 0  ?Total GAD 7 Score 0 0  ? ? Examination chaperoned by Faith Rogue LPN ? ?Impression and plan: ?1. Encounter for annual routine gynecological examination ?Physical in 1 year ?Pap 2025 ?Labs with PCP ?Mammogram yearly ? ? ?2. Encounter for screening fecal occult blood testing ?Hemoccult negative  ? 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 oxybutynin (DITROPAN-XL) 10 MG 24 hr tablet  ?  Sig: Take 1 tablet (10 mg total) by mouth at bedtime.  ?  Dispense:  30 tablet  ?  Refill:  12  ?  Order Specific Question:   Supervising Provider  ?  Answer:   Duane Lope H [[id number]]  ?  ? ?4. Smoker ?She says she is not ready to quit ? ? ? ? ?  ?  ?

## 2022-02-06 ENCOUNTER — Emergency Department (HOSPITAL_COMMUNITY)
Admission: EM | Admit: 2022-02-06 | Discharge: 2022-02-06 | Disposition: A | Discharge status: Home / Self Care | Payer: Medicaid Other | Attending: Emergency Medicine | Admitting: Emergency Medicine

## 2022-02-06 ENCOUNTER — Emergency Department (HOSPITAL_COMMUNITY): Payer: Medicaid Other

## 2022-02-06 ENCOUNTER — Encounter (HOSPITAL_COMMUNITY): Payer: Self-pay | Admitting: Emergency Medicine

## 2022-02-06 ENCOUNTER — Other Ambulatory Visit: Payer: Self-pay

## 2022-02-06 DIAGNOSIS — Z794 Long term (current) use of insulin: Secondary | ICD-10-CM | POA: Insufficient documentation

## 2022-02-06 DIAGNOSIS — E119 Type 2 diabetes mellitus without complications: Secondary | ICD-10-CM | POA: Diagnosis not present

## 2022-02-06 DIAGNOSIS — Z79899 Other long term (current) drug therapy: Secondary | ICD-10-CM | POA: Insufficient documentation

## 2022-02-06 DIAGNOSIS — Z7984 Long term (current) use of oral hypoglycemic drugs: Secondary | ICD-10-CM | POA: Insufficient documentation

## 2022-02-06 DIAGNOSIS — M79674 Pain in right toe(s): Secondary | ICD-10-CM | POA: Diagnosis present

## 2022-02-06 DIAGNOSIS — I10 Essential (primary) hypertension: Secondary | ICD-10-CM | POA: Insufficient documentation

## 2022-02-06 DIAGNOSIS — M8660 Other chronic osteomyelitis, unspecified site: Secondary | ICD-10-CM | POA: Insufficient documentation

## 2022-02-06 DIAGNOSIS — F17200 Nicotine dependence, unspecified, uncomplicated: Secondary | ICD-10-CM | POA: Insufficient documentation

## 2022-02-06 LAB — CBC WITH DIFFERENTIAL/PLATELET
Abs Immature Granulocytes: 0.04 10*3/uL (ref 0.00–0.07)
Basophils Absolute: 0 10*3/uL (ref 0.0–0.1)
Basophils Relative: 0 %
Eosinophils Absolute: 0.1 10*3/uL (ref 0.0–0.5)
Eosinophils Relative: 1 %
HCT: 40 % (ref 36.0–46.0)
Hemoglobin: 14.3 g/dL (ref 12.0–15.0)
Immature Granulocytes: 0 %
Lymphocytes Relative: 24 %
Lymphs Abs: 2.5 10*3/uL (ref 0.7–4.0)
MCH: 31.8 pg (ref 26.0–34.0)
MCHC: 35.8 g/dL (ref 30.0–36.0)
MCV: 89.1 fL (ref 80.0–100.0)
Monocytes Absolute: 0.7 10*3/uL (ref 0.1–1.0)
Monocytes Relative: 7 %
Neutro Abs: 7.1 10*3/uL (ref 1.7–7.7)
Neutrophils Relative %: 68 %
Platelets: 266 10*3/uL (ref 150–400)
RBC: 4.49 MIL/uL (ref 3.87–5.11)
RDW: 12 % (ref 11.5–15.5)
WBC: 10.5 10*3/uL (ref 4.0–10.5)
nRBC: 0 % (ref 0.0–0.2)

## 2022-02-06 LAB — BASIC METABOLIC PANEL
Anion gap: 11 (ref 5–15)
BUN: 15 mg/dL (ref 6–20)
CO2: 25 mmol/L (ref 22–32)
Calcium: 9.4 mg/dL (ref 8.9–10.3)
Chloride: 92 mmol/L — ABNORMAL LOW (ref 98–111)
Creatinine, Ser: 0.73 mg/dL (ref 0.44–1.00)
GFR, Estimated: >60 mL/min (ref >60)
Glucose, Bld: 128 mg/dL — ABNORMAL HIGH (ref 70–99)
Potassium: 3.7 mmol/L (ref 3.5–5.1)
Sodium: 128 mmol/L — ABNORMAL LOW (ref 135–145)

## 2022-02-06 LAB — LACTIC ACID, PLASMA
Lactic Acid, Venous: 1.2 mmol/L (ref 0.5–1.9)
Lactic Acid, Venous: 1.2 mmol/L (ref 0.5–1.9)

## 2022-02-06 MED ORDER — AMOXICILLIN-POT CLAVULANATE 875-125 MG PO TABS: 1.0000 | ORAL_TABLET | Freq: Once | ORAL | Status: DC

## 2022-02-06 MED ORDER — OXYCODONE-ACETAMINOPHEN 5-325 MG PO TABS: 1.0000 | ORAL_TABLET | Freq: Three times a day (TID) | ORAL | 0 refills | Status: AC | PRN

## 2022-02-06 MED ORDER — AMOXICILLIN-POT CLAVULANATE 875-125 MG PO TABS: 1.0000 | ORAL_TABLET | Freq: Two times a day (BID) | ORAL | 0 refills | Status: AC

## 2022-02-06 NOTE — ED Provider Notes (Signed)
?[facility] ?Provider Note ? ? ?CSN: [id number] ?Arrival date & time: 02/06/22  1355 ? ?  ? ?History ? ?Chief Complaint  ?Patient presents with  ? Foot Orthotics  ? ? ?[name] is a 61 y.o. female. ? ?HPI ? ?Patient with medical history including diabetes, hypertension, current smoker presents with complaints of right toe pain.  Patient dates that she noticed that her right toe has been bothering for last couple days and she wanted come in for to be evaluated.  She states that she caught her toe sometime ago she cannot remember, she states that she is not having problems with that then but over the last day she is having more issues with it.  She states that she has not looked at her toe until 2 days ago, she is unsure of how long her toe has been looking the way it has been.  She denies any foot pain or swelling denies any fevers chills chest pain shortness of breath general body aches.  She has no other complaints at this time. ? ?I have reviewed patient's chart patient is under the care of Dr. Posey Pronto she is currently being treated for short charcot of the left midfoot, uses a Crow walker. ? ? ?Home Medications ?Prior to Admission medications   ?Medication Sig Start Date End Date Taking? Authorizing Provider  ?amoxicillin-clavulanate (AUGMENTIN) 875-125 MG tablet Take 1 tablet by mouth every 12 (twelve) hours for 7 days. 02/06/22 02/13/22 Yes Marcello Fennel, PA-C  ?oxyCODONE-acetaminophen (PERCOCET/ROXICET) 5-325 MG tablet Take 1 tablet by mouth every 8 (eight) hours as needed for up to 3 days for severe pain. 02/06/22 02/09/22 Yes Marcello Fennel, PA-C  ?ACCU-CHEK GUIDE test strip USE TO TEST 3 TIMESDDAILY. 03/30/20   [provider]  ?Accu-Chek Softclix Lancets lancets 3 (three) times daily. 03/30/20   [provider]  ?acetaminophen (TYLENOL) 325 MG tablet Take 650 mg by mouth every 6 (six) hours as needed for mild pain.    [provider]  ?ascorbic acid  (VITAMIN C) 500 MG tablet Take 1 tablet (500 mg total) by mouth daily. 03/12/21   Patrecia Pour, MD  ?blood glucose meter kit and supplies Dispense based on patient and insurance preference. Use up to four times daily as directed. (FOR ICD-10 E10.9, E11.9). 03/28/20   Johnson, Clanford L, MD  ?cholecalciferol (VITAMIN D3) 25 MCG (1000 UNIT) tablet Take 1,000 Units by mouth daily.    [provider]  ?gabapentin (NEURONTIN) 100 MG capsule Take 1 capsule (100 mg total) by mouth 3 (three) times daily. 04/22/20   Felipa Furnace, DPM  ?glipiZIDE (GLUCOTROL) 5 MG tablet Take 5 mg by mouth 2 (two) times daily. 12/06/20   [provider]  ?insulin glargine (LANTUS) 100 UNIT/ML Solostar Pen Inject 10 Units into the skin at bedtime. 03/28/20   Johnson, Clanford L, MD  ?Insulin Pen Needle 31G X 5 MM MISC 1 Device by Does not apply route as directed. 03/28/20   Johnson, Clanford L, MD  ?lisinopril-hydrochlorothiazide (PRINZIDE,ZESTORETIC) 10-12.5 MG tablet Take 1 tablet by mouth daily.    [provider]  ?metFORMIN (GLUCOPHAGE) 850 MG tablet Take 850 mg by mouth 2 (two) times daily. 01/25/21   [provider]  ?Nutritional Supplements (,FEEDING SUPPLEMENT, PROSOURCE PLUS) liquid Take 30 mLs by mouth 2 (two) times daily between meals. 05/31/21   Manuella Ghazi, Pratik D, DO  ?oxybutynin (DITROPAN-XL) 10 MG 24 hr tablet Take 1 tablet (10 mg total)  by mouth at bedtime. 02/05/22   Estill Dooms, NP  ?oxyCODONE (OXY IR/ROXICODONE) 5 MG immediate release tablet Take 1 tablet (5 mg total) by mouth every 4 (four) hours as needed for moderate pain. ?Patient not taking: Reported on 02/05/2022 05/30/21   Heath Lark D, DO  ?PROAIR HFA 108 [phone] Base) MCG/ACT inhaler Inhale 2 puffs into the lungs every 4 (four) hours as needed for wheezing. 12/06/20   [provider]  ?QUEtiapine (SEROQUEL) 200 MG tablet Take 1 tablet (200 mg total) by mouth at bedtime. 05/30/21 06/29/21  Heath Lark D, DO  ?   ? ?Allergies     ?Patient has no known allergies.   ? ?Review of Systems   ?Review of Systems  ?Constitutional:  Negative for chills and fever.  ?Respiratory:  Negative for shortness of breath.   ?Cardiovascular:  Negative for chest pain.  ?Gastrointestinal:  Negative for abdominal pain.  ?Musculoskeletal:   ?     Foot pain  ?Neurological:  Negative for headaches.  ? ?Physical Exam ?Updated Vital Signs ?BP (!) 145/78 (BP Location: Right Arm)   Pulse (!) 107   Temp 98.3 ?F (36.8 ?C) (Oral)   Resp 20   Ht _0  (1.626 m)   Wt 68 kg   SpO2 95%   BMI 25.75 kg/m?  ?Physical Exam ?Vitals and nursing note reviewed.  ?Constitutional:   ?   General: She is not in acute distress. ?   Appearance: She is not ill-appearing.  ?HENT:  ?   Head: Normocephalic and atraumatic.  ?   Nose: No congestion.  ?Eyes:  ?   Conjunctiva/sclera: Conjunctivae normal.  ?Cardiovascular:  ?   Rate and Rhythm: Normal rate and regular rhythm.  ?   Pulses: Normal pulses.  ?   Heart sounds: No murmur heard. ?  No friction rub. No gallop.  ?Pulmonary:  ?   Effort: No respiratory distress.  ?   Breath sounds: No wheezing, rhonchi or rales.  ?Musculoskeletal:  ?   Comments: Patient's right foot was visualized she has chronic looking skin changes seen on the third digit, just slight erythema at the base of the MTP joints, and a slightly open wound on the distal medial aspect of the third phalange without bone exposure. she has noted scaling of the without active drainage or discharge please see picture for full detail  ?Skin: ?   General: Skin is warm and dry.  ?Neurological:  ?   Mental Status: She is alert.  ?Psychiatric:     ?   Mood and Affect: Mood normal.  ? ? ? ? ? ? ? ?ED Results / Procedures / Treatments   ?Labs ?(all labs ordered are listed, but only abnormal results are displayed) ?Labs Reviewed  ?BASIC METABOLIC PANEL - Abnormal; Notable for the following components:  ?    Result Value  ? Sodium 128 (*)   ? Chloride 92 (*)   ? Glucose, Bld 128 (*)   ?  All other components within normal limits  ?LACTIC ACID, PLASMA  ?CBC WITH DIFFERENTIAL/PLATELET  ?LACTIC ACID, PLASMA  ? ? ?EKG ?None ? ?Radiology ?DG Foot Complete Right ? ?Result Date: 02/06/2022 ?CLINICAL DATA:  Pain EXAM: RIGHT FOOT COMPLETE - 3+ VIEW COMPARISON:  None Available. FINDINGS: There is deformity with possible lytic lesion in the distal phalanx of middle toe. Possible erosion is seen in the distal portion of middle phalanx. There is deformity in the distal portion of distal phalanx of big  toe without break in the cortical margins. Rest of the visualized bony structures are unremarkable. IMPRESSION: Deformity is seen in the middle and distal phalanges of right third toe suggesting possible osteomyelitis. Deformity without break in the cortical margins in the distal portion of distal phalanx of right big toe may be residual from previous injury. Electronically Signed   By: Elmer Picker M.D.   On: 02/06/2022 16:00   ? ?Procedures ?Procedures  ? ? ?Medications Ordered in ED ?Medications  ?amoxicillin-clavulanate (AUGMENTIN) 875-125 MG per tablet 1 tablet (has no administration in time range)  ? ? ?ED Course/ Medical Decision Making/ A&P ?  ?                        ?Medical Decision Making ?Amount and/or Complexity of Data Reviewed ?Labs: ordered. ?Radiology: ordered. ? ? ?This patient presents to the ED for concern of toe pain, this involves an extensive number of treatment options, and is a complaint that carries with it a high risk of complications and morbidity.  The differential diagnosis includes fracture, dislocation, osteomyelitis ? ? ? ?Additional history obtained: ? ?Additional history obtained from N/A ?External records from outside source obtained and reviewed including previous foot and ankle notes ? ? ?Co morbidities that complicate the patient evaluation ? ?Diabetes ? ?Social Determinants of Health: ? ?N/A ? ? ? ?Lab Tests: ? ?I Ordered, and personally interpreted labs.  The pertinent  results include: CBC unremarkable, BMP shows sodium 128, chloride 92, glucose 128, lactic 1.2 ? ? ?Imaging Studies ordered: ? ?I ordered imaging studies including x-ray of right foot ?I independently visualized and

## 2022-02-06 NOTE — ED Triage Notes (Signed)
Pt has c/o of right foot pain after cutting own toe nails, pt is diabetic, has cam boot on left foot. ?

## 2022-02-06 NOTE — Progress Notes (Signed)
Patient was discussed with PA at [facility] emergency room states that he got an x-ray of her right foot that shows some localized osteomyelitis of right 4th toe however, at this time she does not display any acute leukocytosis or constitutional symptoms and is hoping to have her follow up outpatient. I reviewed pictures in her chart and advised him to go ahead and put patient on Augmentin, dispense a surgical shoe, and daily dressings  of the muporicin until patient can be seen. Office will contact patient to make a follow up appointment this week. If her toe fails to heal or worsen may require outpatient surgical amputation. ?Dr. Cannon Kettle

## 2022-02-06 NOTE — ED Notes (Signed)
Dressing applied to right foot, applied post op shoe, ?

## 2022-02-06 NOTE — Discharge Instructions (Addendum)
Imaging shows you have osteomyelitis of your right third toe, I have placed gauze over the area and placed in a postop shoe please wear during the day may take off at nighttime.  I have started on antibiotics please take as prescribed. ? ?I have given you a short course of narcotics please take as prescribed.  This medication can make you drowsy do not consume alcohol or operate heavy machinery when taking this medication.  This medication is Tylenol in it do not take Tylenol and take this medication.   ? ? ?Please follow-up with Dr. Allena Katz for further evaluation. ? ?Come back to the emergency department if you develop chest pain, shortness of breath, severe abdominal pain, uncontrolled nausea, vomiting, diarrhea. ? ?

## 2022-02-07 ENCOUNTER — Telehealth: Payer: Self-pay

## 2022-02-07 NOTE — Telephone Encounter (Signed)
No answer , vm is full  tried to reach patient to get her in this week  for appointment. See Below

## 2022-02-15 ENCOUNTER — Ambulatory Visit: Payer: Medicaid Other | Admitting: Sports Medicine

## 2022-02-20 ENCOUNTER — Ambulatory Visit (INDEPENDENT_AMBULATORY_CARE_PROVIDER_SITE_OTHER): Payer: Medicaid Other | Admitting: Podiatry

## 2022-02-20 DIAGNOSIS — M869 Osteomyelitis, unspecified: Secondary | ICD-10-CM | POA: Diagnosis not present

## 2022-02-20 DIAGNOSIS — L97514 Non-pressure chronic ulcer of other part of right foot with necrosis of bone: Secondary | ICD-10-CM

## 2022-02-20 MED ORDER — MUPIROCIN 2 % EX OINT: 1.0000 "application " | TOPICAL_OINTMENT | Freq: Every day | CUTANEOUS | 2 refills | Status: DC

## 2022-02-20 MED ORDER — DOXYCYCLINE HYCLATE 100 MG PO TABS: 100.0000 mg | ORAL_TABLET | Freq: Two times a day (BID) | ORAL | 0 refills | Status: DC

## 2022-02-20 NOTE — Patient Instructions (Signed)
Call [phone] to schedule the MRI first thing in the morning

## 2022-02-20 NOTE — Progress Notes (Signed)
  Subjective:  Patient ID: [name], female    DOB: [birth year],  MRN: [id number]  Chief Complaint  Patient presents with   Diabetes    Hospital follow up    61 y.o. female presents with the above complaint. History confirmed with patient.  Presents for follow-up on ulcer of right second toe she has completed the antibiotics that were given at the hospital says still quite painful and irritating for her.  Objective:  Physical Exam: warm, good capillary refill, no trophic changes or ulcerative lesions, normal DP and PT pulses, and full-thickness ulceration distal medial second toe, nail plate is loose with scar tissue, there is purulence deep to this.   Radiographs: Multiple views x-ray of the right foot: Prior x-rays taken 02/06/2022 showed possible osteomyelitis of second toe Assessment:   1. Ulcer of toe of right foot, with necrosis of bone (HCC)   2. Osteomyelitis of second toe of right foot (HCC)      Plan:  Patient was evaluated and treated and all questions answered.  Reviewed my clinical findings and the last radiographs from the ER, discussed with them that there is likely osteomyelitis of the second toe I recommend an MRI to evaluate this and discussed with her that she is likely facing partial or total amputation of the second toe pending results of the MRI.  Culture of the purulent drainage was taken today.  I placed her on doxycycline and she will follow-up in 1 week Dr. Allena Katz who is seen her before for her Charcot and this ulcer.  Change daily with mupirocin ointment which I prescribed for her as well.  Return in about 1 week (around 02/27/2022) for wound care, MRI review.

## 2022-02-21 ENCOUNTER — Telehealth: Payer: Self-pay | Admitting: *Deleted

## 2022-02-21 ENCOUNTER — Ambulatory Visit (HOSPITAL_COMMUNITY)
Admission: RE | Admit: 2022-02-21 | Discharge: 2022-02-21 | Disposition: A | Discharge status: Home / Self Care | Payer: Medicaid Other | Source: Ambulatory Visit | Attending: Podiatry | Admitting: Podiatry

## 2022-02-21 DIAGNOSIS — M869 Osteomyelitis, unspecified: Secondary | ICD-10-CM | POA: Insufficient documentation

## 2022-02-21 DIAGNOSIS — L97514 Non-pressure chronic ulcer of other part of right foot with necrosis of bone: Secondary | ICD-10-CM | POA: Diagnosis present

## 2022-02-21 NOTE — Telephone Encounter (Signed)
Patient has been pre authorized for cpt code 73718,#[id number],valid from 02/21/22-04/07/22,contact Arlys John V,contacted preservice center for scheduling.

## 2022-02-22 ENCOUNTER — Telehealth: Payer: Self-pay | Admitting: *Deleted

## 2022-02-22 MED ORDER — OXYCODONE HCL 5 MG PO TABS: 5.0000 mg | ORAL_TABLET | Freq: Four times a day (QID) | ORAL | 0 refills | Status: AC | PRN

## 2022-02-22 NOTE — Telephone Encounter (Signed)
Patient's son(Wayne) is calling because she was supposed to send 3 medications to pharmacy, only received 2, oxycodone is missing. She is in a lot of pain from visit,please advise or send to pharmacy on file.

## 2022-02-23 ENCOUNTER — Telehealth: Payer: Self-pay | Admitting: *Deleted

## 2022-02-23 NOTE — Telephone Encounter (Signed)
Called the pharmacy and they are out of the oxycodone 5 mg,they do have percocet 5-325 mg.Patient's son is aware. Please advise

## 2022-02-23 NOTE — Telephone Encounter (Signed)
Patient's son has been notified.

## 2022-02-24 LAB — WOUND CULTURE
MICRO NUMBER:: 13433763
SPECIMEN QUALITY:: ADEQUATE

## 2022-02-28 ENCOUNTER — Ambulatory Visit (INDEPENDENT_AMBULATORY_CARE_PROVIDER_SITE_OTHER): Payer: Medicaid Other | Admitting: Podiatry

## 2022-02-28 DIAGNOSIS — Z794 Long term (current) use of insulin: Secondary | ICD-10-CM

## 2022-02-28 DIAGNOSIS — E1142 Type 2 diabetes mellitus with diabetic polyneuropathy: Secondary | ICD-10-CM | POA: Diagnosis not present

## 2022-02-28 DIAGNOSIS — Z01818 Encounter for other preprocedural examination: Secondary | ICD-10-CM

## 2022-02-28 DIAGNOSIS — M869 Osteomyelitis, unspecified: Secondary | ICD-10-CM

## 2022-02-28 DIAGNOSIS — M86071 Acute hematogenous osteomyelitis, right ankle and foot: Secondary | ICD-10-CM | POA: Diagnosis not present

## 2022-03-01 ENCOUNTER — Encounter: Payer: Self-pay | Admitting: Podiatry

## 2022-03-01 ENCOUNTER — Telehealth: Payer: Self-pay | Admitting: Urology

## 2022-03-01 NOTE — Telephone Encounter (Signed)
DOS - 03/05/22  AMPUTATION 3RD RIGHT --- 30865  [employer] EFFECTIVE DATE - 03/31/21  PLAN DEDUCTIBLE - $0.00 OUT OF POCKET - Member's individual out-of-pocket maximum has no limit. COINSURANCE - 0%  COPAY - $0.00  RECEIVED FAX FROM [facility] STATING THAT CPT CODE 78469 HAS BEEN APPROVED, AUTH # [id number], GOOD FROM 03/05/22 - 06/03/22.

## 2022-03-01 NOTE — Progress Notes (Signed)
Subjective:  Patient ID: [name], female    DOB: Apr 01, 1961,  MRN: [id number]  Chief Complaint  Patient presents with   Wound Check    61 y.o. female presents with the above complaint.  Patient presents with right third digit osteomyelitis with a confirmed MRI that is little bit painful.  She states is painful to touch painful to walk on.  She was treated by Dr. Sherryle Lis and had ordered an MRI which confirms osteomyelitis of the third digit.  At this time I discussed with the patient that she will benefit from surgical removal of the third toe.  She states understanding would like to proceed with surgery.  She has failed all conservative treatment options.   Review of Systems: Negative except as noted in the HPI. Denies N/V/F/Ch.  Past Medical History:  Diagnosis Date   Diabetes mellitus without complication ([place])    Hidradenitis 12/13/2015   Hypercholesterolemia    Hypertension    Smoker 12/13/2015    Current Outpatient Medications:    ACCU-CHEK GUIDE test strip, USE TO TEST 3 TIMESDDAILY., Disp: , Rfl:    Accu-Chek Softclix Lancets lancets, 3 (three) times daily., Disp: , Rfl:    acetaminophen (TYLENOL) 325 MG tablet, Take 650 mg by mouth every 6 (six) hours as needed for mild pain., Disp: , Rfl:    ascorbic acid (VITAMIN C) 500 MG tablet, Take 1 tablet (500 mg total) by mouth daily., Disp: 30 tablet, Rfl: 0   blood glucose meter kit and supplies, Dispense based on patient and insurance preference. Use up to four times daily as directed. (FOR ICD-10 E10.9, E11.9)., Disp: 1 each, Rfl: 0   cholecalciferol (VITAMIN D3) 25 MCG (1000 UNIT) tablet, Take 1,000 Units by mouth daily., Disp: , Rfl:    doxycycline (VIBRA-TABS) 100 MG tablet, Take 1 tablet (100 mg total) by mouth 2 (two) times daily., Disp: 28 tablet, Rfl: 0   gabapentin (NEURONTIN) 100 MG capsule, Take 1 capsule (100 mg total) by mouth 3 (three) times daily., Disp: 90 capsule, Rfl: 3   glipiZIDE (GLUCOTROL) 5 MG  tablet, Take 5 mg by mouth 2 (two) times daily., Disp: , Rfl:    insulin glargine (LANTUS) 100 UNIT/ML Solostar Pen, Inject 10 Units into the skin at bedtime., Disp: 15 mL, Rfl: 1   Insulin Pen Needle 31G X 5 MM MISC, 1 Device by Does not apply route as directed., Disp: 100 each, Rfl: 1   lisinopril-hydrochlorothiazide (PRINZIDE,ZESTORETIC) 10-12.5 MG tablet, Take 1 tablet by mouth daily., Disp: , Rfl:    metFORMIN (GLUCOPHAGE) 850 MG tablet, Take 850 mg by mouth 2 (two) times daily., Disp: , Rfl:    mupirocin ointment (BACTROBAN) 2 %, Apply 1 application. topically daily., Disp: 30 g, Rfl: 2   Nutritional Supplements (,FEEDING SUPPLEMENT, PROSOURCE PLUS) liquid, Take 30 mLs by mouth 2 (two) times daily between meals., Disp: 887 mL, Rfl: 3   oxybutynin (DITROPAN-XL) 10 MG 24 hr tablet, Take 1 tablet (10 mg total) by mouth at bedtime., Disp: 30 tablet, Rfl: 12   PROAIR HFA 108 (90 Base) MCG/ACT inhaler, Inhale 2 puffs into the lungs every 4 (four) hours as needed for wheezing., Disp: , Rfl:    QUEtiapine (SEROQUEL) 200 MG tablet, Take 1 tablet (200 mg total) by mouth at bedtime., Disp: 30 tablet, Rfl: 0  Social History   Tobacco Use  Smoking Status Every Day   Packs/day: 0.50   Years: 34.00   Pack years: 17.00   Types: Cigarettes  Smokeless Tobacco Never    No Known Allergies Objective:  There were no vitals filed for this visit. There is no height or weight on file to calculate BMI. Constitutional Well developed. Well nourished.  Vascular Dorsalis pedis pulses palpable bilaterally. Posterior tibial pulses palpable bilaterally. Capillary refill normal to all digits.  No cyanosis or clubbing noted. Pedal hair growth normal.  Neurologic Normal speech. Oriented to person, place, and time. Epicritic sensation to light touch grossly present bilaterally.  Dermatologic Nails well groomed and normal in appearance. No open wounds. No skin lesions.  Orthopedic: Right third digit  ulceration probing down to deep bone tissue.  No purulent drainage noted no redness noted.  No malodor present.  It appears to be stable   Radiographs:  IMPRESSION: 1. Bone marrow edema of the middle and distal phalanx of the third digit which in the presence of adjacent skin wound is consistent with osteomyelitis.   2. Bone marrow edema of the distal phalanx of the first digit, concerning for osteomyelitis or reactive changes due to altered mechanics. Clinical correlation is suggested.   3. Skin irregularity about the third digit with surrounding inflammatory changes. Assessment:   1. Type 2 diabetes mellitus with diabetic polyneuropathy, with long-term current use of insulin ([place])   2. Osteomyelitis of third toe of right foot ([place])   3. Encounter for preoperative examination for general surgical procedure    Plan:  Patient was evaluated and treated and all questions answered.  Right third digit osteomyelitis -All questions and concerns were discussed with the patient in extensive detail.  At this time this can be done as an outpatient given that patient is stable from osteomyelitic standpoint.  She does not have any other clinical signs of infection.  I discussed my preoperative intraoperative postop plan in extensive detail she states understanding would like to proceed with surgery. -Informed surgical risk consent was reviewed and read aloud to the patient.  I reviewed the films.  I have discussed my findings with the patient in great detail.  I have discussed all risks including but not limited to infection, stiffness, scarring, limp, disability, deformity, damage to blood vessels and nerves, numbness, poor healing, need for braces, arthritis, chronic pain, amputation, death.  All benefits and realistic expectations discussed in great detail.  I have made no promises as to the outcome.  I have provided realistic expectations.  I have offered the patient a 2nd opinion, which they have  declined and assured me they preferred to proceed despite the risks   No follow-ups on file.

## 2022-03-05 ENCOUNTER — Encounter: Payer: Self-pay | Admitting: Podiatry

## 2022-03-05 ENCOUNTER — Other Ambulatory Visit: Payer: Self-pay | Admitting: Podiatry

## 2022-03-05 DIAGNOSIS — M86671 Other chronic osteomyelitis, right ankle and foot: Secondary | ICD-10-CM | POA: Diagnosis not present

## 2022-03-05 MED ORDER — OXYCODONE-ACETAMINOPHEN 5-325 MG PO TABS: 1.0000 | ORAL_TABLET | ORAL | 0 refills | Status: DC | PRN

## 2022-03-14 ENCOUNTER — Ambulatory Visit (INDEPENDENT_AMBULATORY_CARE_PROVIDER_SITE_OTHER): Payer: Medicaid Other

## 2022-03-14 ENCOUNTER — Ambulatory Visit: Payer: Medicaid Other | Admitting: Podiatry

## 2022-03-14 DIAGNOSIS — Z89421 Acquired absence of other right toe(s): Secondary | ICD-10-CM

## 2022-03-14 NOTE — Progress Notes (Signed)
Subjective:  Patient ID: [name], female    DOB: 14-Nov-1960,  MRN: [id number]  No chief complaint on file.   DOS: 03/05/2022 Procedure: Amputation of the right third digit partial  61 y.o. female returns for post-op check.  Patient states she is doing well.  No acute complaints.  No pain.  Review of Systems: Negative except as noted in the HPI. Denies N/V/F/Ch.  Past Medical History:  Diagnosis Date   Diabetes mellitus without complication ([place])    Hidradenitis 12/13/2015   Hypercholesterolemia    Hypertension    Smoker 12/13/2015    Current Outpatient Medications:    ACCU-CHEK GUIDE test strip, USE TO TEST 3 TIMESDDAILY., Disp: , Rfl:    Accu-Chek Softclix Lancets lancets, 3 (three) times daily., Disp: , Rfl:    acetaminophen (TYLENOL) 325 MG tablet, Take 650 mg by mouth every 6 (six) hours as needed for mild pain., Disp: , Rfl:    ascorbic acid (VITAMIN C) 500 MG tablet, Take 1 tablet (500 mg total) by mouth daily., Disp: 30 tablet, Rfl: 0   blood glucose meter kit and supplies, Dispense based on patient and insurance preference. Use up to four times daily as directed. (FOR ICD-10 E10.9, E11.9)., Disp: 1 each, Rfl: 0   cholecalciferol (VITAMIN D3) 25 MCG (1000 UNIT) tablet, Take 1,000 Units by mouth daily., Disp: , Rfl:    doxycycline (VIBRA-TABS) 100 MG tablet, Take 1 tablet (100 mg total) by mouth 2 (two) times daily., Disp: 28 tablet, Rfl: 0   gabapentin (NEURONTIN) 100 MG capsule, Take 1 capsule (100 mg total) by mouth 3 (three) times daily., Disp: 90 capsule, Rfl: 3   glipiZIDE (GLUCOTROL) 5 MG tablet, Take 5 mg by mouth 2 (two) times daily., Disp: , Rfl:    insulin glargine (LANTUS) 100 UNIT/ML Solostar Pen, Inject 10 Units into the skin at bedtime., Disp: 15 mL, Rfl: 1   Insulin Pen Needle 31G X 5 MM MISC, 1 Device by Does not apply route as directed., Disp: 100 each, Rfl: 1   lisinopril-hydrochlorothiazide (PRINZIDE,ZESTORETIC) 10-12.5 MG tablet, Take 1 tablet by  mouth daily., Disp: , Rfl:    metFORMIN (GLUCOPHAGE) 850 MG tablet, Take 850 mg by mouth 2 (two) times daily., Disp: , Rfl:    mupirocin ointment (BACTROBAN) 2 %, Apply 1 application. topically daily., Disp: 30 g, Rfl: 2   Nutritional Supplements (,FEEDING SUPPLEMENT, PROSOURCE PLUS) liquid, Take 30 mLs by mouth 2 (two) times daily between meals., Disp: 887 mL, Rfl: 3   oxybutynin (DITROPAN-XL) 10 MG 24 hr tablet, Take 1 tablet (10 mg total) by mouth at bedtime., Disp: 30 tablet, Rfl: 12   oxyCODONE-acetaminophen (PERCOCET) 5-325 MG tablet, Take 1 tablet by mouth every 4 (four) hours as needed for severe pain., Disp: 30 tablet, Rfl: 0   PROAIR HFA 108 (90 Base) MCG/ACT inhaler, Inhale 2 puffs into the lungs every 4 (four) hours as needed for wheezing., Disp: , Rfl:    QUEtiapine (SEROQUEL) 200 MG tablet, Take 1 tablet (200 mg total) by mouth at bedtime., Disp: 30 tablet, Rfl: 0  Social History   Tobacco Use  Smoking Status Every Day   Packs/day: 0.50   Years: 34.00   Total pack years: 17.00   Types: Cigarettes  Smokeless Tobacco Never    No Known Allergies Objective:  There were no vitals filed for this visit. There is no height or weight on file to calculate BMI. Constitutional Well developed. Well nourished.  Vascular Foot warm and  well perfused. Capillary refill normal to all digits.   Neurologic Normal speech. Oriented to person, place, and time. Epicritic sensation to light touch grossly present bilaterally.  Dermatologic Skin healing well without signs of infection. Skin edges well coapted without signs of infection.  Orthopedic: Tenderness to palpation noted about the surgical site.   Radiographs: 3 views of skeletally mature the right foot: Partial third digit amputation noted.  No signs of osteomyelitis noted no bone foreign body noted no soft tissue emphysema noted Assessment:   1. History of amputation of lesser toe of right foot ([place])    Plan:  Patient was  evaluated and treated and all questions answered.  S/p foot surgery right -Progressing as expected post-operatively. -XR: See above -WB Status: Weightbearing as tolerated in surgical shoe -Sutures: Intact.  No clinical signs of [place] is noted no complication noted. -Medications: None -Foot redressed.  No follow-ups on file.

## 2022-03-19 ENCOUNTER — Encounter: Payer: Self-pay | Admitting: Podiatry

## 2022-03-28 ENCOUNTER — Ambulatory Visit (INDEPENDENT_AMBULATORY_CARE_PROVIDER_SITE_OTHER): Payer: Medicaid Other | Admitting: Podiatry

## 2022-03-28 DIAGNOSIS — Z89421 Acquired absence of other right toe(s): Secondary | ICD-10-CM | POA: Diagnosis not present

## 2022-03-28 MED ORDER — OXYCODONE-ACETAMINOPHEN 5-325 MG PO TABS: 1.0000 | ORAL_TABLET | ORAL | 0 refills | Status: DC | PRN

## 2022-04-04 NOTE — Progress Notes (Signed)
Subjective:  Patient ID: [name], female    DOB: Jul 16, 1961,  MRN: [id number]  Chief Complaint  Patient presents with   Routine Post Op    POV #2 DOS 03/05/2022 RT 3RD DIGIT AMPUTATION    DOS: 03/05/2022 Procedure: Amputation of the right third digit partial  61 y.o. female returns for post-op check.  Patient states she is doing well.  No acute complaints.  No pain.  Review of Systems: Negative except as noted in the HPI. Denies N/V/F/Ch.  Past Medical History:  Diagnosis Date   Diabetes mellitus without complication ([place])    Hidradenitis 12/13/2015   Hypercholesterolemia    Hypertension    Smoker 12/13/2015    Current Outpatient Medications:    oxyCODONE-acetaminophen (PERCOCET) 5-325 MG tablet, Take 1 tablet by mouth every 4 (four) hours as needed for severe pain., Disp: 30 tablet, Rfl: 0   ACCU-CHEK GUIDE test strip, USE TO TEST 3 TIMESDDAILY., Disp: , Rfl:    Accu-Chek Softclix Lancets lancets, 3 (three) times daily., Disp: , Rfl:    acetaminophen (TYLENOL) 325 MG tablet, Take 650 mg by mouth every 6 (six) hours as needed for mild pain., Disp: , Rfl:    ascorbic acid (VITAMIN C) 500 MG tablet, Take 1 tablet (500 mg total) by mouth daily., Disp: 30 tablet, Rfl: 0   blood glucose meter kit and supplies, Dispense based on patient and insurance preference. Use up to four times daily as directed. (FOR ICD-10 E10.9, E11.9)., Disp: 1 each, Rfl: 0   cholecalciferol (VITAMIN D3) 25 MCG (1000 UNIT) tablet, Take 1,000 Units by mouth daily., Disp: , Rfl:    doxycycline (VIBRA-TABS) 100 MG tablet, Take 1 tablet (100 mg total) by mouth 2 (two) times daily., Disp: 28 tablet, Rfl: 0   gabapentin (NEURONTIN) 100 MG capsule, Take 1 capsule (100 mg total) by mouth 3 (three) times daily., Disp: 90 capsule, Rfl: 3   glipiZIDE (GLUCOTROL) 5 MG tablet, Take 5 mg by mouth 2 (two) times daily., Disp: , Rfl:    insulin glargine (LANTUS) 100 UNIT/ML Solostar Pen, Inject 10 Units into the skin at  bedtime., Disp: 15 mL, Rfl: 1   Insulin Pen Needle 31G X 5 MM MISC, 1 Device by Does not apply route as directed., Disp: 100 each, Rfl: 1   lisinopril-hydrochlorothiazide (PRINZIDE,ZESTORETIC) 10-12.5 MG tablet, Take 1 tablet by mouth daily., Disp: , Rfl:    metFORMIN (GLUCOPHAGE) 850 MG tablet, Take 850 mg by mouth 2 (two) times daily., Disp: , Rfl:    mupirocin ointment (BACTROBAN) 2 %, Apply 1 application. topically daily., Disp: 30 g, Rfl: 2   Nutritional Supplements (,FEEDING SUPPLEMENT, PROSOURCE PLUS) liquid, Take 30 mLs by mouth 2 (two) times daily between meals., Disp: 887 mL, Rfl: 3   oxybutynin (DITROPAN-XL) 10 MG 24 hr tablet, Take 1 tablet (10 mg total) by mouth at bedtime., Disp: 30 tablet, Rfl: 12   oxyCODONE-acetaminophen (PERCOCET) 5-325 MG tablet, Take 1 tablet by mouth every 4 (four) hours as needed for severe pain., Disp: 30 tablet, Rfl: 0   PROAIR HFA 108 (90 Base) MCG/ACT inhaler, Inhale 2 puffs into the lungs every 4 (four) hours as needed for wheezing., Disp: , Rfl:    QUEtiapine (SEROQUEL) 200 MG tablet, Take 1 tablet (200 mg total) by mouth at bedtime., Disp: 30 tablet, Rfl: 0  Social History   Tobacco Use  Smoking Status Every Day   Packs/day: 0.50   Years: 34.00   Total pack years: 17.00  Types: Cigarettes  Smokeless Tobacco Never    No Known Allergies Objective:  There were no vitals filed for this visit. There is no height or weight on file to calculate BMI. Constitutional Well developed. Well nourished.  Vascular Foot warm and well perfused. Capillary refill normal to all digits.   Neurologic Normal speech. Oriented to person, place, and time. Epicritic sensation to light touch grossly present bilaterally.  Dermatologic Skin completely reepithelialized.  Good correction alignment noted.  No worsening of wound or infection noted  Orthopedic: No further tenderness to palpation noted about the surgical site.   Radiographs: 3 views of skeletally mature  the right foot: Partial third digit amputation noted.  No signs of osteomyelitis noted no bone foreign body noted no soft tissue emphysema noted Assessment:   1. History of amputation of lesser toe of right foot ([place])     Plan:  Patient was evaluated and treated and all questions answered.  S/p foot surgery right -Clinically healed and officially discharged from my care if any foot and ankle issues on future of asked her to come to see me.  I discussed shoe gear modification and offloading management.  No follow-ups on file.

## 2022-04-18 ENCOUNTER — Ambulatory Visit (INDEPENDENT_AMBULATORY_CARE_PROVIDER_SITE_OTHER): Payer: Medicaid Other | Admitting: Podiatry

## 2022-04-18 DIAGNOSIS — Z89421 Acquired absence of other right toe(s): Secondary | ICD-10-CM | POA: Diagnosis not present

## 2022-04-18 NOTE — Progress Notes (Signed)
Subjective:  Patient ID: [name], female    DOB: 04/04/1961,  MRN: [id number]  Chief Complaint  Patient presents with   Follow-up    6 month f/u on irrigation and debridement foot amp. 2nd toe-denies any pain, numbness, tingling     DOS: 03/05/2022 Procedure: Amputation of the right third digit partial  61 y.o. female returns for post-op check.  Patient states she is doing well.  No acute complaints.  No pain.  Review of Systems: Negative except as noted in the HPI. Denies N/V/F/Ch.  Past Medical History:  Diagnosis Date   Diabetes mellitus without complication ([place])    Hidradenitis 12/13/2015   Hypercholesterolemia    Hypertension    Smoker 12/13/2015    Current Outpatient Medications:    ACCU-CHEK GUIDE test strip, USE TO TEST 3 TIMESDDAILY., Disp: , Rfl:    Accu-Chek Softclix Lancets lancets, 3 (three) times daily., Disp: , Rfl:    acetaminophen (TYLENOL) 325 MG tablet, Take 650 mg by mouth every 6 (six) hours as needed for mild pain., Disp: , Rfl:    ascorbic acid (VITAMIN C) 500 MG tablet, Take 1 tablet (500 mg total) by mouth daily., Disp: 30 tablet, Rfl: 0   atorvastatin (LIPITOR) 40 MG tablet, Take 40 mg by mouth daily., Disp: , Rfl:    blood glucose meter kit and supplies, Dispense based on patient and insurance preference. Use up to four times daily as directed. (FOR ICD-10 E10.9, E11.9)., Disp: 1 each, Rfl: 0   cholecalciferol (VITAMIN D3) 25 MCG (1000 UNIT) tablet, Take 1,000 Units by mouth daily., Disp: , Rfl:    doxycycline (VIBRA-TABS) 100 MG tablet, Take 1 tablet (100 mg total) by mouth 2 (two) times daily., Disp: 28 tablet, Rfl: 0   gabapentin (NEURONTIN) 100 MG capsule, Take 1 capsule (100 mg total) by mouth 3 (three) times daily., Disp: 90 capsule, Rfl: 3   glipiZIDE (GLUCOTROL) 5 MG tablet, Take 5 mg by mouth 2 (two) times daily., Disp: , Rfl:    insulin glargine (LANTUS) 100 UNIT/ML Solostar Pen, Inject 10 Units into the skin at bedtime., Disp: 15 mL,  Rfl: 1   Insulin Pen Needle 31G X 5 MM MISC, 1 Device by Does not apply route as directed., Disp: 100 each, Rfl: 1   lisinopril-hydrochlorothiazide (PRINZIDE,ZESTORETIC) 10-12.5 MG tablet, Take 1 tablet by mouth daily., Disp: , Rfl:    metFORMIN (GLUCOPHAGE) 850 MG tablet, Take 850 mg by mouth 2 (two) times daily., Disp: , Rfl:    mupirocin ointment (BACTROBAN) 2 %, Apply 1 application. topically daily., Disp: 30 g, Rfl: 2   Nutritional Supplements (,FEEDING SUPPLEMENT, PROSOURCE PLUS) liquid, Take 30 mLs by mouth 2 (two) times daily between meals., Disp: 887 mL, Rfl: 3   oxybutynin (DITROPAN-XL) 10 MG 24 hr tablet, Take 1 tablet (10 mg total) by mouth at bedtime., Disp: 30 tablet, Rfl: 12   oxyCODONE-acetaminophen (PERCOCET) 5-325 MG tablet, Take 1 tablet by mouth every 4 (four) hours as needed for severe pain., Disp: 30 tablet, Rfl: 0   oxyCODONE-acetaminophen (PERCOCET) 5-325 MG tablet, Take 1 tablet by mouth every 4 (four) hours as needed for severe pain., Disp: 30 tablet, Rfl: 0   PROAIR HFA 108 (90 Base) MCG/ACT inhaler, Inhale 2 puffs into the lungs every 4 (four) hours as needed for wheezing., Disp: , Rfl:    QUEtiapine (SEROQUEL) 200 MG tablet, Take 1 tablet (200 mg total) by mouth at bedtime., Disp: 30 tablet, Rfl: 0  Social History  Tobacco Use  Smoking Status Every Day   Packs/day: 0.50   Years: 34.00   Total pack years: 17.00   Types: Cigarettes  Smokeless Tobacco Never    No Known Allergies Objective:  There were no vitals filed for this visit. There is no height or weight on file to calculate BMI. Constitutional Well developed. Well nourished.  Vascular Foot warm and well perfused. Capillary refill normal to all digits.   Neurologic Normal speech. Oriented to person, place, and time. Epicritic sensation to light touch grossly present bilaterally.  Dermatologic Skin completely reepithelialized.  Good correction alignment noted.  No worsening of wound or infection  noted  Orthopedic: No further tenderness to palpation noted about the surgical site.   Radiographs: 3 views of skeletally mature the right foot: Partial third digit amputation noted.  No signs of osteomyelitis noted no bone foreign body noted no soft tissue emphysema noted Assessment:   1. History of amputation of lesser toe of right foot ([place])      Plan:  Patient was evaluated and treated and all questions answered.  S/p foot surgery right -Clinically healed and officially discharged from my care if any foot and ankle issues on future of asked her to come to see me.  I discussed shoe gear modification and offloading management.  No follow-ups on file.

## 2022-08-17 ENCOUNTER — Encounter: Payer: Self-pay | Admitting: *Deleted

## 2023-03-05 ENCOUNTER — Ambulatory Visit: Payer: Medicaid Other | Admitting: Adult Health

## 2023-03-05 ENCOUNTER — Ambulatory Visit (INDEPENDENT_AMBULATORY_CARE_PROVIDER_SITE_OTHER): Payer: Medicaid Other | Admitting: Adult Health

## 2023-03-05 ENCOUNTER — Other Ambulatory Visit (HOSPITAL_COMMUNITY): Payer: Self-pay | Admitting: Adult Health

## 2023-03-05 ENCOUNTER — Encounter: Payer: Self-pay | Admitting: Adult Health

## 2023-03-05 VITALS — BP 122/75 | HR 88 | Ht 65.0 in | Wt 149.0 lb

## 2023-03-05 DIAGNOSIS — Z1211 Encounter for screening for malignant neoplasm of colon: Secondary | ICD-10-CM | POA: Diagnosis not present

## 2023-03-05 DIAGNOSIS — Z01419 Encounter for gynecological examination (general) (routine) without abnormal findings: Secondary | ICD-10-CM

## 2023-03-05 DIAGNOSIS — Z1231 Encounter for screening mammogram for malignant neoplasm of breast: Secondary | ICD-10-CM | POA: Diagnosis not present

## 2023-03-05 DIAGNOSIS — F17200 Nicotine dependence, unspecified, uncomplicated: Secondary | ICD-10-CM

## 2023-03-05 LAB — HEMOCCULT GUIAC POC 1CARD (OFFICE): Fecal Occult Blood, POC: NEGATIVE

## 2023-03-05 NOTE — Progress Notes (Signed)
Patient ID: [name], female   DOB: 04-Aug-1961, 62 y.o.   MRN: [id number] History of Present Illness: [name] is a 62 year old white female,divorced,PM in for a well woman gyn exam.  Last pap was negative HPV, NILM 02/02/21.   Current Medications, Allergies, Past Medical History, Past Surgical History, Family History and Social History were reviewed in [employer] record.     Review of Systems: Patient denies any headaches, hearing loss, fatigue, blurred vision, shortness of breath, chest pain, abdominal pain, problems with bowel movements, urination, or intercourse.(Not active). No joint pain or mood swings.  Denies any bleeding   Physical Exam:BP 122/75 (BP Location: Left Arm, Patient Position: Sitting, Cuff Size: Normal)   Pulse 88   Ht 5\' 5"  (1.651 m)   Wt 149 lb (67.6 kg)   BMI 24.79 kg/m   General:  Well developed, well nourished, no acute distress,smells of smoke and nails yellowed  Skin:  Warm and dry Neck:  Midline trachea, normal thyroid, good ROM, no lymphadenopathy,no carotid bruits heard Lungs: some wheezing noted on  auscultation bilaterally, clears if coughs  Breast:  No dominant palpable mass, retraction, or nipple discharge Cardiovascular: Regular rate and rhythm Abdomen:  Soft, non tender, no hepatosplenomegaly Pelvic:  External genitalia is normal in appearance, no lesions.  The vagina is pale and atrophic. Urethra has no lesions or masses. The cervix is smooth.  Uterus is felt to be normal size, shape, and contour.  No adnexal masses or tenderness noted.Bladder is non tender, no masses felt. Rectal: Good sphincter tone, no polyps, or hemorrhoids felt.  Hemoccult negative. Extremities/musculoskeletal:  No swelling or varicosities noted, no clubbing or cyanosis Psych:  No mood changes, alert and cooperative,seems happy AA is 0 Fall risk is low, has boot on left foot    03/05/2023   10:39 AM 02/05/2022   10:27 AM 02/02/2021   10:36 AM   Depression screen PHQ 2/9  Decreased Interest 0 0 0  Down, Depressed, Hopeless 0 0 0  PHQ - 2 Score 0 0 0  Altered sleeping 0  3  Tired, decreased energy 0  0  Change in appetite 0  0  Feeling bad or failure about yourself  0  0  Trouble concentrating 0  0  Moving slowly or fidgety/restless 0  0  Suicidal thoughts 0  0  PHQ-9 Score 0  3       03/05/2023   10:40 AM 02/05/2022   10:27 AM 02/02/2021   10:36 AM  GAD 7 : Generalized Anxiety Score  Nervous, Anxious, on Edge 0 0 0  Control/stop worrying 0 0 0  Worry too much - different things 0 0 0  Trouble relaxing 0 0 0  Restless 0 0 0  Easily annoyed or irritable 0 0 0  Afraid - awful might happen 0 0 0  Total GAD 7 Score 0 0 0      Upstream - 03/05/23 1039       Pregnancy Intention Screening   Does the patient want to become pregnant in the next year? N/A    Does the patient's partner want to become pregnant in the next year? N/A    Would the patient like to discuss contraceptive options today? N/A      Contraception Wrap Up   Current Method Abstinence    End Method Abstinence    Contraception Counseling Provided No            Examination chaperoned  by Malachy Mood LPN   Impression and Plan: 1. Encounter for annual routine gynecological examination Pap and physical in 1 year Labs with PCP  2. Encounter for screening fecal occult blood testing Hemoccult was negative  - POCT occult blood stool  3. Smoker She declines to stop smoking at present   4. Screening mammogram for breast cancer Mammogram scheduled for her 03/13/23 at 10:45 at [facility]  - MM 3D SCREENING MAMMOGRAM BILATERAL BREAST; Future

## 2023-03-13 ENCOUNTER — Ambulatory Visit (HOSPITAL_COMMUNITY)
Admission: RE | Admit: 2023-03-13 | Discharge: 2023-03-13 | Disposition: A | Discharge status: Home / Self Care | Payer: Medicaid Other | Source: Ambulatory Visit | Attending: Adult Health | Admitting: Adult Health

## 2023-03-13 ENCOUNTER — Encounter (HOSPITAL_COMMUNITY): Payer: Self-pay

## 2023-03-13 DIAGNOSIS — Z1231 Encounter for screening mammogram for malignant neoplasm of breast: Secondary | ICD-10-CM | POA: Insufficient documentation

## 2023-10-29 ENCOUNTER — Other Ambulatory Visit (HOSPITAL_COMMUNITY): Payer: Self-pay | Admitting: Internal Medicine

## 2023-10-29 DIAGNOSIS — Z1231 Encounter for screening mammogram for malignant neoplasm of breast: Secondary | ICD-10-CM

## 2023-11-05 ENCOUNTER — Other Ambulatory Visit: Payer: Self-pay

## 2023-11-05 DIAGNOSIS — I10 Essential (primary) hypertension: Secondary | ICD-10-CM

## 2023-11-07 ENCOUNTER — Telehealth: Payer: Self-pay

## 2023-11-07 NOTE — Progress Notes (Signed)
   11/07/2023  Patient ID: [name], female   DOB: 27-Dec-1960, 63 y.o.   MRN: [id number]  Contacted patient regarding referral for medication management from Fanta, Tesfaye Demissie, MD .   Patient declined to speak to me and refused a medication review once I had her on the phone. I offered to call back at a more convenient time, but the patient declined that as well.   I will be happy to help the patient if she or her PCP request a new referral for medication management in the future.    Heather Factor, PharmD Clinical Pharmacist  [phone]

## 2024-03-12 ENCOUNTER — Ambulatory Visit (HOSPITAL_COMMUNITY)
Admission: RE | Admit: 2024-03-12 | Discharge: 2024-03-12 | Disposition: A | Discharge status: Home / Self Care | Source: Ambulatory Visit | Attending: Internal Medicine | Admitting: Internal Medicine

## 2024-03-12 DIAGNOSIS — Z1231 Encounter for screening mammogram for malignant neoplasm of breast: Secondary | ICD-10-CM | POA: Insufficient documentation

## 2024-03-13 ENCOUNTER — Ambulatory Visit (HOSPITAL_COMMUNITY): Payer: Medicaid Other

## 2024-03-25 ENCOUNTER — Other Ambulatory Visit (HOSPITAL_COMMUNITY)
Admission: RE | Admit: 2024-03-25 | Discharge: 2024-03-25 | Disposition: A | Discharge status: Home / Self Care | Source: Ambulatory Visit | Attending: Adult Health | Admitting: Adult Health

## 2024-03-25 ENCOUNTER — Encounter: Payer: Self-pay | Admitting: Adult Health

## 2024-03-25 ENCOUNTER — Ambulatory Visit: Admitting: Adult Health

## 2024-03-25 VITALS — BP 103/67 | HR 98 | Ht 65.0 in | Wt 151.5 lb

## 2024-03-25 DIAGNOSIS — Z0000 Encounter for general adult medical examination without abnormal findings: Secondary | ICD-10-CM | POA: Diagnosis present

## 2024-03-25 DIAGNOSIS — Z1211 Encounter for screening for malignant neoplasm of colon: Secondary | ICD-10-CM | POA: Diagnosis not present

## 2024-03-25 DIAGNOSIS — F17200 Nicotine dependence, unspecified, uncomplicated: Secondary | ICD-10-CM | POA: Diagnosis not present

## 2024-03-25 LAB — HEMOCCULT GUIAC POC 1CARD (OFFICE): Fecal Occult Blood, POC: NEGATIVE

## 2024-03-25 NOTE — Progress Notes (Signed)
 Patient ID: [name], female   DOB: 1961-08-25, 63 y.o.   MRN: [id number] History of Present Illness: [name] is a 63 year old white female, divorced, PM in for well woman gyn exam and pap.  PCP is Dr [name]   Current Medications, Allergies, Past Medical History, Past Surgical History, Family History and Social History were reviewed in [employer] electronic medical record.     Review of Systems: Patient denies any headaches, hearing loss, fatigue, blurred vision, shortness of breath, chest pain, abdominal pain, problems with bowel movements, urination, or intercourse.(Not active). No joint pain or mood swings.  Denies any vaginal bleeding    Physical Exam:BP 103/67 (BP Location: Left Arm, Patient Position: Sitting, Cuff Size: Normal)   Pulse 98   Ht 5' 5 (1.651 m)   Wt 151 lb 8 oz (68.7 kg)   BMI 25.21 kg/m   General:  Well developed, well nourished, no acute distress Skin:  Warm and dry Neck:  Midline trachea, normal thyroid, good ROM, no lymphadenopathy,no carotid bruits heard  Lungs; has bilateral wheezes that clears with cough Breast:  No dominant palpable mass, retraction, or nipple discharge, has multiple comedomes  Cardiovascular: Regular rate and rhythm Abdomen:  Soft, non tender, no hepatosplenomegaly Pelvic:  External genitalia is normal in appearance, no lesions.  The vagina is normal is pale. Urethra has no lesions or masses. The cervix is smooth, pap with HR HPV genotyping performed.  Uterus is felt to be normal size, shape, and contour.  No adnexal masses or tenderness noted.Bladder is non tender, no masses felt. Rectal: Good sphincter tone, no polyps, or hemorrhoids felt.  Hemoccult negative. Extremities/musculoskeletal:  No swelling or varicosities noted, no clubbing or cyanosis, has boot on left foot Psych:  No mood changes, alert and cooperative,seems happy Pt declines PHQ and GAD screening Fall risk is low  Upstream - 03/25/24 1018       Pregnancy  Intention Screening   Does the patient want to become pregnant in the next year? N/A    Does the patient's partner want to become pregnant in the next year? N/A    Would the patient like to discuss contraceptive options today? N/A      Contraception Wrap Up   Current Method Abstinence   PM   End Method Abstinence   PM   Contraception Counseling Provided No          Examination chaperoned by Alyssa Salt LPN  Impression and plan: 1. Routine general medical examination at a health care facility (Primary) Pap sent Pap in 3 years if negative Physical in 1 year Mammogram was negative 03/12/24  Colonoscopy per GI Labs with PCP  - Cytology - PAP( [name])  2. Encounter for screening fecal occult blood testing Hemoccult was negative   3. Smoker Smokes 1/2 pack per day, not ready to stop

## 2024-03-31 ENCOUNTER — Ambulatory Visit: Payer: Self-pay | Admitting: Adult Health

## 2024-03-31 LAB — CYTOLOGY - PAP
Adequacy: ABSENT
Comment: NEGATIVE
Diagnosis: REACTIVE
High risk HPV: NEGATIVE

## 2024-03-31 NOTE — Telephone Encounter (Signed)
-----   Message from [place] sent at 03/31/2024  8:40 AM EDT ----- Let her know pap was negative for HPV and malignancy. THX

## 2024-03-31 NOTE — Telephone Encounter (Signed)
Pt aware pap was negative for HPV and malignancy. Next pap due in 3 years. Pt voiced understanding. JSY 

## 2024-03-31 NOTE — Telephone Encounter (Signed)
 Call can't be completed @ 9:11 am. JSY

## 2024-11-04 ENCOUNTER — Other Ambulatory Visit (HOSPITAL_COMMUNITY): Payer: Self-pay | Admitting: Gerontology

## 2024-11-04 DIAGNOSIS — Z87891 Personal history of nicotine dependence: Secondary | ICD-10-CM
